# Patient Record
Sex: Female | Born: 1985 | Race: White | Hispanic: No | Marital: Single
Health system: Midwestern US, Academic
[De-identification: ages and names within clinical notes are randomized; demographics above are authoritative.]

## PROBLEM LIST (undated history)

## (undated) DIAGNOSIS — Z803 Family history of malignant neoplasm of breast: Secondary | ICD-10-CM

## (undated) DIAGNOSIS — E559 Vitamin D deficiency, unspecified: Secondary | ICD-10-CM

## (undated) DIAGNOSIS — B019 Varicella without complication: Secondary | ICD-10-CM

## (undated) DIAGNOSIS — G43909 Migraine, unspecified, not intractable, without status migrainosus: Secondary | ICD-10-CM

## (undated) DIAGNOSIS — K219 Gastro-esophageal reflux disease without esophagitis: Secondary | ICD-10-CM

## (undated) DIAGNOSIS — F329 Major depressive disorder, single episode, unspecified: Secondary | ICD-10-CM

## (undated) DIAGNOSIS — F419 Anxiety disorder, unspecified: Secondary | ICD-10-CM

## (undated) DIAGNOSIS — F32A Depression, unspecified: Secondary | ICD-10-CM

## (undated) DIAGNOSIS — Z800 Family history of malignant neoplasm of digestive organs: Secondary | ICD-10-CM

## (undated) DIAGNOSIS — M1990 Unspecified osteoarthritis, unspecified site: Secondary | ICD-10-CM

## (undated) HISTORY — DX: Varicella without complication: B01.9

## (undated) HISTORY — DX: Depression, unspecified: F32.A

## (undated) HISTORY — DX: Vitamin D deficiency, unspecified: E55.9

## (undated) HISTORY — DX: Anxiety disorder, unspecified: F41.9

## (undated) HISTORY — DX: Major depressive disorder, single episode, unspecified: F32.9

## (undated) HISTORY — DX: Family history of malignant neoplasm of digestive organs: Z80.0

## (undated) HISTORY — DX: Family history of malignant neoplasm of breast: Z80.3

## (undated) HISTORY — DX: Migraine, unspecified, not intractable, without status migrainosus: G43.909

## (undated) HISTORY — PX: WISDOM TOOTH EXTRACTION: SHX21

---

## 2009-04-29 ENCOUNTER — Observation Stay: Payer: Self-pay

## 2009-05-20 ENCOUNTER — Inpatient Hospital Stay (HOSPITAL_COMMUNITY): Admission: AD | Admit: 2009-05-20 | Discharge: 2009-05-21 | Payer: Self-pay | Admitting: Obstetrics and Gynecology

## 2009-06-01 ENCOUNTER — Inpatient Hospital Stay: Payer: Self-pay

## 2009-11-24 LAB — OFFICE VISIT LAB RESULTS

## 2009-11-24 NOTE — Unmapped (Signed)
Signed by Paulla Dolly RN on 11/24/2009 at 18:19:54    PHONE  NNOB        Allergies  Allergies have not been documented for this patient    Pain:     Have you had pain other than everyday aches and pains (e.g., mild headache, back ache, strains) in the past week?   No    Intake recorded by: Paulla Dolly RN on November 24, 2009 9:54 AM     Communication Sensory   Primary Language English     Functional Screen/Fall Risk Assessment   In the past two months, patient has experienced:  1.  A decreased ability to walk, turn in bed, get in/out of a chair? No  2.  Decreased ability to care for self, perform routine tasks? No  3.  Recent problem with coordination/movement or loss of balance? No  4.  Use of ambulation device such as walker/cane or crutches? No  5.  Weakness, dizziness, shortness of breath, fatigue with activity? No  6.  Recent frequent history of falling? No  7.  Do you exercise? Yes  How often? walking     Nutritional Screen   1.  Do you eat a special diet or meal plan? No2.  Have you had a recent weight gain or loss of 10 lbs (in the past 2 months?) No  3.  Do you have a difficulty eating, chewing, swallowing or speaking? No     Emotional Psychosocial Spiritual   1.  Do you have any spiritual, religious or cultural rituals that we need to be aware of? No  3.  Do you have current, recent thoughts that you would be better off dead or of hurting yourself? No  6.  Do you have adequate resources/medications? Yes  8.  Household: Other-FIANCEE AND 3 CHILDREN     Abuse/Neglect   Due to the increase in domestic violence, we ask all patients:  1.  Have you recently been threatened, frightened, mistreated, hurt or hit by anyone in your life? No  2.  Have you had money or other items taken from you without your permission? No  3.  If yes, to any of the above, do you want help? No      Educational Knowledge   Understanding of current health problems: Knowledgeable  Learning Preference: Listening, Reading, Seeing,  Doing    Referrals    Given social worker name and phone number  Questionnaire completed: 11/24/2009    Signed by:  Paulla Dolly RN on November 24, 2009 9:59 AM        Menses History:   [facility] Delivery[facility]  First day LMP: 09/22/2009  This date is: definite  Menses monthly: yes  Frequency:  32 days  Menarche: 10  LMP duration/flow: typical  Contraception used at conception: None  Planned pregnancy: no  Pregnancy confirmed by: home pregnancy test  G: 4  P 3:     Term: 3   Preterm: 0   Ab: 0   # Living Children: 3  SAB: 0  EAB: 0  ECT: 0  Past History  Surgical History:  2-Wisdom teeth removed 2010, Cesarean Section: x3    Family History: MGM - Heart Dz  passed HTN,aorta valve problem,Altermeizers  MGF - Aneurysim Brain,HTN passed away  Aunt - 1-pSYCH ISSUES  Uncle - 2- have had Open Heart Surgery 2- HTN  Social History: Preferred Language: English,   Marital Status: single,   Spouse: FOB Mark  Davis  Children: 3,   Employment Status: homemaker,   Patient Lives at: apartment,   Support System: excellent  Exercise: walking,   Caffeine per Day: <1  Seatbelt Use: 100 % of the time  Alcohol Use: none  Drug Use: none  Tobacco Usage:prior smoker  Sexually Active: Yes      Other Pertinent History:   Abuse: No  Pre-Pregnacy Tobacco Use per Day: 0  Pregnancy Tobacco Use per Day: 0  Pre-Pregnacy Alcohol Use per Day: 0  Pregnancy Alcohol Use per Day: 0  Pre-Pregnancy Illicit/Recreational Drug Use per Day: 0  Pregnancy Illicit/Recreational Drug Use per Day: 0  D(Rh) Sensitized: No  Latex Allergy: No  Betadine Allergy: No  Anesthetic Complications No  History of Abnormal Pap: No  Uterine Anomaly: No  History of Infertility: No  DES Exposure: No  ART Treatment: No      Infection History:   Exposed to TB: no   History of Genital Herpes: no   Sexual partner w/hx of oral or genital HSV: no  Rash or Viral Illness since LMP: no  Hepatitis B: no  Vaccinated for Hepatits B: no  Hepatitis C: no  History of Varicella Vaccine:  no  History of Varicella Infection: no  History of STD: no  History of PID: no  Do you care for any cats: no    Genetic Testing/Teratology Counseling:   Patient's Age>= 35 Years as of Estimated Date of Delivery: no  Thalassemia Risk: no  Neural Tube Defect: no  Congenital Heart Defect: no   Down Syndrome: no  Tay-Sachs: no  Canavan Disease: no  Familial Dysautonomia: no  Sickle Cell Disease/Trait: no  Hemophilia/Blood Disorders: no  Muscular Dystrophy: no  Cystic Fibrosis: no  Huntington's Chorea: no  Mental Retardation/Autism no  Tested for Fragile X: no  Other Genetic or Chromosomal: no  Maternal Metabolic Disorder: no  Other birth defects: no  Recurrent Pregnancy Loss/Stillbirth: no  Medications/Drugs/Alcohol since LMP: no  Previous Genetic testing for Cystic Fibrosis: no  Previous Genetic testing father of baby: no    Past Pregnancies:  G:  4 P:  3 A:  0  SAB: 0  EAB:  0  Term Deliveries:  3  Premature Deliveries:   Ectopic pregnancies:    H/O Multi-fetal Delivery:      Preg # Date Early Loss/ Termination GA Wks Lgth of Labor Brth Wt Sex Type of Del Anes Pl of Del PTL Fetal Comp Matrnl Comp Cmnts   1 03/04/02  FT 16HRS 7-4 F c-section epidural [employer] yes/brethine none No progress    2 12/15/03  39  8-6 M c-section spinal [employer] no none none    3 76/6/09  36  6-1 F c-section spinal [address] yes none none    4 current                     EDD:   LMP: 09/22/2009          Assessment   24 Years Old Female      G: 4  P 3:     Term: 3   Preterm: 0   Ab: 0   # Living Children: 3  SAB: 0  EAB: 0  ECT: 0  New Problems:   PREVIOUS C-SECT DELIVERY X3 (CBJ-628.31)  PREGNANCY, MULTIGRAVIDA (ICD-V22.1)        Patient Education   Education was provided to: patient  1st Trimester Education:  HIV/Prenatal tests, prenatal history risk factors, course of prenatal care, exercise/nutrition/wt gain, toxoplasmosis (cat/raw meat), sexual activity, exercise, smoking counseling, environment/work hazards, travel/seatbelts,  tobacco/alcohol/drugs, use of medications, indications for ultrasound, domestic violence, childbirth classes, hospital facilities        RN  Intake.  86yr G P Ab with LMP 09/22/09 approx 9wks today  EDD 06/29/10  Hx C/Sx3 will obtain Records when pt comes for Visit  and sign ROR.  RN Intake Forms Completed.  Danger Signs of Pregnancy reviewed  Food high in Calcium,  Heartburn,Nausea and Vomiting  Normal Growth of the Baby   Video taping,[facility] Visiting Policy and   Welcome to [facility]  given to pt.  All Prenatal Labs,Pap and Cultures at Provider  visit   scheduled 11/25/09 8.00am  Emergency phone #s and Location of L&D map given  Questions answered.   ..................................................................Marland KitchenPaulla Dolly RN  November 24, 2009 6:12 PM           Patient Education   Education was provided to: patient  Patient Response: Expressed understanding  1st Trimester Education:  HIV/Prenatal tests, prenatal history risk factors, course of prenatal care, exercise/nutrition/wt gain, toxoplasmosis (cat/raw meat), sexual activity, exercise, smoking counseling, environment/work hazards, travel/seatbelts, tobacco/alcohol/drugs, use of medications, indications for ultrasound, domestic violence, childbirth classes, hospital facilities

## 2009-11-24 NOTE — Unmapped (Signed)
Signed by Maris Berger CNM on 11/24/2009 at 00:00:00  To Receive Records FR: [facility] (REQUEST ONLY)      Imported By: Georgianne Fick 11/25/2009 18:16:03    _____________________________________________________________________    External Attachment:    Please see [facility] EMR for this document.

## 2009-11-24 NOTE — Unmapped (Signed)
Signed by Maris Berger CNM on 11/24/2009 at 00:00:00  [facility] AND VIDEOTAPING POLICY       Imported By: Georgianne Fick 11/30/2009 16:52:52    _____________________________________________________________________    External Attachment:    Please see [facility] EMR for this document.

## 2009-11-25 ENCOUNTER — Inpatient Hospital Stay

## 2009-11-25 LAB — HEMOGLOBINOPATHY EVALUATION
Hgb A2 Quant: 2.2 % (ref 0.7–3.1)
Hgb A: 97.8 % (ref 94.0–98.0)
Hgb C: 0 %
Hgb F Quant: 0 % (ref 0.0–2.0)
Hgb S: 0 %

## 2009-11-25 LAB — POC URINALYSIS
Bilirubin Urine: NEGATIVE
Glucose, UA: NEGATIVE g/dL
Ketones, UA: NEGATIVE
Leukocytes, UA: NEGATIVE
Nitrite, UA: POSITIVE
RBC, UA: NEGATIVE
Specific Gravity, UA: >=1.03 (ref 1.005–1.035)
Urobilinogen, UA: 0.2 E units/dL (ref 0.2–1.0)
pH, UA: 6 (ref 5.0–8.0)

## 2009-11-25 LAB — CBC
Hematocrit: 38.6 % (ref 35.0–45.0)
Hemoglobin: 13.2 g/dL (ref 11.7–15.5)
MCH: 28.2 pg (ref 27.0–33.0)
MCV: 82.2 fL (ref 80.0–100.0)
MPV: 8.8 fL (ref 7.5–11.5)
Platelets: 219 10*3/uL (ref 140–400)
RBC: 4.69 10*6/uL (ref 3.80–5.10)
RDW: 16 % (ref 11.0–15.0)
WBC: 7.5 10*3/uL (ref 3.8–10.8)

## 2009-11-25 LAB — ABO/RH: Rh Type: POSITIVE

## 2009-11-25 LAB — HIV 1+2 ANTIBODY/ANTIGEN WITH REFLEX: HIV-1/HIV-2 Ab: NEGATIVE

## 2009-11-25 LAB — ANTIBODY SCREEN: Antibody Screen: NEGATIVE

## 2009-11-25 LAB — RPR W TITER: RPR: NONREACTIVE

## 2009-11-25 LAB — RUBELLA IMMUNE STATUS: Rubella Antibodies, IgG: IMMUNE

## 2009-11-25 LAB — GTT60 MIN: Glucose, GTT 1 HR: 137 mg/dL

## 2009-11-25 LAB — HEPATITIS B SURFACE ANTIGEN: Hep B Surface Ag: NONREACTIVE

## 2009-11-25 NOTE — Unmapped (Signed)
Signed by Maris Berger CNM on 11/26/2009 at 08:09:10  Patient: Diane Stafford  Note: All result statuses are Final unless otherwise noted.    Tests: (1) HEPATITIS B SURFACE ANTIGEN (HBSAG)    Order Note: Performing Site: [facility] [address]   [place] [address].    HBsAg Confirm             NON-REACTIVE                NON-REACTIVE    Note: An exclamation mark (!) indicates a result that was not dispersed into   the flowsheet.  Document Creation Date: 11/25/2009 5:54 PM  _______________________________________________________________________    (1) Order result status: Final  Collection or observation date-time: 11/25/2009 10:51  Requested date-time: 11/25/2009 10:51  Receipt date-time: 11/25/2009 17:54  Reported date-time: 11/25/2009 17:54  Referring Physician: Nelida Gores REFERRAL PT  Ordering Physician: Cresenciano Lick [facility])  Specimen Source: S&SERUM     SST V&SST (4 ml S Refrig)  Source: [name] Order Number: [id number]  Lab site: [facility]      [address]      [place] [address]  [phone]

## 2009-11-25 NOTE — Unmapped (Signed)
Signed by Maris Berger CNM on 11/27/2009 at 18:08:53  Patient: Diane Stafford  Note: All result statuses are Final unless otherwise noted.    Tests: (1) HEMOGLOBINOPATHY (HGBE)    Order Note: [address] - [address]  [place], [place] [id number]   [id number] CLIA #:[id number]    Hgb A                     97.8 %                      94.0-98.0    Hgb S                     0.0 %                       0.0    Hgb C                     0.0 %                       0.0    Hgb A2                    2.2 %                       0.7-3.1    Hgb F                     0.0 %                       0.0-2.0    Interpretation            Comment      Normal adult hemoglobin present.    Note: An exclamation mark (!) indicates a result that was not dispersed into   the flowsheet.  Document Creation Date: 11/26/2009 3:15 PM  _______________________________________________________________________    (1) Order result status: Final  Collection or observation date-time: 11/25/2009 10:51  Requested date-time: 11/25/2009 10:51  Receipt date-time: 11/25/2009 18:42  Reported date-time: 11/26/2009 15:15  Referring Physician: Nelida Gores REFERRAL PT  Ordering Physician: Cresenciano Lick [facility])  Specimen Source: WB&WHOLE BLOOD     LVWB1&LAV (1mL WB Refrig)  Source: [name] Order Number: [id number]  Lab site: [facility]      [address]      [place] [address]  [phone]

## 2009-11-25 NOTE — Unmapped (Signed)
Signed by Maris Berger CNM on 11/26/2009 at 16:10:96  OB US      Imported By: Georgianne Fick 11/25/2009 13:21:21    _____________________________________________________________________    External Attachment:    Please see [facility] EMR for this document.

## 2009-11-25 NOTE — Unmapped (Signed)
Signed by Maris Berger CNM on 11/26/2009 at 08:09:10    PERINATAL VISIT      Reason for Visit   Chief Complaint: New OB Intake Visit  History from: patient    Allergies  Allergies have not been documented for this patient    Medications  PRENATAL VITAMINS  TABS (PRENATAL MV & MIN W/FE-FA TABS)         Vital Signs   Weight: 215.2 lbs.   Pulse rate: 84   Temperature: 98.4 degrees  F   Blood Pressure   BP #1: 123 / 83mm Hg  Cuff Size: Std   Intake recorded by: Dwana Melena MA on November 25, 2009 9:37 AM    Comments: BP taken on lower part Lt arm                   Menses History:   [facility] of Delivery: [facility]  First day LMP: 09/22/2009  This date is: approximate  Menses monthly: yes  Frequency:  32 days  Menarche: 10  Contraception used at conception: None  Planned pregnancy: no  Pregnancy confirmed by: home pregnancy test  G: 4  P 3:     Term: 3   Preterm: 0   Ab: 0   # Living Children: 3  SAB: 0  EAB: 0  ECT: 0    Other Pertinent History:   Pre-Pregnacy Tobacco Use per Day: 0  Pregnancy Tobacco Use per Day: 0  Pre-Pregnacy Alcohol Use per Day: 0  Pregnancy Alcohol Use per Day: 0  Pre-Pregnancy Illicit/Recreational Drug Use per Day: 0  Pregnancy Illicit/Recreational Drug Use per Day: 0  D(Rh) Sensitized: No  Latex Allergy: No  Betadine Allergy: No  Anesthetic Complications No  History of Abnormal Pap: No  Uterine Anomaly: No  History of Infertility: No  DES Exposure: No  ART Treatment: No      Infection History:   Exposed to TB: no   History of Genital Herpes: no   Sexual partner w/hx of oral or genital HSV: no  Rash or Viral Illness since LMP: no  Hepatitis B: no  Vaccinated for Hepatits B: no  Hepatitis C: no  History of Varicella Vaccine: no  History of Varicella Infection: no  History of STD: no  History of PID: no  Do you care for any cats: no    Genetic Testing/Teratology Counseling:   Patient's Age>= 35 Years as of Estimated Date of Delivery: no  Thalassemia Risk: no  Neural Tube Defect:  no  Congenital Heart Defect: no   Down Syndrome: no  Tay-Sachs: no  Canavan Disease: no  Familial Dysautonomia: no  Sickle Cell Disease/Trait: no  Hemophilia/Blood Disorders: no  Muscular Dystrophy: no  Cystic Fibrosis: no  Huntington's Chorea: no  Mental Retardation/Autism no  Tested for Fragile X: no  Other Genetic or Chromosomal: no  Maternal Metabolic Disorder: no  Other birth defects: no  Recurrent Pregnancy Loss/Stillbirth: no  Medications/Drugs/Alcohol since LMP: no  Previous Genetic testing for Cystic Fibrosis: no  Previous Genetic testing father of baby: no    Past Pregnancies:  G:  4 P:  3 A:  0  SAB: 0  EAB:  0  Term Deliveries:  3  Premature Deliveries:   Ectopic pregnancies:    H/O Multi-fetal Delivery:      Preg # Date Early Loss/ Termination GA Wks Lgth of Labor Brth Wt Sex Type of Del Anes Pl of Del PTL Fetal Comp Matrnl Comp  Cmnts   1 03/04/02  FT 16HRS 7-4 F c-section epidural [facility] yes/brethine none No progress    2 12/15/03  39  8-6 M c-section spinal [facility] no none none    3 76/6/09  36  6-1 F c-section spinal [address] yes none none    4 current                     EDD:   LMP: 09/22/2009    Today's EGA in weeks: 11    History of Present Illness:   Uplanned but accepted. FOB same as baby #1 and #3  Pt states lmp was shorter than usual, a little more than spotting,  swear I feel fetal movement  Desires BTL      Symptoms:   Denies: back pain, change in discharge, constipation, fatigue, headache, heartburn, nausea, vomiting, pelvic pressure, preterm labor symptoms, swelling, uterine activity, vaginal bleeding, loss of fluid  Comments: nausea resolving    Prenatal Exam:     Weight      Exam   Est. Gestational Age: 35  FHR: --  Cervical dilation: cl  Cervical effacement: thick    List of Diagnoses/Issues:   - h/o C/S X 3  - Desires BTL  - BMI 41  - UTI    Plan of Care:   - Obtain op report  - medical management  - early GCT  - Rx Macrobid    PHYSICAL EXAMINATION  Constitutional: alert,  no acute distress, well hydrated, well developed, well nourished;    Neck: no thyromegaly;    Chest: clear to auscultation;    Breast:  breast symmetrical, no tenderness, no dimpling, no masses, no erythema, no axillary adenopathy, normal aerola bilaterally, no nipple discharge, no inversion;    Cardiac:  regular rate and rhythm;    External Genitalia:  no lesions, normal estrogen effect, normal hair pattern, no clitorimegaly, no erythema, normal perineal body;    Urethra:  normal appearance, no erythema, no tenderness, normal urethral meatus;    Vagina:  no discharge, no lesions, no masses, no cystocele, no rectocele, normal support, normal rugations;    Cervix:  no discharge, no cervical motion tenderness;    Uterus:  difficult to assess due to maternal habitus.    Back: no cva tenderness;    Extremities: no edema, no lesions;    Neuro: normal mental status;    Psych: affect and mood appropriate;           Assessment   24 Years Old Female      G: 4  P 3:     Term: 3   Preterm: 0   Ab: 0   # Living Children: 3  SAB: 0  EAB: 0  ECT: 0  GA: 11 weeks  New Problems:   UTI (ICD-599.0)      Status of Existing Problems  Assessed PREGNANCY, MULTIGRAVIDA as comment only - Prenatal vitamins  Prenatal labs  GCT  PE  Pap, GC/CT  Sono for dating  Desires NT - Maddyx Vallie M Lorian Yaun CNM - Signed  Assessed UTI as comment only - Rx Macrobid 100 mg twice a day X 7 days - Maris Berger CNM - Signed  Assessed PREVIOUS C-SECT DELIVERY X3 as comment only - Medical management - Maris Berger CNM - Signed    Medications   New Prescriptions/Refills:  MACROBID 100 MG CAPS (NITROFURANTOIN MONOHYD MACRO) one by mouth twice a day  X 7days  #14 x 0, 11/25/2009, Maris Berger CNM      Today's Orders   ABO / RH  [facility]) [phone])  [*CPT-86900/86901]  RPR qualitative w reflex  [phone])  [CPT-80055]  HIV Antibody    (HIVR) [phone]) [BMW-41324]  Antibody Screen     (ABS) (795)  [*CPT-86850]  Hemoglobin Electrophoresis (HGBE) (40102) [CPT-83020]  Rubella  Screen   (RUB) (802)  [CPT-86762]  Urine Culture   (UC) (395) [CPT-87088]  Hepatitis B Surface Antigen   (HBSAG) (498)  [CPT-86287]  CBC w/o Differential (1759) [*CPT-85027]  Glucose, Postprand  1 hr  (GCH1HR) (8477) [CPT-82950]  Cytology, Pap / HPV Reflex / GC / Chlamydia (ThinPrep) (72536) [IMS-11111]  1st Trimester [place], Dating and/or Viability [CPT-76801]    Disposition:   Clinic: Resident COC  Return to clinic for Provider Visit in 4 Appointment Reason: ROB    *Patient Care Summary printed and given to patient.      Patient Education   1st Trimester Education:  HIV/Prenatal tests, prenatal history risk factors, course of prenatal care, exercise/nutrition/wt gain, toxoplasmosis (cat/raw meat), sexual activity, exercise, smoking counseling, environment/work hazards, travel/seatbelts, tobacco/alcohol/drugs, use of medications, indications for ultrasound, domestic violence, childbirth classes, hospital facilities        RN Exit  [redacted]wks Gestation today  RTC 4 wks Medical Management  U/S done today   All Labs Pap and Cultures done today  1-hr GCT done today  Signed up [facility] 11   01/06/10  Ist Trimester Screening scheduled 12/09/09 2.00pm  Fetal Movement   Danger/labor signs  Phone #s given  Location of L and D  Plan reviewed and questions answered.  ..................................................................Marland KitchenPaulla Dolly RN  November 25, 2009 3:23 PM

## 2009-11-25 NOTE — Unmapped (Signed)
Signed by Maris Berger CNM on 11/26/2009 at 08:09:10  Patient: Diane Stafford  Note: All result statuses are Final unless otherwise noted.    Tests: (1) RPR w FTA reflex (RPR)    Order Note: [address] - [address]  [place], [place] [id number]   [id number] CLIA #:[id number]    RPR w reflex              Non Reactive                Non Reactive    Note: An exclamation mark (!) indicates a result that was not dispersed into   the flowsheet.  Document Creation Date: 11/26/2009 4:12 AM  _______________________________________________________________________    (1) Order result status: Final  Collection or observation date-time: 11/25/2009 10:51  Requested date-time: 11/25/2009 10:51  Receipt date-time: 11/25/2009 18:42  Reported date-time: 11/26/2009 04:11  Referring Physician: Nelida Gores REFERRAL PT  Ordering Physician: Cresenciano Lick [facility])  Specimen Source: S&SERUM     SST P&SST (2 ml S Rm Tmp)  Source: [name] Order Number: [id number]  Lab site: [facility]      [address]      [place] [address]  [phone]

## 2009-11-25 NOTE — Unmapped (Signed)
Signed by Maris Berger CNM on 11/26/2009 at 08:10:10  Patient: Diane Stafford  Note: All result statuses are Final unless otherwise noted.    Tests: (1) CBC (CBC)    WBC                       7.5 10*3/uL                 3.8-10.8    RBC                       4.69 10*6/uL                3.80-5.10    Hgb                       13.2 g/dL                   95.6-21.3    HCT                       38.6 %                      35.0-45.0    MCV                       82.2 fL                     80.0-100.0    MCH                       28.2 pg                     27.0-33.0    MCHC                      34.3 g/dL                   08.6-57.8    RDW                  [H]  16.0 %                      11.0-15.0    Platelet count            219 10*3/uL                 140-400    MPV                       8.8 fL                      7.5-11.5    Note: An exclamation mark (!) indicates a result that was not dispersed into   the flowsheet.  Document Creation Date: 11/25/2009 12:28 PM  _______________________________________________________________________    (1) Order result status: Final  Collection or observation date-time: 11/25/2009 10:51  Requested date-time: 11/25/2009 10:51  Receipt date-time: 11/25/2009 12:20  Reported date-time: 11/25/2009 12:28  Referring Physician: SELECTED REFERRAL PT  Ordering Physician: Cresenciano Lick (BELCHED)  Specimen Source: WB&WHOLE BLOOD     LAV K&LAV (3 ml WB Rm Tmp)  Source: [name] Order Number: [id number]  Lab site: [facility]  [address]      [place] [address]  [phone]      -----------------    The following results were not dispersed to the flowsheet  because of errors during the import process:      MCHC, 34.3 g/dL, (F)

## 2009-11-25 NOTE — Unmapped (Signed)
Signed by   [employer] on 11/25/2009 at 09:42:46  Patient: Diane Stafford  Note: All result statuses are Final unless otherwise noted.    Tests: (1) POC HCG QUALITATIVE, URINE (POCPREG)  ! hCG Qualitative      [A]  Positive                    Negative    Note: An exclamation mark (!) indicates a result that was not dispersed into   the flowsheet.  Document Creation Date: 11/25/2009 9:42 AM  _______________________________________________________________________    (1) Order result status: Final  Collection or observation date-time: 11/25/2009 09:29  Requested date-time: 11/25/2009 09:29  Receipt date-time: 11/25/2009 09:00  Reported date-time: 11/25/2009 09:42  Referring Physician: Nelida Gores REFERRAL PT  Ordering Physician: Smitty Pluck PT [phone])  Specimen Source: U&URINE     UACUP&URINALYSIS CONTAINER  Source: Butler Denmark Order Number: [id number]  Lab site: [facility]      [address]      [place] [address]  [phone]

## 2009-11-25 NOTE — Unmapped (Signed)
Signed by Maris Berger CNM on 11/27/2009 at 18:14:23  Patient: Diane Stafford  Note: All result statuses are Final unless otherwise noted.    Tests: (1) URINE CULTURE (UC)  ! Clinical Report           FINAL Report      Specimen: MIDSTREAM URINE  Collected: 11/25/2009 10:30     Status: Final      Last Updated: 11/27/2009 14:03                Culture Result: (Final)      >100,000 cfu/mL      Escherichia coli      Note: Piperacillin-Tazobactam for this organism is not automatically      reported.  Please contact the [facility] Lab [phone]) within 5 days if      information on this drug-organism combination is needed.       Organism 2: (Final)      Mixed Skin/Urogenital Flora.  No Further Workup.                               Culture Result:                            Escherichia coli                            --------------------------------    MIC(mcg/ml)      Ampicillin            >=32       Resistant      Amp/Sulbactam         16         Intermediate      Cefazolin             <=4        Susceptible      Ceftriaxone           <=1        Susceptible      Cefepime              <=1        Susceptible      Imipenem              <=1        Susceptible      Ciprofloxacin         <=0.25     Susceptible      Gentamicin            <=1        Susceptible      Tobramycin            <=1        Susceptible      Nitrofurantoin        <=16       Susceptible      Sulfa/Trimeth         >=320      Resistant          Note: An exclamation mark (!) indicates a result that was not dispersed into   the flowsheet.  Document Creation Date: 11/27/2009 2:11 PM  _______________________________________________________________________    (1) Order result status: Final  Collection or observation date-time: 11/25/2009 10:30  Requested date-time: 11/25/2009 10:30  Receipt date-time: 11/25/2009 22:25  Reported date-time: 11/27/2009 14:03  Referring Physician: SELECTED REFERRAL PT  Ordering Physician: Cresenciano Lick [facility])  Specimen Source:  UM&MIDSTREAM URINE     UGRS&Boric Acid, Small Urine Tube  Source: [id number]  Filler Order Number: [id number]  Lab site: [facility]      [address]      [place] [address]  [phone]

## 2009-11-25 NOTE — Unmapped (Signed)
Signed by   LinkLogic on 11/25/2009 at 09:31:48  Patient: Diane Stafford  Note: All result statuses are Final unless otherwise noted.    Tests: (1) POC URINALYSIS (POCUA)    Glucose, Urine            Negative mg/dL              Negative    Bilirubin, Urine          Negative                    Negative    Ketone                    Negative mg/dL              Negative    Specific Gravity          >=1.030                     1.005-1.035    Blood                     Negative                    Negative    pH                        6.0                         5.0-8.0    Protein, urine       [A]  Trace mg/dL                 Negative    Urobilinogen              0.2 mg/dL                   3.6-6.4    Nitrite              [A]  Positive                    Negative    Leukocyte Esterase        Negative                    Negative    Note: An exclamation mark (!) indicates a result that was not dispersed into   the flowsheet.  Document Creation Date: 11/25/2009 9:31 AM  _______________________________________________________________________    (1) Order result status: Final  Collection or observation date-time: 11/25/2009 09:28  Requested date-time: 11/25/2009 09:28  Receipt date-time: 11/25/2009 08:48  Reported date-time: 11/25/2009 09:31  Referring Physician: Nelida Gores REFERRAL PT  Ordering Physician: Smitty Pluck PT [phone])  Specimen Source: U&URINE     UACUP&URINALYSIS CONTAINER  Source: Butler Denmark Order Number: [id number]  Lab site: [facility]      [address]      [place] [address]  [phone]      -----------------    The following lab values were dispersed to the flowsheet  with no units conversion:      Urobilinogen, 0.2 MG/DL, (F)  expected units: E units/dL    -----------------    The following non-numeric lab results were dispersed to  the flowsheet even though numeric results were expected:      Glucose, Urine, Negative  Specific Gravity, >=1.030

## 2009-11-25 NOTE — Unmapped (Signed)
Signed by Maris Berger CNM on 11/26/2009 at 08:10:40  Patient: Diane Stafford  Note: All result statuses are Final unless otherwise noted.    Tests: (1) GLUCOSE CHALLENGE, 1 HOUR (BMW4XL)    Order Note: Due at 10:51    Glucose, 1 Hr             137 mg/dL        1 HOUR GLUCOSE CHALLENGE INTERPRETATION:     Pregnant Women/ Non Pregnant Adult - 50 gram glucose load screening for   gestational diabetes      Expected normal response: A one hour glucose result of less than 140   mg/dL.    Note: An exclamation mark (!) indicates a result that was not dispersed into   the flowsheet.  Document Creation Date: 11/25/2009 12:50 PM  _______________________________________________________________________    (1) Order result status: Final  Collection or observation date-time: 11/25/2009 10:51  Requested date-time: 11/25/2009 10:51  Receipt date-time: 11/25/2009 12:20  Reported date-time: 11/25/2009 12:49  Referring Physician: Nelida Gores REFERRAL PT  Ordering Physician: Cresenciano Lick [facility])  Specimen Source: S&SERUM     SST  ROOM T&SST (serum Rm Tmp)  Source: [name] Order Number: [id number]  Lab site: [facility]      [address]      [place] [address]  [phone]

## 2009-11-25 NOTE — Unmapped (Signed)
Signed by Maris Berger CNM on 11/26/2009 at 08:09:10  Patient: Diane Stafford  Note: All result statuses are Final unless otherwise noted.    Tests: (1) ABO GROUP & RH TYPE ([facility])    ABO Group                 O    Rh Type                   Positive    Note: An exclamation mark (!) indicates a result that was not dispersed into   the flowsheet.  Document Creation Date: 11/25/2009 1:40 PM  _______________________________________________________________________    (1) Order result status: Final  Collection or observation date-time: 11/25/2009 12:38  Requested date-time: 11/25/2009 12:38  Receipt date-time: 11/25/2009 12:38  Reported date-time: 11/25/2009 13:40  Referring Physician: Nelida Gores REFERRAL PT  Ordering Physician: Cresenciano Lick [facility])  Specimen Source: B&BLOOD     PINK6&PINK (6ml Plastic EDTA 13x130mm)  Source: [name] Order Number: [id number]  Lab site: [facility]      [address]      [place] [address]  [phone]

## 2009-11-25 NOTE — Unmapped (Signed)
Signed by Maris Berger CNM on 11/26/2009 at 08:09:40  Patient: Diane Stafford  Note: All result statuses are Final unless otherwise noted.    Tests: (1) HIVR (HIV1AND2) (HIVR)  ! HIV 1/O/2 Abs-Index       <1.00 INDEX                 <1.00    HIV 1/O/2 Abs, Qual       Negative                    Negative    Note: An exclamation mark (!) indicates a result that was not dispersed into   the flowsheet.  Document Creation Date: 11/25/2009 1:31 PM  _______________________________________________________________________    (1) Order result status: Final  Collection or observation date-time: 11/25/2009 10:51  Requested date-time: 11/25/2009 10:51  Receipt date-time: 11/25/2009 12:20  Reported date-time: 11/25/2009 13:31  Referring Physician: Nelida Gores REFERRAL PT  Ordering Physician: Cresenciano Lick [facility])  Specimen Source: S&SERUM     SST UNOPEN&SST unopened (4 ml S Refrig)  Source: [name] Order Number: [id number]  Lab site: [facility]      [address]      [place] [address]  [phone]

## 2009-11-25 NOTE — Unmapped (Signed)
Signed by Maris Berger CNM on 11/26/2009 at 08:09:10  Patient: Diane Stafford  Note: All result statuses are Final unless otherwise noted.    Tests: (1) RUBELLA IMMUNE STATUS (RUB)    Order Note: Performing Site: [facility] [address]   [place] [address].    Rubella IGG AB            IMMUNE IU/mL                IMMUNE      These results were obtained with the [employer] Rubella IgG assay.      Results from assays of other manufacturers cannot be used      interchangeably.    Note: An exclamation mark (!) indicates a result that was not dispersed into   the flowsheet.  Document Creation Date: 11/25/2009 5:54 PM  _______________________________________________________________________    (1) Order result status: Final  Collection or observation date-time: 11/25/2009 10:51  Requested date-time: 11/25/2009 10:51  Receipt date-time: 11/25/2009 17:54  Reported date-time: 11/25/2009 17:54  Referring Physician: Nelida Gores REFERRAL PT  Ordering Physician: Cresenciano Lick [facility])  Specimen Source: S&SERUM     SST G&SST (1 ml Serum/test)  Source: [name] Order Number: [id number]  Lab site: [facility]      [address]      [place] [address]  [phone]

## 2009-11-25 NOTE — Unmapped (Signed)
Signed by Maris Berger CNM on 11/26/2009 at 08:09:10  Patient: Diane Stafford  Note: All result statuses are Final unless otherwise noted.    Tests: (1) ANTIBODY SCREEN (ABS)    Antibody Screen           Negative    Note: An exclamation mark (!) indicates a result that was not dispersed into   the flowsheet.  Document Creation Date: 11/25/2009 1:42 PM  _______________________________________________________________________    (1) Order result status: Final  Collection or observation date-time: 11/25/2009 12:38  Requested date-time: 11/25/2009 12:38  Receipt date-time: 11/25/2009 12:38  Reported date-time: 11/25/2009 13:42  Referring Physician: Nelida Gores REFERRAL PT  Ordering Physician: Cresenciano Lick [facility])  Specimen Source: B&BLOOD     PINK6&PINK (6ml Plastic EDTA 13x126mm)  Source: [name] Order Number: [id number]  Lab site: [facility]      [address]      [place] [address]  [phone]

## 2009-11-26 NOTE — Unmapped (Signed)
Signed by Maris Berger CNM on 11/26/2009 at 16:10:96        EDD:   Pregnancy Dating Information:    LMP: 09/22/2009    EDD by LMP: 06/29/2010  1st [place] date: 11/25/2009 = 9  weeks, 1  days  EDD: 06/29/2010 by LMP    Dating Comments:  EDD 06/29/10 per 9 wk sono = lmp  ..................................................................Marland KitchenMaris Berger CNM  November 26, 2009 8:25 AM      Prenatal Exam:     Weight      List of Diagnoses/Issues:   - h/o C/S X 3  - Desires BTL  - BMI 41  - UTI    Plan of Care:   - Obtain op report  - medical management  - early GCT  - Rx Macrobid

## 2009-12-10 NOTE — Unmapped (Signed)
Signed by Kirstie Mirza RN on 12/10/2009 at 13:49:15    PHONE NOTE  Caller's Cell Phone #: [phone]  Caller: patient    Reason for Call: acute illness. Patient c/o dizziness which was intermittent but has increased to every times she ambulate.  Patient denies skipping meals and no h/o hypoglycemia.  Patient had one hour glucose witch was 137 on 11/25/09.  Patient has vertigo when ambulating from a sitting position with feet on the floor or lying position.  No urgent appts. available in [facility] on 12/10/09 or 12/11/09.   Patient to go to ER for evaluation.      Initial call taken by: M. Lavell Anchors RN,  Dec 10, 2009 1:48 PM

## 2009-12-16 NOTE — Unmapped (Signed)
Signed by   LinkLogic on 12/17/2009 at 08:17:58  Patient: Diane Stafford  Note: All result statuses are Final unless otherwise noted.    Tests: (1)  (MR)    Order Note:                                         [facility]                          MATERNAL FETAL MEDICINE CLINIC           PATIENT NAME:   AVRIL, BUSSER                  MR #:  [id number]  DATE OF BIRTH:  11/23/1985                        ACCOUNT #:  [id number]  PRIMARY:        [name]. [name], M.D.              ROOM #:  REFERRING:      Selected Referral Pt              NURSING UNIT:  DICTATED BY:    Tacey Ruiz, N.P.            FC:  D  VISIT DATE:     12/16/2009                        ADMIT DATE:  11/25/2009                                                    DISCHARGE DATE:                                       CLINIC NOTE     *-*-*  **** INCOMPLETE DICTATION - 12/16/2009 - TMR ****     The patient was seen in the [facility] for a nuchal translucency  screening for Down syndrome.  Unfortunately, 80% of babies born with  undiagnosed chromosomal abnormalities are born to women under the age of 12,  though historically women are lead to believe that the risks are not  significant until she reaches this age.  ACOG now recommends access to  diagnostic testing at all ages, though not all women are willing to risk the  pregnancy loss.  This is where the nuchal translucency allows the benefit of  screening without the complications and is 95% accurate in detection.  The  patient is a G4, P3.     *-*-*                                              _______________________________________  EG/tmr                                 _____  D:  12/16/2009 15:21                   Tacey Ruiz, N.P.  T:  12/16/2009 17:01  Job #:  [id number]                                             CLINIC NOTE                                                               PAGE    1 of   1    Note: An exclamation mark (!) indicates a result that was not dispersed  into   the flowsheet.  Document Creation Date: 12/17/2009 8:17 AM  _______________________________________________________________________    (1) Order result status: Final  Collection or observation date-time: 12/16/2009 00:00  Requested date-time:   Receipt date-time:   Reported date-time:   Referring Physician: Selected Pt  Ordering Physician:  Reviewed In [facility])  Specimen Source:   Source: DBS  Filler Order Number: [id number]  Lab site:

## 2009-12-16 NOTE — Unmapped (Signed)
Signed by Ranae Palms NP on 12/16/2009 at 14:22:42    NT completed. Analytes received/sent and ultrasound completed. Referring provider is Cresenciano Lick CNM and best number to call results to is: [phone]. See dication.  ..................................................................Marland KitchenRanae Palms NP  Dec 16, 2009 2:22 PM     Assessment   24 Years Old Female      G: 4  P 3:     Term: 3   Preterm: 0   Ab: 0   # Living Children: 3  SAB: 0  EAB: 0  ECT: 0       EDD: 06/29/2010    Disposition:   Clinic: Resident [facility]      Patient Education   1st Trimester Education:  HIV/Prenatal tests, prenatal history risk factors, course of prenatal care, exercise/nutrition/wt gain, toxoplasmosis (cat/raw meat), sexual activity, exercise, smoking counseling, environment/work hazards, travel/seatbelts, tobacco/alcohol/drugs, use of medications, indications for ultrasound, domestic violence, childbirth classes, hospital facilities

## 2009-12-16 NOTE — Unmapped (Signed)
[facility]                           MATERNAL FETAL MEDICINE CLINIC         PATIENT NAME:   Diane Stafford, Diane Stafford                  MR #:  [id number]   DATE OF BIRTH:  1985/11/07                        ACCOUNT #:  [id number]   PRIMARY:        [name]. [name], M.D.              ROOM #:   REFERRING:      Selected Referral Pt              NURSING UNIT:   DICTATED BY:    Tacey Ruiz, N.P.            FC:  D   VISIT DATE:     12/16/2009                        ADMIT DATE:  11/25/2009                                                     DISCHARGE DATE:                                       CLINIC NOTE     *-*-*   The patient was seen in the [facility] for a nuchal translucency   screening for Down syndrome.  Unfortunately, 80% of babies born with   undiagnosed chromosomal abnormalities are born to women under the age of 96,   though historically women are lead to believe that the risks are not   significant until she reaches this age.  ACOG now recommends access to   diagnostic testing at all ages, though not all women are willing to risk the   pregnancy loss.  This is where the nuchal translucency allows the benefit of   screening without the complications and is 95% accurate in detection.     The patient is a G4, P3, seen at 12 weeks and one day with an LMP of   09/22/2009 and an EDD of 06/29/2010.  She is 24 years old with a date of birth   of February 03, 1986.  Her age-related risk for Down syndrome is 1:1081.  Her   age-related risk for trisomy 71 and 33 is 1:4212.  I reviewed her past   medical history and her pedigree tree risk.  The patient stated that she did   have some seizures when she was a child but they stopped when she was 87 or 23   years old and that she has not been on medicine since then.     I informed her that the nuchal translucency was a screening tool and not   diagnostic and the only way to know 100% if her baby has any chromosomal   abnormalities is with an  amniocentesis.  The nuchal translucency does not   equate  abnormalities, but only prompts [place] to complete further testing for   additional benefits or for closer surveillance of any complications that may   occur later in pregnancy such as preterm labor, IUGR, IUFD or placental   insufficiency.  In our efforts to minimize amniocentesis, we utilize   contingent screening and depending on the results of the testing, we will   adjust the plan of care for individualized care of your patient.     All of this screening was explained to the patient.  All questions were   answered.  The plan of care will be discussed after the results are received   in about a week.  Analytes were obtained and the ultrasound was completed at   this visit.  All follow-up care and further orders need to be completed by   the primary OB provider.     More than 50% of this 15 minute consult time was spent face-to-face.     Thank you for allowing [place] to care for your patient.     Sincerely,         Ranae Palms, certified nurse practitioner.   *-*-*                                             ______________________________________   EG/tmr                                 ______   D:  12/16/2009 15:38                   Tacey Ruiz, N.P.   T:  12/16/2009 16:27   Job #:  [id number]                                           CLINIC NOTE                                                                PAGE    1 of   1                                                                PAGE    1 of   1

## 2009-12-16 NOTE — Unmapped (Signed)
Signed by   LinkLogic on 12/16/2009 at 16:32:12  Patient: Diane Stafford  Note: All result statuses are Final unless otherwise noted.    Tests: (1)  (MR)    Order Note:                                         [facility]                          MATERNAL FETAL MEDICINE CLINIC           PATIENT NAME:   Diane, Stafford                  MR #:  [id number]  DATE OF BIRTH:  08-Jul-1986                        ACCOUNT #:  [id number]  PRIMARY:        [name]. [name], M.D.              ROOM #:  REFERRING:      Selected Referral Pt              NURSING UNIT:  DICTATED BY:    Tacey Ruiz, N.P.            FC:  D  VISIT DATE:     12/16/2009                        ADMIT DATE:  11/25/2009                                                    DISCHARGE DATE:                                       CLINIC NOTE     *-*-*  The patient was seen in the [facility] for a nuchal translucency  screening for Down syndrome.  Unfortunately, 80% of babies born with  undiagnosed chromosomal abnormalities are born to women under the age of 57,  though historically women are lead to believe that the risks are not  significant until she reaches this age.  ACOG now recommends access to  diagnostic testing at all ages, though not all women are willing to risk the  pregnancy loss.  This is where the nuchal translucency allows the benefit of  screening without the complications and is 95% accurate in detection.     The patient is a G4, P3, seen at 12 weeks and one day with an LMP of  09/22/2009 and an EDD of 06/29/2010.  She is 24 years old with a date of birth  of [birth year].  Her age-related risk for Down syndrome is 1:1081.  Her  age-related risk for trisomy 4 and 106 is 1:4212.  I reviewed her past  medical history and her pedigree tree risk.  The patient stated that she did  have some seizures when she was a child but they stopped when she was 38 or 24  years old and that she has  not been on medicine since then.     I informed her that  the nuchal translucency was a screening tool and not  diagnostic and the only way to know 100% if her baby has any chromosomal  abnormalities is with an amniocentesis.  The nuchal translucency does not  equate abnormalities, but only prompts Diane Stafford to complete further testing for  additional benefits or for closer surveillance of any complications that may  occur later in pregnancy such as preterm labor, IUGR, IUFD or placental  insufficiency.  In our efforts to minimize amniocentesis, we utilize  contingent screening and depending on the results of the testing, we will  adjust the plan of care for individualized care of your patient.     All of this screening was explained to the patient.  All questions were  answered.  The plan of care will be discussed after the results are received  in about a week.  Analytes were obtained and the ultrasound was completed at  this visit.  All follow-up care and further orders need to be completed by  the primary OB provider.     More than 50% of this 15 minute consult time was spent face-to-face.     Thank you for allowing Diane Stafford to care for your patient.     Sincerely,           Ranae Palms, certified nurse practitioner.  *-*-*                                              ______________________________________  EG/tmr                                 ______  D:  12/16/2009 15:38                   Tacey Ruiz, N.P.  T:  12/16/2009 16:27  Job #:  [id number]                                             CLINIC NOTE                                                               PAGE    1 of   1    Note: An exclamation mark (!) indicates a result that was not dispersed into   the flowsheet.  Document Creation Date: 12/16/2009 4:32 PM  _______________________________________________________________________    (1) Order result status: Final  Collection or observation date-time: 12/16/2009 00:00  Requested date-time:   Receipt date-time:   Reported date-time:   Referring Physician:  Selected Pt  Ordering Physician:  [facility])  Specimen Source:   Source: DBS  Filler Order Number: [id number]  Lab site:

## 2009-12-16 NOTE — Unmapped (Signed)
Signed by Ranae Palms NP on 12/17/2009 at 07:34:45  OB US      Imported By: Georgianne Fick 12/16/2009 14:23:28    _____________________________________________________________________    External Attachment:    Please see [employer] EMR for this document.

## 2009-12-17 NOTE — Unmapped (Signed)
[facility]                           MATERNAL FETAL MEDICINE CLINIC         PATIENT NAME:   Diane Stafford, Diane Stafford                  MR #:  [id number]   DATE OF BIRTH:  [birth year]                        ACCOUNT #:  [id number]   PRIMARY:        [name]. [name], M.D.              ROOM #:   REFERRING:      Selected Referral Pt              NURSING UNIT:   DICTATED BY:    Tacey Ruiz, N.P.            FC:  D   VISIT DATE:     12/16/2009                        ADMIT DATE:  11/25/2009                                                     DISCHARGE DATE:                                       CLINIC NOTE     *-*-*   **** INCOMPLETE DICTATION - 12/16/2009 - TMR ****     The patient was seen in the [facility] for a nuchal translucency   screening for Down syndrome.  Unfortunately, 80% of babies born with   undiagnosed chromosomal abnormalities are born to women under the age of 63,   though historically women are lead to believe that the risks are not   significant until she reaches this age.  ACOG now recommends access to   diagnostic testing at all ages, though not all women are willing to risk the   pregnancy loss.  This is where the nuchal translucency allows the benefit of   screening without the complications and is 95% accurate in detection.  The   patient is a G4, P3.     *-*-*                                             _______________________________________   EG/tmr                                 _____   D:  12/16/2009 15:21                   Tacey Ruiz, N.P.   T:  12/16/2009 17:01   Job #:  [id number]  CLINIC NOTE                                                                PAGE    1 of   1   T:  12/16/2009 17:01   Job #:  [id number]                                           CLINIC NOTE                                                                PAGE    1 of   1

## 2009-12-17 NOTE — Unmapped (Signed)
Signed by Ranae Palms NP on 12/17/2009 at 07:34:45      Initial Labs:   Hemoglobin F:  0.0 (11/26/2009 3:15:00 PM)  Hemoglobin A2:  2.2 (11/26/2009 3:15:00 PM)  Hemoglobin C:  0.0 (11/26/2009 3:15:00 PM)  Hemoglobin S:  0.0 (11/26/2009 3:15:00 PM)      [place] REVIEW  US# Date GA EstFetal Wt % Placenta Fluid Anatomy Recommendations Comments   1 11/25/2009 9 1/7       EDD    2 12/16/2009 12 1/7      NT results pending; comprehensive fetal survey 18/20 weeks FHR 150; IUP 12 1/7 weeks with EDD 06/29/10 with NT measurement 1.3   3            4            5            6                       EDD:   LMP: 09/22/2009    EDD by LMP: 06/29/2010  1st [place] date: 11/25/2009 = 9  weeks, 1  days  EDD: 06/29/2010 by LMP    Dating Comments:  EDD 06/29/10 per 9 wk sono = lmp  ..................................................................Marland KitchenMaris Berger CNM  November 26, 2009 8:25 AM      Prenatal Exam:     Weight      List of Diagnoses/Issues:   - h/o C/S X 3  - Desires BTL  - BMI 41  - UTI    Plan of Care:   - Obtain op report  - medical management  - early GCT  - Rx Macrobid

## 2009-12-21 ENCOUNTER — Inpatient Hospital Stay

## 2009-12-21 LAB — OFFICE VISIT LAB RESULTS
Antibody Screen: NEGATIVE
Chlamydia Trachomatis DNA, SDA, Pap Vial: NEGATIVE
Hematocrit: 38.6
Hemoglobin: 13.2
Hep B Surface Ag: NONREACTIVE
MCV: 82.2
Rubella Antibodies: IMMUNE
Urine Culture, Comprehensive: POSITIVE

## 2009-12-21 LAB — POC URINALYSIS
Bilirubin Urine: NEGATIVE
Glucose, UA: NEGATIVE g/dL
Ketones, UA: NEGATIVE
Leukocytes, UA: NEGATIVE
Nitrite, UA: NEGATIVE
Protein, UA: NEGATIVE
RBC, UA: NEGATIVE
Specific Gravity, UA: 1.02 (ref 1.005–1.035)
Urobilinogen, UA: 0.2 E units/dL (ref 0.2–1.0)
pH, UA: 7 (ref 5.0–8.0)

## 2009-12-21 NOTE — Unmapped (Signed)
Signed by Roxanne Mins MD on 12/21/2009 at 17:43:10    PRENATAL VISIT      Reason for Visit   Chief Complaint: Return OB Visit, feeling dizzy and light-headed  History from: patient    Allergies  No Known Allergies    Medications  PRENATAL VITAMINS  TABS (PRENATAL MV & MIN W/FE-FA TABS)         Vital Signs   Height: 61 in.   Weight: 215.0 lbs.   Pre-Pregnancy Weight: 220 lbs.    BMI (in-lb): 40.77   Pulse rate: 87   Temperature: 97.2 degrees  F   Blood Pressure   BP #1: 106 / 79mm Hg  Cuff Size: Std     Pain:     Have you had pain other than everyday aches and pains (e.g., mild headache, back ache, strains) in the past week?   No    Intake recorded by: Hilton Cork MA on Dec 21, 2009 12:53 PM                   Menses History:   Hospital of Delivery: [facility]  First day LMP: 09/22/2009  This date is: approximate  Menses monthly: yes  Frequency:  32 days  Menarche: 10  Contraception used at conception: None  Planned pregnancy: no  Pregnancy confirmed by: home pregnancy test  G: 4  P 3:     Term: 3   Preterm: 0   Ab: 0   # Living Children: 3  SAB: 0  EAB: 0  ECT: 0  Past History  Surgical History (reviewed - no changes required):  2-Wisdom teeth removed 2010, Cesarean Section: x3    Family History (reviewed - no changes required): MGM - Heart Dz  passed HTN,aorta valve problem,Altermeizers  MGF - Aneurysim Brain,HTN passed away  Aunt - 1-pSYCH ISSUES  Uncle - 2- have had Open Heart Surgery 2- HTN  Social History (reviewed - no changes required): Preferred Language: English,   Marital Status: single,   Spouse: FOB Haematologist  Children: 3,   Employment Status: homemaker,   Patient Lives at: apartment,   Support System: excellent  Exercise: walking,   Caffeine per Day: <1  Seatbelt Use: 100 % of the time  Alcohol Use: none  Drug Use: none  Tobacco Usage:prior smoker  Sexually Active: Yes      Other Pertinent History:   Pre-Pregnacy Tobacco Use per Day: 0  Pregnancy Tobacco Use per Day: 0  Pre-Pregnacy  Alcohol Use per Day: 0  Pregnancy Alcohol Use per Day: 0  Pre-Pregnancy Illicit/Recreational Drug Use per Day: 0  Pregnancy Illicit/Recreational Drug Use per Day: 0  D(Rh) Sensitized: No  Latex Allergy: No  Betadine Allergy: No  Anesthetic Complications No  History of Abnormal Pap: No  Uterine Anomaly: No  History of Infertility: No  DES Exposure: No  ART Treatment: No      Infection History:   Exposed to TB: no   History of Genital Herpes: no   Sexual partner w/hx of oral or genital HSV: no  Rash or Viral Illness since LMP: no  Hepatitis B: no  Vaccinated for Hepatits B: no  Hepatitis C: no  History of Varicella Vaccine: no  History of Varicella Infection: no  History of STD: no  History of PID: no  Do you care for any cats: no    Genetic Testing/Teratology Counseling:   Patient's Age>= 35 Years as of Estimated Date of Delivery: no  Thalassemia Risk: no  Neural Tube Defect: no  Congenital Heart Defect: no   Down Syndrome: no  Tay-Sachs: no  Canavan Disease: no  Familial Dysautonomia: no  Sickle Cell Disease/Trait: no  Hemophilia/Blood Disorders: no  Muscular Dystrophy: no  Cystic Fibrosis: no  Huntington's Chorea: no  Mental Retardation/Autism no  Tested for Fragile X: no  Other Genetic or Chromosomal: no  Maternal Metabolic Disorder: no  Other birth defects: no  Recurrent Pregnancy Loss/Stillbirth: no  Medications/Drugs/Alcohol since LMP: no  Previous Genetic testing for Cystic Fibrosis: no  Previous Genetic testing father of baby: no    Past Pregnancies:  G:  4 P:  3 A:  0  SAB: 0  EAB:  0  Term Deliveries:  3  Premature Deliveries:   Ectopic pregnancies:    H/O Multi-fetal Delivery:      Preg # Date Early Loss/ Termination GA Wks Lgth of Labor Brth Wt Sex Type of Del Anes Pl of Del PTL Fetal Comp Matrnl Comp Cmnts   1 03/04/02  FT 16HRS 7-4 F c-section epidural [employer] yes/brethine none No progress    2 12/15/03  40  8-6 M c-section spinal [employer] no none none    3 76/6/09  37  6-1 F c-section spinal [place] yes none none    4 current                     EDD:   LMP: 09/22/2009    EDD by LMP: 06/29/2010  1st [place] date: 11/25/2009 = 9  weeks, 1  days  EDD: 06/29/2010 by LMP    Today's EGA in weeks: 12 6/7    Dating Comments:  EDD 06/29/10 per 9 wk sono = lmp  ..................................................................Marland KitchenMaris Berger CNM  November 26, 2009 8:25 AM      History of Present Illness:   feeling flutters. only took 2-3 days of macrobid.     Symptoms:   Complains of: constipation  Denies: back pain, change in discharge, fatigue, headache, heartburn, nausea, vomiting, pelvic pressure, preterm labor symptoms, swelling, uterine activity, vaginal bleeding, loss of fluid, concerns for personal safety  Fetal movement: active baby    Prenatal Exam:     Weight    Pre-Pregnancy Weight: 220 lbs.  Total weight change: -5 lbs.  Weight change since last visit: -0.20 lbs.    Exam   Est. Gestational Age: 27 6/7  Fundal height (cm): below umbilicus  FHR: 150's  Edema: none    List of Diagnoses/Issues:   - h/o C/S X 3  - Desires BTL  - BMI 41  - UTI  - undertreated  - early GCT 137    Plan of Care:   - repeat GCT at 18wga  - repeat Ucx today  - sign tubal consents at later gestation          LABS REVIEWED    Initial Labs:   Blood Type: O (11/25/2009 1:40:00 PM)  D (Rh) Type: Positive (11/25/2009 1:40:00 PM)  Antibody Screen: Negative (11/25/2009 1:42:00 PM)  Hemoglobin: 13.2 (11/25/2009 12:28:00 PM)  Hematocrit: 38.6 (11/25/2009 12:28:00 PM)  MCV: 82.2 (11/25/2009 12:28:00 PM)  Pap Smear: NIL  Rubella: IMMUNE (11/25/2009 5:54:00 PM)  RPR: Non Reactive (11/26/2009 4:11:00 AM)  Urine Culture: Positive (E.Coli)  HBsAg: NON-REACTIVE (11/25/2009 5:54:00 PM)  HIV: Negative (11/25/2009 1:31:00 PM)  Hgb Electrophoresis Phenotype: Normal  Hemoglobin F:  0.0 (11/26/2009 3:15:00 PM)  Hemoglobin A2:  2.2 (11/26/2009 3:15:00 PM)  Hemoglobin C:  0.0 (11/26/2009 3:15:00 PM)  Hemoglobin S:  0.0 (11/26/2009 3:15:00 PM)  Chlamydia:  negative  Gonorrhea: negative    [place] REVIEW  US# Date GA EstFetal Wt % Placenta Fluid Anatomy Recommendations Comments   1 11/25/2009 9 1/7       EDD    2 12/16/2009 12 1/7      NT results pending; comprehensive fetal survey 18/20 weeks FHR 150; IUP 12 1/7 weeks with EDD 06/29/10 with NT measurement 1.3   3            4            5            6                     Assessment   24 Years Old Female      G: 4  P 3:     Term: 3   Preterm: 0   Ab: 0   # Living Children: 3  SAB: 0  EAB: 0  ECT: 0  GA: 12 6/7 weeks       EDD: 06/29/2010    Status of Existing Problems  Assessed UTI as comment only - not adequately treated; will obtain repeat Ucx - Harun Brumley Donnetta Hutching MD - Signed  Assessed PREVIOUS C-SECT DELIVERY X3 as comment only - discussed recommendation for repeat c/s  discussed SSI study - [place] Sherr D Miyoshi MD - Signed  Assessed PREGNANCY, MULTIGRAVIDA as comment only - encouraged increase by mouth fluid hydration  encouraged increase fiber intake and stool softeners as needed  Repeat 1h GCT at 18wga  RTC 4 weeks - Ledford Goodson Donnetta Hutching MD - Signed    Today's Orders   Urine Culture   (UC) (395) [[id number]]    Disposition:   Clinic: Resident COC  Return to clinic for Provider Visit in 4 week(s)   Appointment Reason: ROB    *Patient Care Summary printed and given to patient.      Nature conservation officer   I discussed with resident and agree with resident's findings and plan as documented in resident's note.    HPI: 23yo [id number] at 12 wks  No complaints   h/o C/S X 3  - Desires BTL  - BMI 41  - UTI  - undertreated  - early GCT 137  Physical Exam: Per Dr. Lorna Few exam    A&P Repeat CS and BTL at 39 wks  Obesity- early GCT BL, will recheck, ANFS at 32 wks  1st TM aneuploidy screen    Comments: Return to clinc 4 wks    Preceptor: [name] MD on Dec 21, 2009 1:39 PM      Patient Education   1st Trimester Education:  HIV/Prenatal tests, prenatal history risk factors, course of prenatal care, exercise/nutrition/wt gain, toxoplasmosis  (cat/raw meat), sexual activity, exercise, smoking counseling, environment/work hazards, travel/seatbelts, tobacco/alcohol/drugs, use of medications, indications for ultrasound, domestic violence, childbirth classes, hospital facilities                ]

## 2009-12-21 NOTE — Unmapped (Signed)
Signed by Roxanne Mins MD on 12/21/2009 at 13:11:23  Patient: Diane Stafford  Note: All result statuses are Final unless otherwise noted.    Tests: (1) POC URINALYSIS (POCUA)    Glucose, Urine            Negative mg/dL              Negative    Bilirubin, Urine          Negative                    Negative    Ketone                    Negative mg/dL              Negative    Specific Gravity          1.020                       1.005-1.035    Blood                     Negative                    Negative    pH                        7.0                         5.0-8.0    Protein, urine            Negative mg/dL              Negative    Urobilinogen              0.2 mg/dL                   1.6-1.0    Nitrite                   Negative                    Negative    Leukocyte Esterase        Negative                    Negative    Note: An exclamation mark (!) indicates a result that was not dispersed into   the flowsheet.  Document Creation Date: 12/21/2009 1:06 PM  _______________________________________________________________________    (1) Order result status: Final  Collection or observation date-time: 12/21/2009 13:02  Requested date-time: 12/21/2009 13:02  Receipt date-time: 12/21/2009 12:23  Reported date-time: 12/21/2009 13:06  Referring Physician: Keyton Bhat MIYOSHI  Ordering Physician: Ravynn Hogate MIYOSHI [facility])  Specimen Source: U&URINE     UACUP&URINALYSIS CONTAINER  Source: [name] Order Number: [id number]  Lab site: [facility]      [address]      [place] [address]  [phone]      -----------------    The following lab values were dispersed to the flowsheet  with no units conversion:      Urobilinogen, 0.2 MG/DL, (F)  expected units: E units/dL    -----------------    The following non-numeric lab results were dispersed to  the flowsheet even though numeric results were expected:      Glucose,  Urine, Negative

## 2009-12-23 NOTE — Unmapped (Signed)
Signed by Maris Berger CNM on 12/25/2009 at 11:42:36  FIRST TRIMESTER SCREEN RPT FR: NTDLABS (DATED 12/16/09)      Imported By: Georgianne Fick 12/23/2009 15:27:01    _____________________________________________________________________    External Attachment:    Please see [facility] EMR for this document.

## 2009-12-23 NOTE — Unmapped (Signed)
Signed by Ranae Palms NP on 12/23/2009 at 10:34:57    PHONE NOTE    PHONE NOTE  Call placed to: Patient  Reason for Call: discuss lab or test results  Summary of call: Attempted to call NT results to pt.Phone disconnected.  Will scan into EMR for primary provider to discuss results at next appointment.    Call placed by: Ranae Palms NP,  Dec 23, 2009 10:34 AM

## 2010-01-06 ENCOUNTER — Inpatient Hospital Stay

## 2010-01-06 NOTE — Unmapped (Addendum)
Signed by Daneen Schick CNM on 01/06/2010 at 12:32:51    PRENATAL VISIT        Allergies  No Known Allergies    Vital Signs   Height: 61 in.   Pre-Pregnancy Weight: 220 lbs.                   Menses History:   [facility]ospital [facility] Delivery: [facility]  First day LMP: 09/22/2009  This date is: approximate  Menses monthly: yes  Frequency:  32 days  Menarche: 10  Contraception used at conception: None  Planned pregnancy: no  Pregnancy confirmed by: home pregnancy test  G: 4  P 3:     Term: 3   Preterm: 0   Ab: 0   # Living Children: 3  SAB: 0  EAB: 0  ECT: 0    Other Pertinent History:   Pre-Pregnacy Tobacco Use per Day: 0  Pregnancy Tobacco Use per Day: 0  Pre-Pregnacy Alcohol Use per Day: 0  Pregnancy Alcohol Use per Day: 0  Pre-Pregnancy Illicit/Recreational Drug Use per Day: 0  Pregnancy Illicit/Recreational Drug Use per Day: 0  D(Rh) Sensitized: No  Latex Allergy: No  Betadine Allergy: No  Anesthetic Complications No  History of Abnormal Pap: No  Uterine Anomaly: No  History of Infertility: No  DES Exposure: No  ART Treatment: No      Infection History:   Exposed to TB: no   History of Genital Herpes: no   Sexual partner w/hx of oral or genital HSV: no  Rash or Viral Illness since LMP: no  Hepatitis B: no  Vaccinated for Hepatits B: no  Hepatitis C: no  History of Varicella Vaccine: no  History of Varicella Infection: no  History of STD: no  History of PID: no  Do you care for any cats: no    Genetic Testing/Teratology Counseling:   Patient's Age>= 35 Years as of Estimated Date of Delivery: no  Thalassemia Risk: no  Neural Tube Defect: no  Congenital Heart Defect: no   Down Syndrome: no  Tay-Sachs: no  Canavan Disease: no  Familial Dysautonomia: no  Sickle Cell Disease/Trait: no  Hemophilia/Blood Disorders: no  Muscular Dystrophy: no  Cystic Fibrosis: no  Huntington's Chorea: no  Mental Retardation/Autism no  Tested for Fragile X: no  Other Genetic or Chromosomal: no  Maternal Metabolic Disorder:  no  Other birth defects: no  Recurrent Pregnancy Loss/Stillbirth: no  Medications/Drugs/Alcohol since LMP: no  Previous Genetic testing for Cystic Fibrosis: no  Previous Genetic testing father of baby: no    EDD:   LMP: 09/22/2009    EDD by LMP: 06/29/2010  1st [place] date: 11/25/2009 = 9  weeks, 1  days  EDD: 06/29/2010 by LMP    Dating Comments:  EDD 06/29/10 per 9 wk sono = lmp  ..................................................................Marland KitchenMaris Berger CNM  November 26, 2009 8:25 AM      Prenatal Exam:     Weight    Pre-Pregnancy Weight: 220 lbs.    List of Diagnoses/Issues:   - h/o C/S X 3  - Desires BTL  - BMI 41  - UTI  - undertreated  - early GCT 137    Plan of Care:   - repeat GCT at 18wga  - repeat Ucx today  - sign tubal consents at later gestation        Initial Labs:   Blood Type: O (11/25/2009 1:40:00 PM)  D (Rh) Type: Positive (11/25/2009 1:40:00 PM)  Antibody Screen: Negative (11/25/2009 1:42:00 PM)  Hemoglobin: 13.2 (11/25/2009 12:28:00 PM)  Hematocrit: 38.6 (11/25/2009 12:28:00 PM)  MCV: 82.2 (11/25/2009 12:28:00 PM)  Pap Smear: NIL  Rubella: IMMUNE (11/25/2009 5:54:00 PM)  RPR: Non Reactive (11/26/2009 4:11:00 AM)  Urine Culture: Positive (E.Coli)  HBsAg: NON-REACTIVE (11/25/2009 5:54:00 PM)  HIV: Negative (11/25/2009 1:31:00 PM)  Hgb Electrophoresis Phenotype: Normal  Hemoglobin F:  0.0 (11/26/2009 3:15:00 PM)  Hemoglobin A2:  2.2 (11/26/2009 3:15:00 PM)  Hemoglobin C:  0.0 (11/26/2009 3:15:00 PM)  Hemoglobin S:  0.0 (11/26/2009 3:15:00 PM)  Chlamydia: negative  Gonorrhea: negative        Assessment   24 Years Old Female      G: 4  P 3:     Term: 3   Preterm: 0   Ab: 0   # Living Children: 3  SAB: 0  EAB: 0  ECT: 0       EDD: 06/29/2010    Disposition:   Clinic: Resident COC      Patient Education   1st Trimester Education:  HIV/Prenatal tests, prenatal history risk factors, course of prenatal care, exercise/nutrition/wt gain, toxoplasmosis (cat/raw meat), sexual activity, exercise, smoking  counseling, environment/work hazards, travel/seatbelts, tobacco/alcohol/drugs, use of medications, indications for ultrasound, domestic violence, childbirth classes, hospital facilities                ]  Signed by Janetta Hora RN on 01/06/2010 at 13:54:40    No show for [facility], Session 1.  Unable to reach pt.  Will schedule for ROB and alert to future centering sessions if pt calls [facility].  ..................................................................Marland KitchenJanetta Hora, RN  01/06/10 at 1354.

## 2010-01-18 LAB — OFFICE VISIT LAB RESULTS
Antibody Screen: NEGATIVE
Hematocrit: 38.6
Hemoglobin: 13.2
Hep B Surface Ag: NONREACTIVE
MCV: 82.2
Rubella Antibodies: IMMUNE
hCG Quant: 0.64 {M.o.M}

## 2010-01-18 LAB — POC URINALYSIS
Bilirubin Urine: NEGATIVE
Glucose, UA: NEGATIVE g/dL
Ketones, UA: NEGATIVE
Leukocytes, UA: NEGATIVE
Nitrite, UA: NEGATIVE
RBC, UA: NEGATIVE
Specific Gravity, UA: 1.025 (ref 1.005–1.035)
Urobilinogen, UA: 0.2 E units/dL (ref 0.2–1.0)
pH, UA: 6 (ref 5.0–8.0)

## 2010-01-18 NOTE — Unmapped (Signed)
Signed by Roxanne Mins MD on 01/18/2010 at 17:13:29    PRENATAL VISIT      Reason for Visit   Chief Complaint: Return OB Visit  History from: patient    Allergies  No Known Allergies    Medications  PRENATAL VITAMINS  TABS (PRENATAL MV & MIN W/FE-FA TABS)         Vital Signs   Height: 61 in.   Weight: 212.7 lbs.   Pre-Pregnancy Weight: 220 lbs.  Respirations: 18   Pulse rate: 84     Blood Pressure   BP #1: 112 / 64mm Hg  Cuff Size: Std   Intake recorded by: Sharin Grave on January 18, 2010 4:05 PM                   Menses History:   Hospital of Delivery: [facility]  First day LMP: 09/22/2009  This date is: approximate  Menses monthly: yes  Frequency:  32 days  Menarche: 10  Contraception used at conception: None  Planned pregnancy: no  Pregnancy confirmed by: home pregnancy test  G: 4  P 3:     Term: 3   Preterm: 0   Ab: 0   # Living Children: 3  SAB: 0  EAB: 0  ECT: 0  Past History  Surgical History (reviewed - no changes required):  2-Wisdom teeth removed 2010, Cesarean Section: x3    Family History (reviewed - no changes required): MGM - Heart Dz  passed HTN,aorta valve problem,Altermeizers  MGF - Aneurysim Brain,HTN passed away  Aunt - 1-pSYCH ISSUES  Uncle - 2- have had Open Heart Surgery 2- HTN  Social History (reviewed - no changes required): Preferred Language: English,   Marital Status: single,   Spouse: FOB Haematologist  Children: 3,   Employment Status: homemaker,   Patient Lives at: apartment,   Support System: excellent  Exercise: walking,   Caffeine per Day: <1  Seatbelt Use: 100 % of the time  Alcohol Use: none  Drug Use: none  Tobacco Usage:prior smoker  Sexually Active: Yes      Other Pertinent History:   Pre-Pregnacy Tobacco Use per Day: 0  Pregnancy Tobacco Use per Day: 0  Pre-Pregnacy Alcohol Use per Day: 0  Pregnancy Alcohol Use per Day: 0  Pre-Pregnancy Illicit/Recreational Drug Use per Day: 0  Pregnancy Illicit/Recreational Drug Use per Day: 0  D(Rh) Sensitized: No  Latex Allergy:  No  Betadine Allergy: No  Anesthetic Complications No  History of Abnormal Pap: No  Uterine Anomaly: No  History of Infertility: No  DES Exposure: No  ART Treatment: No      Infection History:   Exposed to TB: no   History of Genital Herpes: no   Sexual partner w/hx of oral or genital HSV: no  Rash or Viral Illness since LMP: no  Hepatitis B: no  Vaccinated for Hepatits B: no  Hepatitis C: no  History of Varicella Vaccine: no  History of Varicella Infection: no  History of STD: no  History of PID: no  Do you care for any cats: no    Genetic Testing/Teratology Counseling:   Patient's Age>= 35 Years as of Estimated Date of Delivery: no  Thalassemia Risk: no  Neural Tube Defect: no  Congenital Heart Defect: no   Down Syndrome: no  Tay-Sachs: no  Canavan Disease: no  Familial Dysautonomia: no  Sickle Cell Disease/Trait: no  Hemophilia/Blood Disorders: no  Muscular Dystrophy: no  Cystic Fibrosis:  no  Huntington's Chorea: no  Mental Retardation/Autism no  Tested for Fragile X: no  Other Genetic or Chromosomal: no  Maternal Metabolic Disorder: no  Other birth defects: no  Recurrent Pregnancy Loss/Stillbirth: no  Medications/Drugs/Alcohol since LMP: no  Previous Genetic testing for Cystic Fibrosis: no  Previous Genetic testing father of baby: no    Past Pregnancies:  G:  4 P:  3 A:  0  SAB: 0  EAB:  0  Term Deliveries:  3  Premature Deliveries:   Ectopic pregnancies:    H/O Multi-fetal Delivery:      Preg # Date Early Loss/ Termination GA Wks Lgth of Labor Brth Wt Sex Type of Del Anes Pl of Del PTL Fetal Comp Matrnl Comp Cmnts   1 03/04/02  FT 16HRS 7-4 F c-section epidural [facility] yes/brethine none No progress    2 12/15/03  40  8-6 M c-section spinal [employer] no none none    3 76/6/09  37  6-1 F c-section spinal [address] yes none none    4 current                     EDD:   LMP: 09/22/2009    EDD by LMP: 06/29/2010  1st [place] date: 11/25/2009 = 9  weeks, 1  days  EDD: 06/29/2010 by LMP    Today's EGA in weeks: 16  6/7    Dating Comments:  EDD 06/29/10 per 9 wk sono = lmp  ..................................................................Marland KitchenMaris Berger CNM  November 26, 2009 8:25 AM      History of Present Illness:   pt doing great and adding swimming to her exercise regimen    Symptoms:   Denies: back pain, change in discharge, constipation, fatigue, headache, heartburn, nausea, vomiting, pelvic pressure, preterm labor symptoms, swelling, uterine activity, vaginal bleeding, loss of fluid, concerns for personal safety  Fetal movement: active baby    Prenatal Exam:     Weight    Pre-Pregnancy Weight: 220 lbs.  Total weight change: -7.30 lbs.  Weight change since last visit: -2.30 lbs.    Exam   Est. Gestational Age: 72 6/7  Fundal height (cm): below umbilicus  FHR: 150's  Edema: none    List of Diagnoses/Issues:   - h/o C/S X 3  - Desires BTL  - BMI 41  - UTI  - undertreated  - early GCT 137    Plan of Care:   - repeat GCT at 18wga (script given to be done in [place] to be faxed)  - sign tubal consents at later gestation  - continue exercise in pregnancy          LABS REVIEWED    Initial Labs:   Blood Type: O (11/25/2009 1:40:00 PM)  D (Rh) Type: Positive (11/25/2009 1:40:00 PM)  Antibody Screen: Negative (11/25/2009 1:42:00 PM)  Hemoglobin: 13.2 (11/25/2009 12:28:00 PM)  Hematocrit: 38.6 (11/25/2009 12:28:00 PM)  MCV: 82.2 (11/25/2009 12:28:00 PM)  Pap Smear: NIL  Rubella: IMMUNE (11/25/2009 5:54:00 PM)  RPR: Non Reactive (11/26/2009 4:11:00 AM)  Urine Culture: Positive (E.Coli)  HBsAg: NON-REACTIVE (11/25/2009 5:54:00 PM)  HIV: Negative (11/25/2009 1:31:00 PM)  Hgb Electrophoresis Phenotype: Normal  Hemoglobin F:  0.0 (11/26/2009 3:15:00 PM)  Hemoglobin A2:  2.2 (11/26/2009 3:15:00 PM)  Hemoglobin C:  0.0 (11/26/2009 3:15:00 PM)  Hemoglobin S:  0.0 (11/26/2009 3:15:00 PM)  Chlamydia: negative  Gonorrhea: negative  Initial Lab Comments: 11/25/09: 1h GCT 137  8-20 Week Labs:   1st Trimester Aneuploidy Risk Assessment  Down's Risk 1:   >10000  Trisomy 13/18 1: >10000  PAPP-A MoM:  .51  Free beta hCG MoM:  .64    [place] REVIEW  US# Date GA EstFetal Wt % Placenta Fluid Anatomy Recommendations Comments   1 11/25/2009 9 1/7       EDD    2 12/16/2009 12 1/7      NT results pending; comprehensive fetal survey 18/20 weeks FHR 150; IUP 12 1/7 weeks with EDD 06/29/10 with NT measurement 1.3   3            4            5            6                     Assessment   24 Years Old Female      G: 4  P 3:     Term: 3   Preterm: 0   Ab: 0   # Living Children: 3  SAB: 0  EAB: 0  ECT: 0  GA: 16 6/7 weeks       EDD: 06/29/2010    Status of Existing Problems  Assessed PREVIOUS C-SECT DELIVERY X3 as comment only - plan for repeat - Corena Tilson Donnetta Hutching MD - Signed  Assessed UTI as comment only - repeat Urine culture next visit - Shanti Eichel Donnetta Hutching MD - Signed  Assessed PREGNANCY, MULTIGRAVIDA as comment only - doing well  precautions at this gestational age  repeat 1h GCT next week; discussed plan for 3hGTT if abnormal  anatomy scan ordered  RTC 4-5 weeks - Jaida Basurto Donnetta Hutching MD - Signed    Today's Orders   OB US, Comprehensive Anatomy (First US done at greater than 18 weeks) [[id number]]    Disposition:   Clinic: Resident COC  Return to clinic for Provider Visit in 4-5 week(s)   Appointment Reason: ROB    *Patient Care Summary printed and given to patient.      Nature conservation officer   I discussed with resident and agree with resident's findings and plan as documented in resident's note.    HPI: 23yo [id number] at 16 wks   No complaints  Physical Exam: Per Dr. Jeanne Ivan exam  A&P C/S x 3-repeat at 39 wks and BTL  Obesity-early GCT borderline, now will repeat  antatomy scan next appt  Comments: Return to clinc 4 wks    Preceptor: [name] MD on January 18, 2010 4:33 PM      Patient Education   1st Trimester Education:  HIV/Prenatal tests, prenatal history risk factors, course of prenatal care, exercise/nutrition/wt gain, toxoplasmosis (cat/raw meat), sexual activity, exercise, smoking  counseling, environment/work hazards, travel/seatbelts, tobacco/alcohol/drugs, use of medications, indications for ultrasound, domestic violence, childbirth classes, hospital facilities  =      RN exit-  ROB / See note from Dr. Amado Coe  Unable to schedule ultrasound d/t lateness of day  Order faxed to [phone] -  Pt. will call tomorrow for appt.   RTC 4-5 wks....................................................................Willeen Niece RN  January 18, 2010 4:35 PM                ]

## 2010-01-18 NOTE — Unmapped (Signed)
Signed by Roxanne Mins MD on 01/18/2010 at 16:04:47  Patient: Diane Stafford  Note: All result statuses are Final unless otherwise noted.    Tests: (1) POC URINALYSIS (POCUA)    Glucose, Urine            Negative mg/dL              Negative    Bilirubin, Urine          Negative                    Negative    Ketone                    Negative mg/dL              Negative    Specific Gravity          1.025                       1.005-1.035    Blood                     Negative                    Negative    pH                        6.0                         5.0-8.0    Protein, urine       [A]  Trace mg/dL                 Negative    Urobilinogen              0.2 mg/dL                   5.4-0.9    Nitrite                   Negative                    Negative    Leukocyte Esterase        Negative                    Negative    Note: An exclamation mark (!) indicates a result that was not dispersed into   the flowsheet.  Document Creation Date: 01/18/2010 4:03 PM  _______________________________________________________________________    (1) Order result status: Final  Collection or observation date-time: 01/18/2010 16:00  Requested date-time: 01/18/2010 16:00  Receipt date-time: 01/18/2010 15:21  Reported date-time: 01/18/2010 16:03  Referring Physician: Iokepa Geffre MIYOSHI  Ordering Physician: Tierre Gerard MIYOSHI [facility])  Specimen Source: U&URINE     UACUP&URINALYSIS CONTAINER  Source: [name] Order Number: [id number]  Lab site: [facility]      [address]      [place] [address]  [phone]      -----------------    The following lab values were dispersed to the flowsheet  with no units conversion:      Urobilinogen, 0.2 MG/DL, (F)  expected units: E units/dL    -----------------    The following non-numeric lab results were dispersed to  the flowsheet even though numeric results were expected:      Glucose,  Urine, Negative

## 2010-01-21 NOTE — Unmapped (Signed)
Signed by   [employer]Log[employer] on 01/22/2010 at 10:28:07  Patient: Diane Stafford  Note: All result statuses are Final unless otherwise noted.    Tests: (1)  (MR)    Order Note:                                         [facility]     PATIENT NAME:   Diane Stafford, Diane Stafford                  MR #:  [id number]  DATE OF BIRTH:  1985-08-29                        ACCOUNT #:  [id number]  ED PHYSICIAN:   Arnoldo Morale, M.D.               ROOM #:  PRIMARY:        [name]. [name], M.D.              NURSING UNIT:  ED  REFERRING:      Selected Referral Pt              FC:  D  DICTATED BY:    Victorino Dike L. Elson Areas, M.D.         ADMIT DATE:  01/21/2010  VISIT DATE:     01/21/2010                        DISCHARGE DATE:                               EMERGENCY DEPARTMENT NOTE     *-*-*     CHIEF COMPLAINT:  Abdominal pain.     HISTORY OF PRESENT ILLNESS:  The patient is a 24 year old white female, who  is [redacted] weeks pregnant presenting to the emergency room with abdominal pain.  She says this pain started last night around 9:30 to 10:00 p.m.  Describes it  as epigastric, radiating around the rib cage circular on bilateral sides,  describes it as an intense pressure.  It is constant and it has no relieving  qualities except for that if she changes position, she gets minimal relief  for about 30 seconds and then the pain returns.  The patient also has nausea  and vomiting x1 associated with the pain.  She denies diarrhea or  constipation.  The patient originally thought it was acid reflux and took  some Tums and tried to get some rest.  However, she was unable to sleep  because of the pain.  The patient is currently [redacted] weeks pregnant.  She has  had three prior C-sections with no complications.  She denies any vaginal  bleeding.  The pain does not feel like contractions and denies any dysuria,  hematuria.  Off note, the patient also attended a cookout last night where  she ate a couple hamburgers and some [place] fries.     PAST MEDICAL HISTORY:     1. Three prior pregnancies where she had C-sections.     MEDICATIONS:     1. Prenatal vitamins.     FAMILY HISTORY:  Noncontributory.     SOCIAL HISTORY:  Noncontributory.     PHYSICAL EXAMINATION:     VITAL SIGNS:  Blood pressures 112/74,  pulse 67, respirations 18, temperature  is 97 and sat is 100% on room air.  GENERAL:  The patient appears in no acute distress.  HEENT:  Pupils are equal, round, reactive to light and accommodation.  NECK:  Supple.  LUNGS:  Clear to auscultation.  CARDIOVASCULAR:  Shows regular rate and rhythm, no murmurs.  GASTROINTESTINAL:  The patient has a gravid uterus, as stated 17 weeks  pregnancy.  No tenderness to palpation in the epigastric region, right upper  quadrant or left upper quadrant.  Positive bowel sounds.  EXTREMITIES:  Warm, well-perfused with +2 pulses.     LABORATORY DATA:  The patient had a normal renal panel, normal LFTs except  for a protein of 6.1 and albumin of 3.1.  UA was negative for urinary tract  infection.     IMAGING:  Abdominal ultrasound showed gallstones, but no gallbladder wall  thickening, no common bile duct dilation, no signs of infections or  inflammation.     EMERGENCY DEPARTMENT COURSE:  The patient was brought back to room B23 and  examined by myself, the attending physician, who is in agreement with this  plan.  The patient was initially given a GI cocktail, which did somewhat  relieve her symptoms.  She was able to get some rest; however, the pain  returned.  She was then given two tabs of Vicodin and morphine, which  relieved her pain.  The patient was feeling a little nauseous as well and was  given one dose of intravenous Zofran 4 mg, which improved her symptoms.  After, obtaining the abdominal ultrasound showing positive for gallstones,  but no active signs of infection, we will be discharging the patient home.  She was advised on eating a low fat diet and to follow up with her OB/GYN in  the next seven to ten days.  She was also advised to  return if she has any  fevers, chills or is feeling increased pain.     ASSESSMENT:     1. Gallstones.     PLAN:     1. The patient did have episode of gallstones, however, no active signs of    cholecystitis.  2. She is okay to be discharged home with instructions to eat a low fat diet.  3. She can use Vicodin as needed for pain.  4. She is to follow up with her OB/GYN.  5. The patient advised to return to the emergency room immediately if she    develops fevers, chills, increasing abdominal pain or feeling ill.        *-*-*                                              _______________________________________  JLN/jm                                 _____  D:  01/21/2010 12:33                  Jennifer L. Elson Areas, M.D.  T:  01/22/2010 04:32  Job #:  [id number]  _______________________________________                                         _____                                        Arnoldo Morale, M.D.                                   EMERGENCY DEPARTMENT NOTE                                                               PAGE    1 of   1    Note: An exclamation mark (!) indicates a result that was not dispersed into   the flowsheet.  Document Creation Date: 01/22/2010 10:28 AM  _______________________________________________________________________    (1) Order result status: Final  Collection or observation date-time: 01/21/2010 00:00  Requested date-time:   Receipt date-time:   Reported date-time:   Referring Physician: Selected Pt  Ordering Physician:  Reviewed In Hospital [facility])  Specimen Source:   Source: DBS  Filler Order Number: [id number]  Lab site:

## 2010-01-22 NOTE — Unmapped (Signed)
[facility]     PATIENT NAME:   Diane Stafford, Diane Stafford                  MR #:  [id number]   DATE OF BIRTH:  1985/09/13                        ACCOUNT #:  [id number]   ED PHYSICIAN:   Arnoldo Morale, M.D.               ROOM #:   PRIMARY:        [name]. [name], M.D.              NURSING UNIT:  ED   REFERRING:      Selected Referral Pt              FC:  D   DICTATED BY:    Victorino Dike L. Elson Areas, M.D.         ADMIT DATE:  01/21/2010   VISIT DATE:     01/21/2010                        DISCHARGE DATE:                               EMERGENCY DEPARTMENT NOTE     *-*-*     CHIEF COMPLAINT:  Abdominal pain.     HISTORY OF PRESENT ILLNESS:  The patient is a 24 year old white female, who   is [redacted] weeks pregnant presenting to the emergency room with abdominal pain.   She says this pain started last night around 9:30 to 10:00 p.m.  Describes it   as epigastric, radiating around the rib cage circular on bilateral sides,   describes it as an intense pressure.  It is constant and it has no relieving   qualities except for that if she changes position, she gets minimal relief   for about 30 seconds and then the pain returns.  The patient also has nausea   and vomiting x1 associated with the pain.  She denies diarrhea or   constipation.  The patient originally thought it was acid reflux and took   some Tums and tried to get some rest.  However, she was unable to sleep   because of the pain.  The patient is currently [redacted] weeks pregnant.  She has   had three prior C-sections with no complications.  She denies any vaginal   bleeding.  The pain does not feel like contractions and denies any dysuria,   hematuria.  Off note, the patient also attended a cookout last night where   she ate a couple hamburgers and some [place] fries.     PAST MEDICAL HISTORY:     1. Three prior pregnancies where she had C-sections.     MEDICATIONS:     1. Prenatal vitamins.     FAMILY HISTORY:  Noncontributory.     SOCIAL HISTORY:   Noncontributory.     PHYSICAL EXAMINATION:     VITAL SIGNS:  Blood pressures 112/74, pulse 67, respirations 18, temperature   is 97 and sat is 100% on room air.   GENERAL:  The patient appears in no acute distress.   HEENT:  Pupils are equal, round, reactive to light and accommodation.   NECK:  Supple.   LUNGS:  Clear to auscultation.  CARDIOVASCULAR:  Shows regular rate and rhythm, no murmurs.   GASTROINTESTINAL:  The patient has a gravid uterus, as stated 17 weeks   pregnancy.  No tenderness to palpation in the epigastric region, right upper   quadrant or left upper quadrant.  Positive bowel sounds.   EXTREMITIES:  Warm, well-perfused with +2 pulses.     LABORATORY DATA:  The patient had a normal renal panel, normal LFTs except   for a protein of 6.1 and albumin of 3.1.  UA was negative for urinary tract   infection.     IMAGING:  Abdominal ultrasound showed gallstones, but no gallbladder wall   thickening, no common bile duct dilation, no signs of infections or   inflammation.     EMERGENCY DEPARTMENT COURSE:  The patient was brought back to room B23 and   examined by myself, the attending physician, who is in agreement with this   plan.  The patient was initially given a GI cocktail, which did somewhat   relieve her symptoms.  She was able to get some rest; however, the pain   returned.  She was then given two tabs of Vicodin and morphine, which   relieved her pain.  The patient was feeling a little nauseous as well and was   given one dose of intravenous Zofran 4 mg, which improved her symptoms.   After, obtaining the abdominal ultrasound showing positive for gallstones,   but no active signs of infection, we will be discharging the patient home.   She was advised on eating a low fat diet and to follow up with her OB/GYN in   the next seven to ten days.  She was also advised to return if she has any   fevers, chills or is feeling increased pain.     ASSESSMENT:     1. Gallstones.     PLAN:     1. The patient  did have episode of gallstones, however, no active signs of     cholecystitis.   2. She is okay to be discharged home with instructions to eat a low fat diet.   3. She can use Vicodin as needed for pain.   4. She is to follow up with her OB/GYN.   5. The patient advised to return to the emergency room immediately if she     develops fevers, chills, increasing abdominal pain or feeling ill.       *-*-*                                             _______________________________________   JLN/jm                                 _____   D:  01/21/2010 12:33                  Laurabelle Gorczyca L. Elson Areas, M.D.   T:  01/22/2010 04:32   Job #:  [id number]                                         _______________________________________  _____                                         Arnoldo Morale, M.D.                                  EMERGENCY DEPARTMENT NOTE                                                                PAGE    1 of   1                                  EMERGENCY DEPARTMENT NOTE                                                                PAGE    1 of   1

## 2010-02-12 ENCOUNTER — Inpatient Hospital Stay

## 2010-02-12 LAB — GTT60 MIN: Glucose, GTT 1 HR: 128 mg/dL

## 2010-02-12 NOTE — Unmapped (Signed)
Signed by Lauralyn Primes RN on 02/12/2010 at 10:12:07    Patient here for anatomy scan and repeat G.C.T. ..................................................................Marland KitchenLauralyn Primes RN  February 12, 2010 10:11 AM

## 2010-02-12 NOTE — Unmapped (Signed)
Signed by Roxanne Mins MD on 02/15/2010 at 14:58:51  OB US      Imported By: Georgianne Fick 02/15/2010 10:29:03    _____________________________________________________________________    External Attachment:    Please see [employer] EMR for this document.

## 2010-02-12 NOTE — Unmapped (Signed)
Signed by Roxanne Mins MD on 02/13/2010 at 23:27:46  Patient: Diane Stafford  Note: All result statuses are Final unless otherwise noted.    Tests: (1) GLUCOSE CHALLENGE, 1 HOUR (GCH1HR)    Glucose, 1 Hr             128 mg/dL    Note: An exclamation mark (!) indicates a result that was not dispersed into   the flowsheet.  Document Creation Date: 02/12/2010 12:15 PM  _______________________________________________________________________    (1) Order result status: Final  Collection or observation date-time: 02/12/2010 11:15  Requested date-time: 02/12/2010 11:15  Receipt date-time: 02/12/2010 11:46  Reported date-time: 02/12/2010 12:13  Referring Physician: Cindi Carbon NONSTAFF  Ordering Physician: Aadhya Bustamante MIYOSHI (MIYOSHAD)  Specimen Source: P&PLASMA     GRN45&LT GRN PST LITH HEP (4.5 mL)  Source: [name] Order Number: [id number]  Lab site: [facility]      [address]      [place] [address]  [phone]

## 2010-02-15 NOTE — Unmapped (Signed)
Signed by Roxanne Mins MD on 02/15/2010 at 15:00:15      24-28 Week Labs   Date:   02/12/2010  1Hr Glucose Challenge Test: 128    Recorded by: Roxanne Mins MD on February 15, 2010 3:00 PM          Clinical Issues:   Complete Problem List:   UTI (ICD-599.0)  PREVIOUS C-SECT DELIVERY X3 (XBM-841.32)  PREGNANCY, MULTIGRAVIDA (ICD-V22.1)      Current Medications:   PRENATAL VITAMINS  TABS (PRENATAL MV & MIN W/FE-FA TABS)       List of Diagnoses/Issues:   - h/o C/S X 3  - Desires BTL  - BMI 41  - UTI  - undertreated  - early GCT 137    Plan of Care:   - repeat GCT at 18wga (script given to be done in [place] to be faxed)  - sign tubal consents at later gestation  - continue exercise in pregnancy        OB Ultrasound   1st Ultrasound Date:  11/25/2009  GA:  9 1/7  [place] Comments:  EDD     2nd Ultrasound Date:  12/16/2009  GA:  12 1/7  Recommendations:  NT results pending; comprehensive fetal survey 18/20 weeks  [place] Comments:  FHR 150; IUP 12 1/7 weeks with EDD 06/29/10 with NT measurement 1.3    3rd Ultrasound Date:  02/12/2010  GA:  20 3/7  Est. Fetal Wt:  406  Placenta:  anterior/left  Fluid:  normal  Anatomy:  breech  Recommendations:  as indicated      EDD:   LMP: 09/22/2009    EDD by LMP: 06/29/2010  1st [place] date: 11/25/2009 = 9  weeks, 1  days  EDD: 06/29/2010 by LMP    Dating Comments:  EDD 06/29/10 per 9 wk sono = lmp  ..................................................................Marland KitchenMaris Berger CNM  November 26, 2009 8:25 AM

## 2010-02-16 LAB — POC URINALYSIS
Bilirubin Urine: NEGATIVE
Glucose, UA: NEGATIVE g/dL
Leukocyte Esterase, UA: NEGATIVE
Nitrite, UA: POSITIVE — AB
RBC, UA: NEGATIVE
Specific Gravity, UA: >=1.03 (ref 1.005–1.035)
Urobilinogen, UA: 0.2 U/dL (ref 0.2–1.0)
pH, UA: 6.5 (ref 5.0–8.0)

## 2010-02-16 NOTE — Unmapped (Signed)
Signed by Sheralyn Boatman MD on 02/19/2010 at 16:38:30    PRENATAL VISIT      Reason for Visit   Chief Complaint: Return OB Visit  History from: patient    Allergies  No Known Allergies    Medications  PRENATAL VITAMINS  TABS (PRENATAL MV & MIN W/FE-FA TABS)         Vital Signs   Height: 61 in.   Weight: 219.1 lbs.   Pre-Pregnancy Weight: 220 lbs.  Pulse rate: 22   Temperature: 96.8 degrees  F   Blood Pressure   BP #1: 102 / 70mm Hg  Cuff Size: Lg     Pain:     Have you had pain other than everyday aches and pains (e.g., mild headache, back ache, strains) in the past week?   No    Intake recorded by: Hilton Cork MA on February 16, 2010 4:32 PM                   Menses History:   [facility]  First day LMP: 09/22/2009  This date is: approximate  Menses monthly: yes  Frequency:  32 days  Menarche: 10  Contraception used at conception: None  Planned pregnancy: no  Pregnancy confirmed by: home pregnancy test  G: 4  P 3:     Term: 3   Preterm: 0   Ab: 0   # Living Children: 3  SAB: 0  EAB: 0  ECT: 0    Other Pertinent History:   Pre-Pregnacy Tobacco Use per Day: 0  Pregnancy Tobacco Use per Day: 0  Pre-Pregnacy Alcohol Use per Day: 0  Pregnancy Alcohol Use per Day: 0  Pre-Pregnancy Illicit/Recreational Drug Use per Day: 0  Pregnancy Illicit/Recreational Drug Use per Day: 0  D(Rh) Sensitized: No  Latex Allergy: No  Betadine Allergy: No  Anesthetic Complications No  History of Abnormal Pap: No  Uterine Anomaly: No  History of Infertility: No  DES Exposure: No  ART Treatment: No      Infection History:   Exposed to TB: no   History of Genital Herpes: no   Sexual partner w/hx of oral or genital HSV: no  Rash or Viral Illness since LMP: no  Hepatitis B: no  Vaccinated for Hepatits B: no  Hepatitis C: no  History of Varicella Vaccine: no  History of Varicella Infection: no  History of STD: no  History of PID: no  Do you care for any cats: no    Genetic Testing/Teratology Counseling:    Patient's Age>= 35 Years as of Estimated Date of Delivery: no  Thalassemia Risk: no  Neural Tube Defect: no  Congenital Heart Defect: no   Down Syndrome: no  Tay-Sachs: no  Canavan Disease: no  Familial Dysautonomia: no  Sickle Cell Disease/Trait: no  Hemophilia/Blood Disorders: no  Muscular Dystrophy: no  Cystic Fibrosis: no  Huntington's Chorea: no  Mental Retardation/Autism no  Tested for Fragile X: no  Other Genetic or Chromosomal: no  Maternal Metabolic Disorder: no  Other birth defects: no  Recurrent Pregnancy Loss/Stillbirth: no  Medications/Drugs/Alcohol since LMP: no  Previous Genetic testing for Cystic Fibrosis: no  Previous Genetic testing father of baby: no    Past Pregnancies:  G:  4 P:  3 A:  0  SAB: 0  EAB:  0  Term Deliveries:  3  Premature Deliveries:   Ectopic pregnancies:    H/O Multi-fetal Delivery:      Preg #  Date Early Loss/ Termination GA Wks Lgth of Labor Brth Wt Sex Type of Del Anes Pl of Del PTL Fetal Comp Matrnl Comp Cmnts   1 03/04/02  FT 16HRS 7-4 F c-section epidural [facility] yes/brethine none No progress    2 12/15/03  39  8-6 M c-section spinal [facility] no none none    3 76/6/09  36  6-1 F c-section spinal [address] yes none none    4 current                     EDD:   LMP: 09/22/2009    EDD by LMP: 06/29/2010  1st [place] date: 11/25/2009 = 9  weeks, 1  days  EDD: 06/29/2010 by LMP    Today's EGA in weeks: 21    Dating Comments:  EDD 06/29/10 per 9 wk sono = lmp  ..................................................................Marland KitchenMaris Berger CNM  November 26, 2009 8:25 AM      History of Present Illness:   25 [id number] at 21 wks for ROB.  No issues.    Symptoms:   Denies: back pain, change in discharge, constipation, fatigue, headache, heartburn, nausea, vomiting, pelvic pressure, preterm labor symptoms, swelling, uterine activity, vaginal bleeding, loss of fluid, concerns for personal safety  Fetal movement: active baby    Prenatal Exam:     Weight    Pre-Pregnancy Weight: 220  lbs.    Exam   Est. Gestational Age: 46  Fundal height (cm): 22  FHR: 140's    List of Diagnoses/Issues:   - h/o C/S X 3  - Desires BTL  - BMI 41  - early GCT 137    Plan of Care:   - sign tubal consents at later gestation  - continue exercise in pregnancy        Initial Labs:   Blood Type: O (11/25/2009 1:40:00 PM)  D (Rh) Type: Positive (11/25/2009 1:40:00 PM)  Antibody Screen: Negative (11/25/2009 1:42:00 PM)  Hemoglobin: 13.2 (11/25/2009 12:28:00 PM)  Hematocrit: 38.6 (11/25/2009 12:28:00 PM)  MCV: 82.2 (11/25/2009 12:28:00 PM)  Pap Smear: NIL  Rubella: IMMUNE (11/25/2009 5:54:00 PM)  RPR: Non Reactive (11/26/2009 4:11:00 AM)  Urine Culture: Positive (E.Coli)  HBsAg: NON-REACTIVE (11/25/2009 5:54:00 PM)  HIV: Negative (11/25/2009 1:31:00 PM)  Hgb Electrophoresis Phenotype: Normal  Hemoglobin F:  0.0 (11/26/2009 3:15:00 PM)  Hemoglobin A2:  2.2 (11/26/2009 3:15:00 PM)  Hemoglobin C:  0.0 (11/26/2009 3:15:00 PM)  Hemoglobin S:  0.0 (11/26/2009 3:15:00 PM)  Chlamydia: negative  Gonorrhea: negative  Initial Lab Comments: 11/25/09: 1h GCT 137    8-20 Week Labs:   1st Trimester Aneuploidy Risk Assessment  Down's Risk 1:  >10000  Trisomy 13/18 1: >10000  PAPP-A MoM:  .51  Free beta hCG MoM:  .64    [place] REVIEW  US# Date GA EstFetal Wt % Placenta Fluid Anatomy Recommendations Comments   1 11/25/2009 9 1/7       EDD    2 12/16/2009 12 1/7      NT results pending; comprehensive fetal survey 18/20 weeks FHR 150; IUP 12 1/7 weeks with EDD 06/29/10 with NT measurement 1.3   3 02/12/2010 20 3/7 406  anterior/left normal breech as indicated    4            5            6  Assessment   24 Years Old Female      G: 4  P 3:     Term: 3   Preterm: 0   Ab: 0   # Living Children: 3  SAB: 0  EAB: 0  ECT: 0  GA: 21 weeks       EDD: 06/29/2010    Status of Existing Problems  Assessed PREGNANCY, MULTIGRAVIDA as comment only - 25 G4P3003 at [redacted] wks GA.  Doing well.  Has had 2 early GCTs, 137 and 126.  Will repeat at next  visit.  RTC in 4 wks. Sheralyn Boatman MD - Signed    Today's Orders   Urine Culture   (UC) [phone]) [[id number]]    Disposition:   Clinic: Resident COC  in 4 week(s)   Appointment Reason: ROB    *Patient Care Summary printed and given to patient.      Nature conservation officer   I discussed with resident and agree with resident's findings and plan as documented in resident's note.    HPI: +fm, no lof no vag bleeding no problems  A&P IUP at 21 weeks, prior c/s x 3, needs third trimester gct at 28 weeks. return to cinic in 4 weeks. Repeat c/s at 39 weeks.    Preceptor: Laqueta Linden MD on February 19, 2010 10:18 AM      Patient Education   1st Trimester Education:  HIV/Prenatal tests, prenatal history risk factors, course of prenatal care, exercise/nutrition/wt gain, toxoplasmosis (cat/raw meat), sexual activity, exercise, smoking counseling, environment/work hazards, travel/seatbelts, tobacco/alcohol/drugs, use of medications, indications for ultrasound, domestic violence, childbirth classes, hospital facilities        RN exit  all questions asked and answered  pt to RTC in 4 weeks for ROB visit...................................................................Marland KitchenMarcelle Smiling Mixon RN  February 16, 2010 5:29 PM                ]

## 2010-02-16 NOTE — Unmapped (Signed)
Signed by Erskine Speed MD on 02/19/2010 at 14:05:52  Patient: Diane Stafford  Note: All result statuses are Final unless otherwise noted.    Tests: (1) URINE CULTURE (UC)  ! Clinical Report           FINAL Report      Specimen: MIDSTREAM URINE  Collected: 02/16/2010 17:30     Status: Final      Last Updated: 02/18/2010 23:16                Culture Result: (Final)      >100,000 cfu/mL      Klebsiella pneumoniae       Organism 2: (Final)      Performing Site: [facility] [address] [place] [address].      [facility] [phone], CLIA #:[id number].                               Culture Result:                            Klebsiella pneumoniae                            --------------------------------    MIC(mcg/ml)      Ampicillin            16         Resistant      Cefazolin             <=4        Susceptible      Ceftriaxone           <=1        Susceptible      Cefepime              <=1        Susceptible      Imipenem              <=1        Susceptible      Ciprofloxacin         <=0.25     Susceptible      Gentamicin            <=1        Susceptible      Tobramycin            <=1        Susceptible      Nitrofurantoin        <=16       Susceptible      Sulfa/Trimeth         <=20       Susceptible          Note: An exclamation mark (!) indicates a result that was not dispersed into   the flowsheet.  Document Creation Date: 02/18/2010 11:17 PM  _______________________________________________________________________    (1) Order result status: Final  Collection or observation date-time: 02/16/2010 17:30  Requested date-time: 02/16/2010 17:30  Receipt date-time: 02/16/2010 22:23  Reported date-time: 02/18/2010 23:16  Referring Physician: AMY MIYOSHI  Ordering Physician: Orland Penman [facility])  Specimen Source: UM&MIDSTREAM URINE     UGRS&Boric Acid, Small Urine Tube  Source: [name] Order Number: [id number]  Lab site: [facility]      [address]  [place] [address]   [phone]

## 2010-02-16 NOTE — Unmapped (Signed)
Signed by Roxanne Mins MD on 02/18/2010 at 17:41:12  Patient: Diane Stafford  Note: All result statuses are Final unless otherwise noted.    Tests: (1) POC URINALYSIS (POCUA)    Glucose, Urine            Negative mg/dL              Negative    Bilirubin, Urine          Negative                    Negative    Ketone               [A]  Trace mg/dL                 Negative    Specific Gravity          >=1.030                     1.005-1.035    Blood                     Negative                    Negative    pH                        6.5                         5.0-8.0    Protein, urine       [A]  Trace mg/dL                 Negative    Urobilinogen              0.2 mg/dL                   0.9-8.1    Nitrite              [A]  Positive                    Negative    Leukocyte Esterase        Negative                    Negative    Note: An exclamation mark (!) indicates a result that was not dispersed into   the flowsheet.  Document Creation Date: 02/16/2010 3:58 PM  _______________________________________________________________________    (1) Order result status: Final  Collection or observation date-time: 02/16/2010 15:55  Requested date-time: 02/16/2010 15:55  Receipt date-time: 02/16/2010 15:17  Reported date-time: 02/16/2010 15:57  Referring Physician: Hattie Pine MIYOSHI  Ordering Physician: Gorden Stthomas MIYOSHI [facility])  Specimen Source: U&URINE     UACUP&URINALYSIS CONTAINER  Source: [name] Order Number: [id number]  Lab site: [facility]      [address]      [place] [address]  [phone]      -----------------    The following lab values were dispersed to the flowsheet  with no units conversion:      Urobilinogen, 0.2 MG/DL, (F)  expected units: E units/dL    -----------------    The following non-numeric lab results were dispersed to  the flowsheet even though numeric results were expected:      Glucose, Urine, Negative  Specific Gravity, >=1.030

## 2010-02-18 NOTE — Unmapped (Signed)
Signed by Roxanne Mins MD on 02/18/2010 at 17:41:12    PHONE NOTE    PHONE NOTE  Call placed to: Patient  Reason for Call: discuss lab or test results  Details for Reason: [phone] pharmacy number  called in prescription for Keflex for 7 days. pt unable to tolerate macrobid. discussed with pt importance of compliance. Explained that antibiotic may need to be changed if the sensitivies come back differently.  Call placed by: Roxanne Mins MD,  February 18, 2010 5:41 PM

## 2010-03-16 LAB — POC URINALYSIS
Bilirubin Urine: NEGATIVE
Glucose, UA: NEGATIVE g/dL
Ketones, UA: NEGATIVE
Leukocyte Esterase, UA: NEGATIVE
Nitrite, UA: NEGATIVE
Protein, UA: NEGATIVE
RBC, UA: NEGATIVE
Specific Gravity, UA: 1.015 (ref 1.005–1.035)
Urobilinogen, UA: 0.2 U/dL (ref 0.2–1.0)
pH, UA: 6 (ref 5.0–8.0)

## 2010-03-16 LAB — OFFICE VISIT LAB RESULTS
Hematocrit: 38.6
Hemoglobin: 13.2
MCV: 82.2

## 2010-03-16 NOTE — Unmapped (Signed)
Signed by Roxanne Mins MD on 03/16/2010 at 09:15:34  Patient: Diane Stafford  Note: All result statuses are Final unless otherwise noted.    Tests: (1) POC URINALYSIS (POCUA)    Glucose, Urine            Negative mg/dL              Negative    Bilirubin, Urine          Negative                    Negative    Ketone                    Negative mg/dL              Negative    Specific Gravity          1.015                       1.005-1.035    Blood                     Negative                    Negative    pH                        6.0                         5.0-8.0    Protein, urine            Negative mg/dL              Negative    Urobilinogen              0.2 mg/dL                   0.3-5.0    Nitrite                   Negative                    Negative    Leukocyte Esterase        Negative                    Negative    Note: An exclamation mark (!) indicates a result that was not dispersed into   the flowsheet.  Document Creation Date: 03/16/2010 8:32 AM  _______________________________________________________________________    (1) Order result status: Final  Collection or observation date-time: 03/16/2010 08:30  Requested date-time: 03/16/2010 08:30  Receipt date-time: 03/16/2010 07:52  Reported date-time: 03/16/2010 08:31  Referring Physician: Deltha Bernales MIYOSHI  Ordering Physician: Orland Penman [facility])  Specimen Source: U&URINE     UACUP&URINALYSIS CONTAINER  Source: [name] Order Number: [id number]  Lab site: [facility]      [address]      [place] [address]  [phone]      -----------------    The following lab values were dispersed to the flowsheet  with no units conversion:      Urobilinogen, 0.2 MG/DL, (F)  expected units: E units/dL    -----------------    The following non-numeric lab results were dispersed to  the flowsheet even though numeric results were expected:      Glucose,  Urine, Negative

## 2010-03-16 NOTE — Unmapped (Signed)
Signed by Lowella Petties MD on 03/17/2010 at 14:58:45    PRENATAL VISIT      Reason for Visit   Chief Complaint: Return OB Visit  History from: patient    Allergies  No Known Allergies    Medications  PRENATAL VITAMINS  TABS (PRENATAL MV & MIN W/FE-FA TABS)         Vital Signs   Height: 61 in.   Weight: 222.5 lbs.   Pre-Pregnancy Weight: 220 lbs.  Pulse rate: 71   Temperature: 97.8 degrees  F   Blood Pressure   BP #1: 103 / 74mm Hg  Cuff Size: Std     Pain:     Have you had pain other than everyday aches and pains (e.g., mild headache, back ache, strains) in the past week?   No    Intake recorded by: Hilton Cork MA on March 16, 2010 8:32 AM                   Menses History:   [facility] Delivery: [facility]  First day LMP: 09/22/2009  This date is: approximate  Menses monthly: yes  Frequency:  32 days  Menarche: 10  Contraception used at conception: None  Planned pregnancy: no  Pregnancy confirmed by: home pregnancy test  G: 4  P 3:     Term: 3   Preterm: 0   Ab: 0   # Living Children: 3  SAB: 0  EAB: 0  ECT: 0  Past History  Surgical History (reviewed - no changes required):  2-Wisdom teeth removed 2010, Cesarean Section: x3    Family History (reviewed - no changes required): MGM - Heart Dz  passed HTN,aorta valve problem,Altermeizers  MGF - Aneurysim Brain,HTN passed away  Aunt - 1-pSYCH ISSUES  Uncle - 2- have had Open Heart Surgery 2- HTN  Social History (reviewed - no changes required): Preferred Language: English,   Marital Status: single,   Spouse: FOB Haematologist  Children: 3,   Employment Status: homemaker,   Patient Lives at: apartment,   Support System: excellent  Exercise: walking,   Caffeine per Day: <1  Seatbelt Use: 100 % of the time  Alcohol Use: none  Drug Use: none  Tobacco Usage:prior smoker  Sexually Active: Yes      Other Pertinent History:   Pre-Pregnacy Tobacco Use per Day: 0  Pregnancy Tobacco Use per Day: 0  Pre-Pregnacy Alcohol Use per Day: 0  Pregnancy Alcohol Use per Day:  0  Pre-Pregnancy Illicit/Recreational Drug Use per Day: 0  Pregnancy Illicit/Recreational Drug Use per Day: 0  D(Rh) Sensitized: No  Latex Allergy: No  Betadine Allergy: No  Anesthetic Complications No  History of Abnormal Pap: No  Uterine Anomaly: No  History of Infertility: No  DES Exposure: No  ART Treatment: No      Infection History:   Exposed to TB: no   History of Genital Herpes: no   Sexual partner w/hx of oral or genital HSV: no  Rash or Viral Illness since LMP: no  Hepatitis B: no  Vaccinated for Hepatits B: no  Hepatitis C: no  History of Varicella Vaccine: no  History of Varicella Infection: no  History of STD: no  History of PID: no  Do you care for any cats: no    Genetic Testing/Teratology Counseling:   Patient's Age>= 35 Years as of Estimated Date of Delivery: no  Thalassemia Risk: no  Neural Tube Defect: no  Congenital  Heart Defect: no   Down Syndrome: no  Tay-Sachs: no  Canavan Disease: no  Familial Dysautonomia: no  Sickle Cell Disease/Trait: no  Hemophilia/Blood Disorders: no  Muscular Dystrophy: no  Cystic Fibrosis: no  Huntington's Chorea: no  Mental Retardation/Autism no  Tested for Fragile X: no  Other Genetic or Chromosomal: no  Maternal Metabolic Disorder: no  Other birth defects: no  Recurrent Pregnancy Loss/Stillbirth: no  Medications/Drugs/Alcohol since LMP: no  Previous Genetic testing for Cystic Fibrosis: no  Previous Genetic testing father of baby: no    EDD:   LMP: 09/22/2009    EDD by LMP: 06/29/2010  1st [place] date: 11/25/2009 = 9  weeks, 1  days  EDD: 06/29/2010 by LMP    Today's EGA in weeks: 25    Dating Comments:  EDD 06/29/10 per 9 wk sono = lmp  ..................................................................Marland KitchenMaris Berger CNM  November 26, 2009 8:25 AM      History of Present Illness:   no complaints    Symptoms:   Denies: back pain, change in discharge, constipation, fatigue, headache, heartburn, nausea, vomiting, pelvic pressure, preterm labor symptoms, swelling, uterine  activity, vaginal bleeding, loss of fluid, concerns for personal safety  Fetal movement: active baby    Prenatal Exam:     Weight    Pre-Pregnancy Weight: 220 lbs.  Total weight change: 2.50 lbs.  Weight change since last visit: 3.40 lbs.    Exam   Est. Gestational Age: 30  Fundal height (cm): 27  FHR: 140's  Edema: none    List of Diagnoses/Issues:   - h/o C/S X 3  - BMI 41 (early and scheduled GCT normal)          Declines SSI study   - desires permanent sterlization; sign tubal papers 03/16/10  - Klebsiella UTI (finished Rx 7/22)    Fetal Well  Being:   Start antenatal fetal surveillance at 32 weeks.   Serial ultrasounds for fetal growth  for BMI >40    Plan of Care:   - continue exercise in pregnancy  - Ucx today for TOC        LABS REVIEWED    Initial Labs:   Blood Type: O (11/25/2009 1:40:00 PM)  D (Rh) Type: Positive (11/25/2009 1:40:00 PM)  Antibody Screen: Negative (11/25/2009 1:42:00 PM)  Hemoglobin: 13.2 (11/25/2009 12:28:00 PM)  Hematocrit: 38.6 (11/25/2009 12:28:00 PM)  MCV: 82.2 (11/25/2009 12:28:00 PM)  Pap Smear: NIL  Rubella: IMMUNE (11/25/2009 5:54:00 PM)  RPR: Non Reactive (11/26/2009 4:11:00 AM)  Urine Culture: Positive (E.Coli)  HBsAg: NON-REACTIVE (11/25/2009 5:54:00 PM)  HIV: Negative (11/25/2009 1:31:00 PM)  Hgb Electrophoresis Phenotype: Normal  Hemoglobin F:  0.0 (11/26/2009 3:15:00 PM)  Hemoglobin A2:  2.2 (11/26/2009 3:15:00 PM)  Hemoglobin C:  0.0 (11/26/2009 3:15:00 PM)  Hemoglobin S:  0.0 (11/26/2009 3:15:00 PM)  Chlamydia: negative  Gonorrhea: negative  Initial Lab Comments: 11/25/09: 1h GCT 137    8-20 Week Labs:   1st Trimester Aneuploidy Risk Assessment  Down's Risk 1:  >10000  Trisomy 13/18 1: >10000  PAPP-A MoM:  .51  Free beta hCG MoM:  .64    24-28 Week Labs:   Hemoglobin: 13.2 (11/25/2009 12:28:00 PM)  Hematocrit: 38.6 (11/25/2009 12:28:00 PM)  MCV: 82.2 (11/25/2009 12:28:00 PM)  1 Hr Glucose Challenge Test: 128 (02/12/2010 12:13:00 PM)        Assessment   24 Years Old Female       G: 4  P 3:  Term: 3   Preterm: 0   Ab: 0   # Living Children: 3  SAB: 0  EAB: 0  ECT: 0  GA: 25 weeks       EDD: 06/29/2010    New Problems:   OBESITY NOS (ICD-278.00)      Status of Existing Problems  Assessed OBESITY NOS as comment only -   ANFS starting at 32wga  every 4 week growths  RTC 3-4wks - Amy D Miyoshi MD - Signed  Assessed UTI as comment only -   Ucx today - Amy D Miyoshi MD - Signed  Assessed PREVIOUS C-SECT DELIVERY X3 as comment only -   plan for repeat at 39wga  desires tubal sterlization - Amy D Miyoshi MD - Signed  Assessed PREGNANCY, MULTIGRAVIDA as comment only -   preterm labor and fetal kick count precautions  repeat 1h GCT next visit  encouraged continued exercise; doing great with minimal weight gain this pregnancy  sign tubal papers today  RTC 3-4 weeks  - Amy D Miyoshi MD - Signed    Today's Orders   OB US for Fetal Growth Assessment or Follow up of Anatomy [[id number]]  BPP with NST [[id number]]  Urine Culture   (UC) (395) [[id number]]    Disposition:   Clinic: Resident COC  Return to clinic for Provider Visit in 3-4 week(s)   Appointment Reason: ROB    *Patient Care Summary printed and given to patient.      Nature conservation officer   I discussed with resident and agree with resident's findings and plan as documented in resident's note.    A&P Approximately 15 minutes was spent with this patient, over 10 minutes in face-to-face consultation.       Patient Education   1st Trimester Education:  HIV/Prenatal tests, prenatal history risk factors, course of prenatal care, exercise/nutrition/wt gain, toxoplasmosis (cat/raw meat), sexual activity, exercise, smoking counseling, environment/work hazards, travel/seatbelts, tobacco/alcohol/drugs, use of medications, indications for ultrasound, domestic violence, childbirth classes, hospital facilities        RN Exit: Fetal Growth Scans every 4 wks begin Weds 03/17/10 at 10am.               BPP's weekly begin x 7wks Weds 05/05/10 at 8am.  8/10-patient to get copy of appt's.               ROB Mon 04/05/10 at 10:15am Dr. Sloan Leiter.               BTL Protocol completed with copy to patient and copy to be scanned into the emr.               Urine Culture sent. ..................................................................Marland KitchenLauralyn Primes RN  March 16, 2010 9:37 AM                ]

## 2010-03-16 NOTE — Unmapped (Signed)
Signed by Roxanne Mins MD on 03/16/2010 at 00:00:00  BILATERAL TUBAL LIGATION       Imported By: Georgianne Fick 03/16/2010 10:45:51    _____________________________________________________________________    External Attachment:    Please see [employer] EMR for this document.

## 2010-03-16 NOTE — Unmapped (Signed)
Signed by Roxanne Mins MD on 03/18/2010 at 12:39:32  Patient: Diane Stafford  Note: All result statuses are Final unless otherwise noted.    Tests: (1) URINE CULTURE (UC)  ! Clinical Report           FINAL Report      Specimen: MIDSTREAM URINE  Collected: 03/16/2010 10:00     Status: Final      Last Updated: 03/18/2010 12:18                Culture Result: (Final)      Mixed Skin/Urogenital Flora.  No Further Workup.          Note: An exclamation mark (!) indicates a result that was not dispersed into   the flowsheet.  Document Creation Date: 03/18/2010 12:18 PM  _______________________________________________________________________    (1) Order result status: Final  Collection or observation date-time: 03/16/2010 10:00  Requested date-time: 03/16/2010 10:00  Receipt date-time: 03/17/2010 00:56  Reported date-time: 03/18/2010 12:18  Referring Physician: Electra Paladino MIYOSHI  Ordering Physician: Denajah Farias MIYOSHI (MIYOSHAD)  Specimen Source: UM&MIDSTREAM URINE     UGRS&Boric Acid, Small Urine Tube  Source: [name] Order Number: [id number]  Lab site: [facility]      [address]      [place] [address]  [phone]

## 2010-03-17 NOTE — Unmapped (Signed)
Signed by Wilhelmenia Blase MD on 04/05/2010 at 11:06:19  OB US      Imported By: Georgianne Fick 03/17/2010 17:41:55    _____________________________________________________________________    External Attachment:    Please see [employer] EMR for this document.

## 2010-04-05 LAB — GTT60 MIN: Glucose, GTT 1 HR: 85 mg/dL

## 2010-04-05 LAB — POC URINALYSIS
Bilirubin Urine: NEGATIVE
Glucose, UA: NEGATIVE g/dL
Ketones, UA: NEGATIVE
Leukocyte Esterase, UA: NEGATIVE
Nitrite, UA: NEGATIVE
Protein, UA: NEGATIVE
RBC, UA: NEGATIVE
Specific Gravity, UA: 1.02 (ref 1.005–1.035)
Urobilinogen, UA: 0.2 U/dL (ref 0.2–1.0)
pH, UA: 6.5 (ref 5.0–8.0)

## 2010-04-05 NOTE — Unmapped (Signed)
Signed by Lorin Glass DO on 04/05/2010 at 14:30:07    PRENATAL VISIT      Reason for Visit   Chief Complaint: Return OB Visit  History from: patient    Allergies  No Known Allergies    Medications  PRENATAL VITAMINS  TABS (PRENATAL MV & MIN W/FE-FA TABS)         Vital Signs   Height: 61 in.   Weight: 225.8 lbs.   Pre-Pregnancy Weight: 220 lbs.    Wt change since last visit: 3.30 lbs      Total Preg Wt Change: 5.80 lbs   BMI (in-lb): 42.82   BSA (m2): 1.99  Pulse rate: 85   Temperature: 97./8 degrees  F   Blood Pressure   BP #1: 120 / 80mm Hg  Cuff Size: Std     Pain:     Have you had pain other than everyday aches and pains (e.g., mild headache, back ache, strains) in the past week?   No    Intake recorded by: Collier Salina MA on April 05, 2010 10:39 AM                   Menses History:   Hospital [facility] Delivery: [facility]  First day LMP: 09/22/2009  This date is: approximate  Menses monthly: yes  Frequency:  32 days  Menarche: 10  Contraception used at conception: None  Planned pregnancy: no  Pregnancy confirmed by: home pregnancy test  G: 4  P 3:     Term: 3   Preterm: 0   Ab: 0   # Living Children: 3  SAB: 0  EAB: 0  ECT: 0    Other Pertinent History:   Pre-Pregnacy Tobacco Use per Day: 0  Pregnancy Tobacco Use per Day: 0  Pre-Pregnacy Alcohol Use per Day: 0  Pregnancy Alcohol Use per Day: 0  Pre-Pregnancy Illicit/Recreational Drug Use per Day: 0  Pregnancy Illicit/Recreational Drug Use per Day: 0  D(Rh) Sensitized: No  Latex Allergy: No  Betadine Allergy: No  Anesthetic Complications No  History of Abnormal Pap: No  Uterine Anomaly: No  History of Infertility: No  DES Exposure: No  ART Treatment: No      Infection History:   Exposed to TB: no   History of Genital Herpes: no   Sexual partner w/hx of oral or genital HSV: no  Rash or Viral Illness since LMP: no  Hepatitis B: no  Vaccinated for Hepatits B: no  Hepatitis C: no  History of Varicella Vaccine: no  History of Varicella Infection:  no  History of STD: no  History of PID: no  Do you care for any cats: no    Genetic Testing/Teratology Counseling:   Patient's Age>= 35 Years as of Estimated Date of Delivery: no  Thalassemia Risk: no  Neural Tube Defect: no  Congenital Heart Defect: no   Down Syndrome: no  Tay-Sachs: no  Canavan Disease: no  Familial Dysautonomia: no  Sickle Cell Disease/Trait: no  Hemophilia/Blood Disorders: no  Muscular Dystrophy: no  Cystic Fibrosis: no  Huntington's Chorea: no  Mental Retardation/Autism no  Tested for Fragile X: no  Other Genetic or Chromosomal: no  Maternal Metabolic Disorder: no  Other birth defects: no  Recurrent Pregnancy Loss/Stillbirth: no  Medications/Drugs/Alcohol since LMP: no  Previous Genetic testing for Cystic Fibrosis: no  Previous Genetic testing father of baby: no    EDD:   Pregnancy Dating Information:    LMP: 09/22/2009  EDD by LMP: 06/29/2010  1st Korea date: 11/25/2009 = 9  weeks, 1  days  EDD by 1st Korea: 06/29/2010  EDD: 06/29/2010 by LMP    Today's EGA in weeks: 27 6/7    Dating Comments:  EDD 06/29/10 per 9 wk sono = lmp  ..................................................................Marland KitchenMaris Berger CNM  November 26, 2009 8:25 AM      History of Present Illness:   Pt has no complaints. Occasional BH contractions.    Symptoms:   Denies: pelvic pressure, preterm labor symptoms, swelling, uterine activity, vaginal bleeding, loss of fluid  Fetal movement: active baby    Prenatal Exam:     Weight    Pre-Pregnancy Weight: 220 lbs.    Exam   Est. Gestational Age: 80 6/7  Fundal height (cm): 31  FHR: 140's  Edema: --    List of Diagnoses/Issues:   - h/o C/S X 3  - BMI 41 (early and scheduled GCT normal)          Declines SSI study   - desires permanent sterlization; sign tubal papers 03/16/10  - Klebsiella UTI (finished Rx 7/22)    Fetal Well  Being:   Start antenatal fetal surveillance at 32 weeks.   Serial ultrasounds for fetal growth    Plan of Care:   - continue exercise in pregnancy  - Ucx today  for TOC      Initial Labs:   Blood Type: O (11/25/2009 1:40:00 PM)  D (Rh) Type: Positive (11/25/2009 1:40:00 PM)  Antibody Screen: Negative (11/25/2009 1:42:00 PM)  Hemoglobin: 13.2 (11/25/2009 12:28:00 PM)  Hematocrit: 38.6 (11/25/2009 12:28:00 PM)  MCV: 82.2 (11/25/2009 12:28:00 PM)  Pap Smear: NIL  Rubella: IMMUNE (11/25/2009 5:54:00 PM)  RPR: Non Reactive (11/26/2009 4:11:00 AM)  Urine Culture: Positive (E.Coli)  HBsAg: NON-REACTIVE (11/25/2009 5:54:00 PM)  HIV: Negative (11/25/2009 1:31:00 PM)  Hgb Electrophoresis Phenotype: Normal  Hemoglobin F:  0.0 (11/26/2009 3:15:00 PM)  Hemoglobin A2:  2.2 (11/26/2009 3:15:00 PM)  Hemoglobin C:  0.0 (11/26/2009 3:15:00 PM)  Hemoglobin S:  0.0 (11/26/2009 3:15:00 PM)  Chlamydia: negative  Gonorrhea: negative  Initial Lab Comments: 11/25/09: 1h GCT 137    8-20 Week Labs:   1st Trimester Aneuploidy Risk Assessment  Down's Risk 1:  >10000  Trisomy 13/18 1: >10000  PAPP-A MoM:  .51  Free beta hCG MoM:  .64    24-28 Week Labs:   Hemoglobin: 13.2 (11/25/2009 12:28:00 PM)  Hematocrit: 38.6 (11/25/2009 12:28:00 PM)  MCV: 82.2 (11/25/2009 12:28:00 PM)  1 Hr Glucose Challenge Test: 128 (02/12/2010 12:13:00 PM)        Assessment   24 Years Old Female      G: 4  P 3:     Term: 3   Preterm: 0   Ab: 0   # Living Children: 3  SAB: 0  EAB: 0  ECT: 0  GA: 27 6/7 weeks       EDD: 06/29/2010    New Problems:   OBES COMP PG BIRTH/THE PP ANTEPARTUM COND/COMP (NWG-956.21)      Status of Existing Problems  Assessed UTI as improved - Lorin Glass DO  Assessed PREVIOUS C-SECT DELIVERY X3 as unchanged - Lorin Glass DO  Assessed PREGNANCY, MULTIGRAVIDA as comment only - 1 hr GCT today  ROB in 2 weeks  Serial Korea for obesity - Lorin Glass DO    Today's Orders   Glucose, Postprand  1 hr  (GCH1HR) (8477) [CPT-82950]  Disposition:   Clinic: Resident COC  in 2 week(s)   Appointment Reason: rob    *Patient Care Summary printed and given to patient.      Preceptor  Acknowledgements    I saw and examined the patient.  I discussed with the resident or fellow and agree with resident's/fellow's findings and plan as documented in the note.  HPI: Return Obstetrical visit  27+ week IUP  Prior cesarean X 3  desires permanent sterilization, papers signed  BMI > 40  Physical Exam: FH 31 cm  fhts 140s  A&P serial growth scans  antenatal fetal surveillance starting at 32 weeks  return to clinic 2 weeks  Preterm labor warnings/fetal movement guidelines discussed.     Preceptor: Wilhelmenia Blase MD on April 05, 2010 11:21 AM    Delivery Plan:     Delivery:   Permanent Sterilization Consent Signed on: 03/16/2010  Sterilization Consent Valid on: 04/16/2010    Patient Education   1st Trimester Education:  HIV/Prenatal tests, prenatal history risk factors, course of prenatal care, exercise/nutrition/wt gain, toxoplasmosis (cat/raw meat), sexual activity, exercise, smoking counseling, environment/work hazards, travel/seatbelts, tobacco/alcohol/drugs, use of medications, indications for ultrasound, domestic violence, childbirth classes, hospital facilities                ]

## 2010-04-05 NOTE — Unmapped (Signed)
Signed by Lorin Glass DO on 04/06/2010 at 13:55:56  Patient: Jaymes Graff  Note: All result statuses are Final unless otherwise noted.    Tests: (1) GLUCOSE CHALLENGE, 1 HOUR (GCH1HR)    Glucose, 1 Hr             85 mg/dL        1 HOUR GLUCOSE CHALLENGE INTERPRETATION:     Pregnant Women/ Non Pregnant Adult - 50 gram glucose load screening for   gestational diabetes      Expected normal response: A one hour glucose result of less than 140   mg/dL.    Note: An exclamation mark (!) indicates a result that was not dispersed into   the flowsheet.  Document Creation Date: 04/05/2010 3:10 PM  _______________________________________________________________________    (1) Order result status: Final  Collection or observation date-time: 04/05/2010 11:42  Requested date-time: 04/05/2010 11:42  Receipt date-time: 04/05/2010 14:05  Reported date-time: 04/05/2010 15:09  Referring Physician: AMY MIYOSHI  Ordering Physician: Shauna Hugh HOOK (VANHOOCL)  Specimen Source: P&PLASMA     GRN45&LT GRN PST LITH HEP (4.5 mL)  Source: [name] Order Number: [id number]  Lab site: [facility]      [address]      [place] [address]  [phone]

## 2010-04-05 NOTE — Unmapped (Signed)
Signed by Lorin Glass DO on 04/05/2010 at 16:10:56  Patient: Diane Stafford  Note: All result statuses are Final unless otherwise noted.    Tests: (1) POC URINALYSIS (POCUA)    Glucose, Urine            Negative mg/dL              Negative    Bilirubin, Urine          Negative                    Negative    Ketone                    Negative mg/dL              Negative    Specific Gravity          1.020                       1.005-1.035    Blood                     Negative                    Negative    pH                        6.5                         5.0-8.0    Protein, urine            Negative mg/dL              Negative    Urobilinogen              0.2 mg/dL                   1.8-2.9    Nitrite                   Negative                    Negative    Leukocyte Esterase        Negative                    Negative    Note: An exclamation mark (!) indicates a result that was not dispersed into   the flowsheet.  Document Creation Date: 04/05/2010 11:45 AM  _______________________________________________________________________    (1) Order result status: Final  Collection or observation date-time: 04/05/2010 11:43  Requested date-time: 04/05/2010 11:43  Receipt date-time: 04/05/2010 11:06  Reported date-time: 04/05/2010 11:45  Referring Physician: AMY MIYOSHI  Ordering Physician: Alva Garnet [facility])  Specimen Source: U&URINE     UACUP&URINALYSIS CONTAINER  Source: [name] Order Number: [id number]  Lab site: [facility]      [address]      [place] [address]  [phone]      -----------------    The following lab values were dispersed to the flowsheet  with no units conversion:      Urobilinogen, 0.2 MG/DL, (F)  expected units: E units/dL    -----------------    The following non-numeric lab results were dispersed to  the flowsheet even though numeric results were expected:  Glucose, Urine, Negative

## 2010-04-14 NOTE — Unmapped (Signed)
Signed by Roxanne Mins MD on 04/15/2010 at 09:59:57  OB US      Imported By: Georgianne Fick 04/14/2010 17:53:34    _____________________________________________________________________    External Attachment:    Please see [facility] EMR for this document.

## 2010-04-15 NOTE — Unmapped (Signed)
Signed by Roxanne Mins MD on 04/15/2010 at 10:01:27      Initial Labs:   Blood Type: O (11/25/2009 1:40:00 PM)  D (Rh) Type: Positive (11/25/2009 1:40:00 PM)  Antibody Screen: Negative (11/25/2009 1:42:00 PM)  Hemoglobin: 13.2 (11/25/2009 12:28:00 PM)  Hematocrit: 38.6 (11/25/2009 12:28:00 PM)  MCV: 82.2 (11/25/2009 12:28:00 PM)  Pap Smear: NIL  Rubella: IMMUNE (11/25/2009 5:54:00 PM)  RPR: Non Reactive (11/26/2009 4:11:00 AM)  Urine Culture: Positive (E.Coli)  HBsAg: NON-REACTIVE (11/25/2009 5:54:00 PM)  HIV: Negative (11/25/2009 1:31:00 PM)  Hgb Electrophoresis Phenotype: Normal  Hemoglobin F:  0.0 (11/26/2009 3:15:00 PM)  Hemoglobin A2:  2.2 (11/26/2009 3:15:00 PM)  Hemoglobin C:  0.0 (11/26/2009 3:15:00 PM)  Hemoglobin S:  0.0 (11/26/2009 3:15:00 PM)  Chlamydia: negative  Gonorrhea: negative  Initial Lab Comments: 11/25/09: 1h GCT 137    8-20 Week Labs:   1st Trimester Aneuploidy Risk Assessment  Down's Risk 1:  >10000  Trisomy 13/18 1: >10000  PAPP-A MoM:  .51  Free beta hCG MoM:  .64    24-28 Week Labs:   Hemoglobin: 13.2 (11/25/2009 12:28:00 PM)  Hematocrit: 38.6 (11/25/2009 12:28:00 PM)  MCV: 82.2 (11/25/2009 12:28:00 PM)  1 Hr Glucose Challenge Test: 128 (02/12/2010 12:13:00 PM)

## 2010-04-19 LAB — POC URINALYSIS
Bilirubin Urine: NEGATIVE
Glucose, UA: NEGATIVE g/dL
Ketones, UA: NEGATIVE
Leukocytes, UA: NEGATIVE
Nitrite, UA: NEGATIVE
Protein, UA: NEGATIVE
RBC, UA: NEGATIVE
Specific Gravity, UA: 1.025 (ref 1.005–1.035)
Urobilinogen, UA: 0.2 E units/dL (ref 0.2–1.0)
pH, UA: 6.5 (ref 5.0–8.0)

## 2010-04-19 LAB — CHLAMYDIA/N.GONORRHOEAE DNA, NAAT: Chlamydia Trachomatis DNA, SDA, Pap Vial: NEGATIVE

## 2010-04-19 NOTE — Unmapped (Signed)
Signed by Altamese Cabal Armon Orvis MD on 04/19/2010 at 14:06:45  Patient: Diane Stafford  Note: All result statuses are Final unless otherwise noted.    Tests: (1) POC URINALYSIS (POCUA)    Glucose, Urine            Negative mg/dL              Negative    Bilirubin, Urine          Negative                    Negative    Ketone                    Negative mg/dL              Negative    Specific Gravity          1.025                       1.005-1.035    Blood                     Negative                    Negative    pH                        6.5                         5.0-8.0    Protein, urine            Negative mg/dL              Negative    Urobilinogen              0.2 mg/dL                   1.3-0.8    Nitrite                   Negative                    Negative    Leukocyte Esterase        Negative                    Negative    Note: An exclamation mark (!) indicates a result that was not dispersed into   the flowsheet.  Document Creation Date: 04/19/2010 9:46 AM  _______________________________________________________________________    (1) Order result status: Final  Collection or observation date-time: 04/19/2010 09:24  Requested date-time: 04/19/2010 09:24  Receipt date-time: 04/19/2010 08:52  Reported date-time: 04/19/2010 09:45  Referring Physician: AMY MIYOSHI  Ordering Physician: Alva Garnet [facility])  Specimen Source: U&URINE     UACUP&URINALYSIS CONTAINER  Source: [name] Order Number: [id number]  Lab site: [facility]      [address]      [place] [address]  [phone]      -----------------    The following lab values were dispersed to the flowsheet  with no units conversion:      Urobilinogen, 0.2 MG/DL, (F)  expected units: E units/dL    -----------------    The following non-numeric lab results were dispersed to  the flowsheet even though numeric results were expected:  Glucose, Urine, Negative

## 2010-04-19 NOTE — Unmapped (Signed)
Signed by Altamese Cabal Tarron Krolak MD on 04/19/2010 at 11:46:28    PRENATAL VISIT      Reason for Visit   Chief Complaint: Return OB Visit  History from: patient    Allergies  No Known Allergies    Medications  PRENATAL VITAMINS  TABS (PRENATAL MV & MIN W/FE-FA TABS)         Vital Signs   Height: 61 in.   Weight: 231.3 lbs.   Pre-Pregnancy Weight: 220 lbs.    Wt change since last visit: 5.50 lbs   BMI (in-lb): 43.86   Pulse rate: 80   Temperature: 97.8 degrees  F   Blood Pressure   BP #1: 102 / 68mm Hg     Pain:     Have you had pain other than everyday aches and pains (e.g., mild headache, back ache, strains) in the past week?   No  Pain Scale: 6 out of 10    Intake recorded by: Emmaline Life MA on April 19, 2010 9:30 AM                   Menses History:   [facility] Delivery[facility]  First day LMP: 09/22/2009  This date is: approximate  Menses monthly: yes  Frequency:  32 days  Menarche: 10  Contraception used at conception: None  Planned pregnancy: no  Pregnancy confirmed by: home pregnancy test  G: 4  P 3:     Term: 3   Preterm: 0   Ab: 0   # Living Children: 3  SAB: 0  EAB: 0  ECT: 0    Other Pertinent History:   Pre-Pregnacy Tobacco Use per Day: 0  Pregnancy Tobacco Use per Day: 0  Pre-Pregnacy Alcohol Use per Day: 0  Pregnancy Alcohol Use per Day: 0  Pre-Pregnancy Illicit/Recreational Drug Use per Day: 0  Pregnancy Illicit/Recreational Drug Use per Day: 0  D(Rh) Sensitized: No  Latex Allergy: No  Betadine Allergy: No  Anesthetic Complications No  History of Abnormal Pap: No  Uterine Anomaly: No  History of Infertility: No  DES Exposure: No  ART Treatment: No      Infection History:   Exposed to TB: no   History of Genital Herpes: no   Sexual partner w/hx of oral or genital HSV: no  Rash or Viral Illness since LMP: no  Hepatitis B: no  Vaccinated for Hepatits B: no  Hepatitis C: no  History of Varicella Vaccine: no  History of Varicella Infection: no  History of STD: no  History of PID: no  Do  you care for any cats: no    Genetic Testing/Teratology Counseling:   Patient's Age>= 35 Years as of Estimated Date of Delivery: no  Thalassemia Risk: no  Neural Tube Defect: no  Congenital Heart Defect: no   Down Syndrome: no  Tay-Sachs: no  Canavan Disease: no  Familial Dysautonomia: no  Sickle Cell Disease/Trait: no  Hemophilia/Blood Disorders: no  Muscular Dystrophy: no  Cystic Fibrosis: no  Huntington's Chorea: no  Mental Retardation/Autism no  Tested for Fragile X: no  Other Genetic or Chromosomal: no  Maternal Metabolic Disorder: no  Other birth defects: no  Recurrent Pregnancy Loss/Stillbirth: no  Medications/Drugs/Alcohol since LMP: no  Previous Genetic testing for Cystic Fibrosis: no  Previous Genetic testing father of baby: no    Past Pregnancies:  G:  4 P:  3 A:  0  SAB: 0  EAB:  0  Term Deliveries:  3  Premature Deliveries:   Ectopic pregnancies:    H/O Multi-fetal Delivery:      Preg # Date Early Loss/ Termination GA Wks Lgth of Labor Brth Wt Sex Type of Del Anes Pl of Del PTL Fetal Comp Matrnl Comp Cmnts   1 03/04/02  FT 16HRS 7-4 F c-section epidural [facility] yes/brethine none No progress    2 12/15/03  39  8-6 M c-section spinal [facility] no none none    3 76/6/09  36  6-1 F c-section spinal [address] yes none none    4 current                     EDD:   LMP: 09/22/2009    EDD by LMP: 06/29/2010  1st [place] date: 11/25/2009 = 9  weeks, 1  days  EDD by 1st [place]: 06/29/2010  EDD: 06/29/2010 by LMP    Today's EGA in weeks: 29 6/7    Dating Comments:  EDD 06/29/10 per 9 wk sono = lmp  ..................................................................Marland KitchenMaris Berger CNM  November 26, 2009 8:25 AM      History of Present Illness:   24 yo G4P3003 at 67 and 6 by 46 w sono presents for ROB.  Pt is having irregular contractions daily.  The contractions do not occur at any regular time of the day.  Pt has felt fetal movement, denies bleeding/discharge, or leakage. Patient denies current pain.    Symptoms:    Complains of: back pain, fatigue, heartburn, pelvic pressure, swelling, uterine activity  Denies: change in discharge, constipation, headache, nausea, vomiting, vaginal bleeding, loss of fluid, concerns for personal safety  Comments: Back pain - resolves spontaneously  Heartburn -  Contractions - irregular, none currently  Fetal movement: active baby  Pain Scale: 6 out of 10    Prenatal Exam:     Weight    Pre-Pregnancy Weight: 220 lbs.    Exam   Est. Gestational Age: 58 6/7  Fundal height (cm): 31  FHR: 140's    List of Diagnoses/Issues:   - h/o C/S X 3  - BMI 41 (early and scheduled GCT normal), declines SSI study   - desires permanent sterlization; signed tubal papers 03/16/10  - Needs anesthesia consult      Fetal Well  Being:   Start antenatal fetal surveillance at 32 weeks.   Serial ultrasounds for fetal growth    Plan of Care:   BTL  Repeat C/S at 39 weeks        Initial Labs:   Blood Type: O (11/25/2009 1:40:00 PM)  D (Rh) Type: Positive (11/25/2009 1:40:00 PM)  Antibody Screen: Negative (11/25/2009 1:42:00 PM)  Hemoglobin: 13.2 (11/25/2009 12:28:00 PM)  Hematocrit: 38.6 (11/25/2009 12:28:00 PM)  MCV: 82.2 (11/25/2009 12:28:00 PM)  Pap Smear: NIL 11/25/09  Rubella: IMMUNE (11/25/2009 5:54:00 PM)  RPR: Non Reactive (11/26/2009 4:11:00 AM)  Urine Culture: Positive (E.Coli)  HBsAg: NON-REACTIVE (11/25/2009 5:54:00 PM)  HIV: Negative (11/25/2009 1:31:00 PM)  Hgb Electrophoresis Phenotype: Normal  Hemoglobin F:  0.0 (11/26/2009 3:15:00 PM)  Hemoglobin A2:  2.2 (11/26/2009 3:15:00 PM)  Hemoglobin C:  0.0 (11/26/2009 3:15:00 PM)  Hemoglobin S:  0.0 (11/26/2009 3:15:00 PM)  Chlamydia: negative 11/25/09  Gonorrhea: negative 11/25/09  Initial Lab Comments: 11/25/09: 1h GCT 137    8-20 Week Labs:   1st Trimester Aneuploidy Risk Assessment  Down's Risk 1:  >10000  Trisomy 13/18 1: >10000  PAPP-A MoM:  .51  Free  beta hCG MoM:  .64    24-28 Week Labs:   Hemoglobin: 13.2 (11/25/2009 12:28:00 PM)  Hematocrit: 38.6 (11/25/2009  12:28:00 PM)  MCV: 82.2 (11/25/2009 12:28:00 PM)  1 Hr Glucose Challenge Test: 128 (02/12/2010 12:13:00 PM)        Assessment   24 Years Old Female      G: 4  P 3:     Term: 3   Preterm: 0   Ab: 0   # Living Children: 3  SAB: 0  EAB: 0  ECT: 0  GA: 29 6/7 weeks       EDD: 06/29/2010    Status of Existing Problems  Assessed PREGNANCY, MULTIGRAVIDA as unchanged - 24 yo G4P3003 at 86 and 6 by 9 w sono  1. Pregnancy - PTL, lof, vb, fm precautions given  2. Obesity - normal early and timed 1 hour GCT, starting bpp at 32 w (scheduled)  3. BCM - pp BTL  4. 3 prior LTCS - repeat at 39 w  5. RTC in 4 weeks - Altamese Cabal Aidan Caloca MD - Signed    Disposition:   Clinic: Resident COC  in 4 week(s)     *Patient Care Summary printed and given to patient.      Nature conservation officer   I discussed with resident and agree with resident's findings and plan as documented in resident's note.    HPI: Return Obstetrical visit  29+ week IUP  prior cesarean X 3  BMI > 41  desires BTL  Physical Exam: see Dr. Sloan Leiter' exam  A&P already has antenatal testing scheduled and serial growth  return to clinic 4 weeks    Preceptor: Wilhelmenia Blase MD on April 19, 2010 10:04 AM    Delivery Plan:     Delivery:   Permanent Sterilization Consent Signed on: 03/16/2010  Sterilization Consent Valid on: 04/16/2010    Patient Education   1st Trimester Education:  HIV/Prenatal tests, prenatal history risk factors, course of prenatal care, exercise/nutrition/wt gain, toxoplasmosis (cat/raw meat), sexual activity, exercise, smoking counseling, environment/work hazards, travel/seatbelts, tobacco/alcohol/drugs, use of medications, indications for ultrasound, domestic violence, childbirth classes, hospital facilities

## 2010-05-05 NOTE — Unmapped (Signed)
Signed by Roxanne Mins MD on 05/06/2010 at 07:14:53  ANTENATAL TESTING RPT      Imported By: Georgianne Fick 05/05/2010 15:54:44    _____________________________________________________________________    External Attachment:    Please see [employer] EMR for this document.

## 2010-05-12 NOTE — Unmapped (Signed)
Signed by Roxanne Mins MD on 05/13/2010 at 08:19:34  OB US      Imported By: Georgianne Fick 05/12/2010 10:40:49    _____________________________________________________________________    External Attachment:    Please see [facility] EMR for this document.

## 2010-05-19 LAB — POC URINALYSIS
Bilirubin Urine: NEGATIVE
Glucose, UA: NEGATIVE g/dL
Ketones, UA: NEGATIVE
Nitrite, UA: NEGATIVE
Specific Gravity, UA: 1.01 (ref 1.005–1.035)
Urobilinogen, UA: 0.2 E units/dL (ref 0.2–1.0)
pH, UA: 7 (ref 5.0–8.0)

## 2010-05-19 NOTE — Unmapped (Signed)
Signed by Nicholes Mango MD on 05/24/2010 at 08:07:42  Patient: Diane Stafford  Note: All result statuses are Final unless otherwise noted.    Tests: (1) POC URINALYSIS (POCUA)    Glucose, Urine            Negative mg/dL              Negative    Bilirubin, Urine          Negative                    Negative    Ketone                    Negative mg/dL              Negative    Specific Gravity          1.010                       1.005-1.035    Blood                [A]  Trace-intact                Negative    pH                        7.0                         5.0-8.0    Protein, urine       [A]  Trace mg/dL                 Negative    Urobilinogen              0.2 mg/dL                   1.6-6.0    Nitrite                   Negative                    Negative    Leukocyte Esterase   [A]  Trace                       Negative    Note: An exclamation mark (!) indicates a result that was not dispersed into   the flowsheet.  Document Creation Date: 05/20/2010 7:13 AM  _______________________________________________________________________    (1) Order result status: Final  Collection or observation date-time: 05/19/2010 13:42  Requested date-time: 05/19/2010 13:42  Receipt date-time: 05/20/2010 06:34  Reported date-time: 05/20/2010 07:12  Referring Physician: AMY MIYOSHI  Ordering Physician: Alva Garnet [facility])  Specimen Source: U&URINE     UACUP&URINALYSIS CONTAINER  Source: [name] Order Number: [id number]  Lab site: [facility]      [address]      [place] [address]  [phone]      -----------------    The following lab values were dispersed to the flowsheet  with no units conversion:      Urobilinogen, 0.2 MG/DL, (F)  expected units: E units/dL    -----------------    The following non-numeric lab results were dispersed to  the flowsheet even though numeric results were expected:      Glucose, Urine, Negative

## 2010-05-19 NOTE — Unmapped (Signed)
Signed by Nicholes Mango MD on 06/27/2010 at 20:22:10    PRENATAL VISIT      Reason for Visit   Chief Complaint: Return OB Visit  History from: patient    Allergies  No Known Allergies    Medications  PRENATAL VITAMINS  TABS (PRENATAL MV & MIN W/FE-FA TABS)   AMOXICILLIN-POT CLAVULANATE 875-125 MG TABS (AMOXICILLIN-POT CLAVULANATE) Take 1 tab every 12 hours til all medicine is taken antibiotic for infection  PROVENTIL HFA  AERS (ALBUTEROL SULFATE AERS) prn        Vital Signs   Height: 61 in.   Weight: 234.1 lbs.   Pre-Pregnancy Weight: 220 lbs.  Pulse rate: 85   Temperature: 97.8 degrees  F   Blood Pressure   BP #1: 122 / 65mm Hg  Cuff Size: Lg     Pain:     Have you had pain other than everyday aches and pains (e.g., mild headache, back ache, strains) in the past week?   No    Intake recorded by: Hilton Cork MA on May 19, 2010 1:54 PM                   Past Pregnancies:  G:  4 P:  3 A:  0  SAB: 0  EAB:  0  Term Deliveries:  3  Premature Deliveries:   Ectopic pregnancies:    H/O Multi-fetal Delivery:      Preg # Date Early Loss/ Termination GA Wks Lgth of Labor Brth Wt Sex Type of Del Anes Pl of Del PTL Fetal Comp Matrnl Comp Cmnts   1 03/04/02  FT 16HRS 7-4 F c-section epidural [facility] yes/brethine none No progress    2 12/15/03  40  8-6 M c-section spinal [employer] no none none    3 76/6/09  37  6-1 F c-section spinal [address] yes none none    4 current                     Menses History:   [facility] Delivery: [facility]  First day LMP: 09/22/2009  This date is: approximate  Menses monthly: yes  Frequency:  32 days  Menarche: 10  Contraception used at conception: None  Planned pregnancy: no  Pregnancy confirmed by: home pregnancy test  G: 4  P 3:     Term: 3   Preterm: 0   Ab: 0   # Living Children: 3  SAB: 0  EAB: 0  ECT: 0  Past History  Past Medical History (reviewed - no changes required):  Onset of First Menses at Age: 24, Gravida: 4, Para: 3, Miscarriage(s): 0, Abortion(s):  0  Surgical History (reviewed - no changes required):  2-Wisdom teeth removed 2010, Cesarean Section: x3    Family History (reviewed - no changes required): MGM - Heart Dz  passed HTN,aorta valve problem,Altermeizers  MGF - Aneurysim Brain,HTN passed away  Aunt - 1-pSYCH ISSUES  Uncle - 2- have had Open Heart Surgery 2- HTN  Social History (reviewed - no changes required): Preferred Language: English,   Marital Status: single,   Spouse: FOB Everlene Farrier  Children: 3,   Employment Status: homemaker,   Patient Lives at: apartment,   Support System: excellent  Exercise: walking,   Caffeine per Day: <1  Seatbelt Use: 100 % of the time  Alcohol Use: none  Drug Use: none  Tobacco Usage:prior smoker  Sexually Active: Yes  Other Pertinent History:   Pre-Pregnacy Tobacco Use per Day: 0  Pregnancy Tobacco Use per Day: 0  Pre-Pregnacy Alcohol Use per Day: 0  Pregnancy Alcohol Use per Day: 0  Pre-Pregnancy Illicit/Recreational Drug Use per Day: 0  Pregnancy Illicit/Recreational Drug Use per Day: 0  D(Rh) Sensitized: No  Latex Allergy: No  Betadine Allergy: No  Anesthetic Complications No  History of Abnormal Pap: No  Uterine Anomaly: No  History of Infertility: No  DES Exposure: No  ART Treatment: No      Infection History:   Exposed to TB: no   History of Genital Herpes: no   Sexual partner w/hx of oral or genital HSV: no  Rash or Viral Illness since LMP: no  Hepatitis B: no  Vaccinated for Hepatits B: no  Hepatitis C: no  History of Varicella Vaccine: no  History of Varicella Infection: no  History of STD: no  History of PID: no  Do you care for any cats: no    Genetic Testing/Teratology Counseling:   Patient's Age>= 35 Years as of Estimated Date of Delivery: no  Thalassemia Risk: no  Neural Tube Defect: no  Congenital Heart Defect: no   Down Syndrome: no  Tay-Sachs: no  Canavan Disease: no  Familial Dysautonomia: no  Sickle Cell Disease/Trait: no  Hemophilia/Blood Disorders: no  Muscular Dystrophy: no  Cystic Fibrosis:  no  Huntington's Chorea: no  Mental Retardation/Autism no  Tested for Fragile X: no  Other Genetic or Chromosomal: no  Maternal Metabolic Disorder: no  Other birth defects: no  Recurrent Pregnancy Loss/Stillbirth: no  Medications/Drugs/Alcohol since LMP: no  Previous Genetic testing for Cystic Fibrosis: no  Previous Genetic testing father of baby: no    Past Pregnancies:  G:  4 P:  3 A:  0  SAB: 0  EAB:  0  Term Deliveries:  3  Premature Deliveries:   Ectopic pregnancies:    H/O Multi-fetal Delivery:      Preg # Date Early Loss/ Termination GA Wks Lgth of Labor Brth Wt Sex Type of Del Anes Pl of Del PTL Fetal Comp Matrnl Comp Cmnts   1 03/04/02  FT 16HRS 7-4 F c-section epidural [employer] yes/brethine none No progress    2 12/15/03  39  8-6 M c-section spinal [employer] no none none    3 76/6/09  36  6-1 F c-section spinal [address] yes none none    4 current               5                6                7                8                       EDD:   LMP: 09/22/2009    EDD by LMP: 06/29/2010  1st [place] date: 11/25/2009 = 9  weeks, 1  days  EDD by 1st [place]: 06/29/2010  EDD: 06/29/2010 by LMP    Today's EGA in weeks: 34 1/7    Dating Comments:  EDD 06/29/10 per 9 wk sono = lmp  ..................................................................Marland KitchenMaris Berger CNM  November 26, 2009 8:25 AM      History of Present Illness:   Pt is a 24 y/o G4P3003 at 34+1 by 9 week sono  here for ROB.     Symptoms:   Complains of: back pain, heartburn  Denies: change in discharge, fatigue, headache, nausea, vomiting, pelvic pressure, preterm labor symptoms, vaginal bleeding, loss of fluid  Comments: Heartburn well controlled with TUMs  Fetal movement: active baby    Prenatal Exam:     Weight    Pre-Pregnancy Weight: 220 lbs.    Exam   Est. Gestational Age: 30 1/7  Fundal height (cm): 34cm  FHR: 120's    List of Diagnoses/Issues:   - h/o C/S X 3  - BMI 41 (early and scheduled GCT normal), declines SSI study   - desires permanent  sterlization; signed tubal papers 03/16/10  - Needs anesthesia consult      Fetal Well  Being:   Start antenatal fetal surveillance at 32 weeks.   Serial ultrasounds for fetal growth    Plan of Care:   BTL  Repeat C/S at 39 weeks        Initial Labs:   Blood Type: O (11/25/2009 1:40:00 PM)  D (Rh) Type: Positive (11/25/2009 1:40:00 PM)  Antibody Screen: Negative (11/25/2009 1:42:00 PM)  Hemoglobin: 13.2 (11/25/2009 12:28:00 PM)  Hematocrit: 38.6 (11/25/2009 12:28:00 PM)  MCV: 82.2 (11/25/2009 12:28:00 PM)  Pap Smear: NIL 11/25/09  Rubella: IMMUNE (11/25/2009 5:54:00 PM)  RPR: Non Reactive (11/26/2009 4:11:00 AM)  Urine Culture: Positive (E.Coli)  HBsAg: NON-REACTIVE (11/25/2009 5:54:00 PM)  HIV: Negative (11/25/2009 1:31:00 PM)  Hgb Electrophoresis Phenotype: Normal  Hemoglobin F:  0.0 (11/26/2009 3:15:00 PM)  Hemoglobin A2:  2.2 (11/26/2009 3:15:00 PM)  Hemoglobin C:  0.0 (11/26/2009 3:15:00 PM)  Hemoglobin S:  0.0 (11/26/2009 3:15:00 PM)  Chlamydia: negative 11/25/09  Gonorrhea: negative 11/25/09  Initial Lab Comments: 11/25/09: 1h GCT 137    8-20 Week Labs:   1st Trimester Aneuploidy Risk Assessment  Down's Risk 1:  >10000  Trisomy 13/18 1: >10000  PAPP-A MoM:  .51  Free beta hCG MoM:  .64    24-28 Week Labs:   Hemoglobin: 13.2 (11/25/2009 12:28:00 PM)  Hematocrit: 38.6 (11/25/2009 12:28:00 PM)  MCV: 82.2 (11/25/2009 12:28:00 PM)  1 Hr Glucose Challenge Test: 128 (02/12/2010 12:13:00 PM)        Assessment   24 Years Old Female      G: 4  P 3:     Term: 3   Preterm: 0   Ab: 0   # Living Children: 3  SAB: 0  EAB: 0  ECT: 0  GA: 34 1/7 weeks       EDD: 06/29/2010    Status of Existing Problems  Assessed OBESITY NOS as comment only - ANFS- BPP weekly.  - Mylan Schwarz Dorita Sciara MD - Signed  Assessed PREVIOUS C-SECT DELIVERY X3 as comment only - RLTC and BTL at 39 weeks. Anesthesia consult ordered today.  - Kolbee Stallman Dorita Sciara MD - Signed  Assessed PREGNANCY, MULTIGRAVIDA as comment only - Pt is a 24 y/o G4P3003 at 34+1 by 9 week  sono here for ROB. S=D, +FHT. No labs today. RTC in 2 weeks. 3rd Trim labs at next visit. PTL precautions discussed.  - Nnamdi Dacus Dorita Sciara MD - Signed    TODAY'S REFERRALS   Anesthesia Consult [[id number]]    Patient Instructions   Please come to L&D triage if contractions every 5 minutes, gush of fluid, decreased fetal movement, or vaginal bleeding.   Kick Counts as discussed.     Disposition:   Clinic: Resident COC  Return to clinic for Provider  Visit in 2 Appointment Reason: ROB    *Patient Care Summary printed and given to patient.      Nature conservation officer   I discussed with resident and agree with resident's findings and plan as documented in resident's note.    HPI: iup at 34 weeks - no complaints today   A&P prev CDx 3- desires repeat with BTL - preg alos compicated by morbid obesity - in ANFS - anesthesia consult.      Preceptor: Donne Hazel MD on June 26, 2010 2:05 PM    Delivery Plan:     Delivery:   Permanent Sterilization Consent Signed on: 03/16/2010  Sterilization Consent Valid on: 04/16/2010    Patient Education   1st Trimester Education:  HIV/Prenatal tests, prenatal history risk factors, course of prenatal care, exercise/nutrition/wt gain, toxoplasmosis (cat/raw meat), sexual activity, exercise, smoking counseling, environment/work hazards, travel/seatbelts, tobacco/alcohol/drugs, use of medications, indications for ultrasound, domestic violence, childbirth classes, hospital facilities

## 2010-05-21 NOTE — Unmapped (Signed)
Signed by Janetta Hora RN on 05/21/2010 at 14:07:13    PHONE NOTE        New Medications:  PEPCID 20 MG  TABS (FAMOTIDINE) Take 1 twice a day      OBGYN - Telephone Triage:   Patient is: Pregnant      Other:   Pt. called to report did not get Rx for Pepcid as discussed at 05/19/10 visit.  Pt. would like Rx called to [facility] at [phone] if MD prescribes.  If questions, pt. can be reached at [phone].  Please advise...................................................................Marland KitchenCorinne Ports RN  May 21, 2010 11:29 AM        New Medications:  PEPCID 20 MG  TABS (FAMOTIDINE) Take 1 twice a day    Follow-up for Phone Call   Please call in Rx to pharmacy of pts desire.     Additional Follow-up for Phone Call   Script called in to above referenced pharmacy.  Pt notified re: same.  Additional Follow-up by: Janetta Hora RN,  May 21, 2010 2:07 PM

## 2010-05-26 ENCOUNTER — Inpatient Hospital Stay: Attending: Maternal & Fetal Medicine

## 2010-05-26 NOTE — Unmapped (Signed)
Signed by Roxanne Mins MD on 06/01/2010 at 17:10:22  ANTENATAL TESTING RPT       Imported By: Georgianne Fick 05/26/2010 12:47:32    _____________________________________________________________________    External Attachment:    Please see [employer] EMR for this document.

## 2010-05-26 NOTE — Unmapped (Signed)
Signed by Timmie Foerster MD on 05/26/2010 at 11:31:06      Fetal Non-Stress Test   Testing Date: May 26, 2010  OB Indications:   DECREASED FETAL MOVEMENTS UNSPEC AS EPISODE CARE (ICD-655.70)  Today's BP: 113/66  GA: 35 1/7  Comments: edc 06/29/10    Baby 1:   FHR Baseline: 145  Variability: minimal (<= 5 beats per min)  Decelerations episodic: noUterine contractions: no   External fetal monitoring done and physician notified for interpretation.    NST recorded by: Milas Gain RN on May 26, 2010 10:28 AM  Impression:   NST interpretation is: nonreactive    Plan:    To Labor and Delivery for further evaluation of fetal status  Comments: escorted to labor and delivery- 4/10 BPP  Interpreted by:   Timmie Foerster MD on May 26, 2010 11:29 AM

## 2010-06-02 LAB — CBC
Hematocrit: 33.9 % (ref 35.0–45.0)
Hemoglobin: 11.1 g/dL (ref 11.7–15.5)
MCH: 27.3 pg (ref 27.0–33.0)
MCV: 83 fL (ref 80.0–100.0)
MPV: 9.4 fL (ref 7.5–11.5)
Platelets: 180 10*3/uL (ref 140–400)
RBC: 4.08 10*6/uL (ref 3.80–5.10)
RDW: 14.4 % (ref 11.0–15.0)
WBC: 9.6 10*3/uL (ref 3.8–10.8)

## 2010-06-02 LAB — POC URINALYSIS
Bilirubin Urine: NEGATIVE
Glucose, UA: NEGATIVE g/dL
Ketones, UA: NEGATIVE
Leukocytes, UA: NEGATIVE
Nitrite, UA: NEGATIVE
Protein, UA: NEGATIVE
RBC, UA: NEGATIVE
Specific Gravity, UA: 1.015 (ref 1.005–1.035)
Urobilinogen, UA: 0.2 E units/dL (ref 0.2–1.0)
pH, UA: 7 (ref 5.0–8.0)

## 2010-06-02 LAB — RPR W TITER: RPR: NONREACTIVE

## 2010-06-02 LAB — HIV 1+2 ANTIBODY/ANTIGEN WITH REFLEX: HIV-1/HIV-2 Ab: NEGATIVE

## 2010-06-02 NOTE — Unmapped (Signed)
Signed by Roxanne Mins MD on 06/04/2010 at 09:51:15  ANTENATAL TESTING RPT       Imported By: Georgianne Fick 06/03/2010 12:42:30    _____________________________________________________________________    External Attachment:    Please see [employer] EMR for this document.

## 2010-06-02 NOTE — Unmapped (Signed)
Signed by Erskine Speed MD on 06/02/2010 at 17:25:34  Patient: Diane Stafford  Note: All result statuses are Final unless otherwise noted.    Tests: (1) HEPATITIS B SURFACE ANTIGEN (HBSAG)  ! HBsAg                     Negative                    Negative      Effective 04/19/2010, Hepatitis B Surface Antigen screen and confirmation   will be performed in house on [employer] platform using Immunometric   methodology.    Note: An exclamation mark (!) indicates a result that was not dispersed into   the flowsheet.  Document Creation Date: 06/02/2010 5:03 PM  _______________________________________________________________________    (1) Order result status: Final  Collection or observation date-time: 06/02/2010 14:20  Requested date-time: 06/02/2010 14:20  Receipt date-time: 06/02/2010 15:21  Reported date-time: 06/02/2010 17:03  Referring Physician: AMY MIYOSHI  Ordering Physician: Orland Penman [facility])  Specimen Source: S&SERUM     SST UNOPENED&SST Send entire unopened tube  Source: [name] Order Number: [id number]  Lab site: [facility]      [address]      [place] [address]  [phone]

## 2010-06-02 NOTE — Unmapped (Signed)
Signed by Erskine Speed MD on 06/08/2010 at 15:24:12  Patient: Diane Stafford  Note: All result statuses are Final unless otherwise noted.    Tests: (1) RPR w FTA reflex (RPR)    Order Note: Performed at:  [facility]    Order Note: [address], [place], [place]  [id number]    Order Note: Lab Director: Jacki Cones MD, Phone:  [phone]    RPR w reflex              Non Reactive                NonReactive    Note: An exclamation mark (!) indicates a result that was not dispersed into   the flowsheet.  Document Creation Date: 06/03/2010 5:10 AM  _______________________________________________________________________    (1) Order result status: Final  Collection or observation date-time: 06/02/2010 14:20  Requested date-time: 06/02/2010 14:20  Receipt date-time: 06/02/2010 21:44  Reported date-time: 06/03/2010 05:09  Referring Physician: AMY MIYOSHI  Ordering Physician: Orland Penman [facility])  Specimen Source: S&SERUM     SST P&SST (2 ml S Rm Tmp)  Source: [name] Order Number: [id number]  Lab site: [facility]      [address]      [place] [address]  [phone]

## 2010-06-02 NOTE — Unmapped (Signed)
Signed by Erskine Speed MD on 06/08/2010 at 15:24:12  Patient: Diane Stafford  Note: All result statuses are Final unless otherwise noted.    Tests: (1) CHLAMYDIA / GONORRHOEAE DNA SWAB (CTNGSW)  ! Chlamydia Trachomatis DNA                              Negative                    Negative      Chlamydia trachomatis Roche PCR sensitivity is 93.0%.  Specificity is   96.5%.  The test methology for CT/NG is a FDA approved DNA amplification   Assay.  ! Neisseria Gonorrhoeae DNA                              Negative                    Negative      Neisseria gonorrhoeae Roche PCR sensitivity is 97.1%.  Specificity is   98.1%.    Note: An exclamation mark (!) indicates a result that was not dispersed into   the flowsheet.  Document Creation Date: 06/03/2010 3:34 PM  _______________________________________________________________________    (1) Order result status: Final  Collection or observation date-time: 06/02/2010 13:41  Requested date-time: 06/02/2010 13:41  Receipt date-time: 06/02/2010 18:48  Reported date-time: 06/03/2010 15:33  Referring Physician: AMY MIYOSHI  Ordering Physician: Orland Penman [facility])  Specimen Source: SB&SWAB     SWABM4T&SWAB-M4RT [employer]  Source: [name] Order Number: [id number]  Lab site: [facility]      [address]      [place] [address]  [phone]

## 2010-06-02 NOTE — Unmapped (Signed)
Signed by Erskine Speed MD on 06/02/2010 at 17:25:34  Patient: Diane Stafford  Note: All result statuses are Final unless otherwise noted.    Tests: (1) HIVR (HIV1AND2) (HIVR)  ! HIV 1/O/2 Abs-Index       <1.00 INDEX                 <1.00    HIV 1/O/2 Abs, Qual       Negative                    Negative    Note: An exclamation mark (!) indicates a result that was not dispersed into   the flowsheet.  Document Creation Date: 06/02/2010 5:03 PM  _______________________________________________________________________    (1) Order result status: Final  Collection or observation date-time: 06/02/2010 14:20  Requested date-time: 06/02/2010 14:20  Receipt date-time: 06/02/2010 15:21  Reported date-time: 06/02/2010 17:03  Referring Physician: AMY MIYOSHI  Ordering Physician: Orland Penman [facility])  Specimen Source: S&SERUM     SST UNOPEN&SST unopened (4 ml S Refrig)  Source: [name] Order Number: [id number]  Lab site: [facility]      [address]      [place] [address]  [phone]

## 2010-06-02 NOTE — Unmapped (Signed)
Signed by Kirstie Mirza RN on 06/02/2010 at 14:01:30    Printed Handout:  - PRE-PROCEDURE INSTRUCTIONS

## 2010-06-02 NOTE — Unmapped (Signed)
Signed by M Faye  McEwen RN on 06/02/2010 at 14:01:30    Printed Handout:  - PRE-PROCEDURE INSTRUCTIONS

## 2010-06-02 NOTE — Unmapped (Signed)
Signed by Erskine Speed MD on 06/02/2010 at 13:57:02  Patient: Diane Stafford  Note: All result statuses are Final unless otherwise noted.    Tests: (1) POC URINALYSIS (POCUA)    Glucose, Urine            Negative mg/dL              Negative    Bilirubin, Urine          Negative                    Negative    Ketone                    Negative mg/dL              Negative    Specific Gravity          1.015                       1.005-1.035    Blood                     Negative                    Negative    pH                        7.0                         5.0-8.0    Protein, urine            Negative mg/dL              Negative    Urobilinogen              0.2 mg/dL                   9.6-2.9    Nitrite                   Negative                    Negative    Leukocyte Esterase        Negative                    Negative    Note: An exclamation mark (!) indicates a result that was not dispersed into   the flowsheet.  Document Creation Date: 06/02/2010 1:24 PM  _______________________________________________________________________    (1) Order result status: Final  Collection or observation date-time: 06/02/2010 13:20  Requested date-time: 06/02/2010 13:20  Receipt date-time: 06/02/2010 12:47  Reported date-time: 06/02/2010 13:24  Referring Physician: AMY MIYOSHI  Ordering Physician: Alva Garnet [facility])  Specimen Source: U&URINE     UACUP&URINALYSIS CONTAINER  Source: [name] Order Number: [id number]  Lab site: [facility]      [address]      [place] [address]  [phone]      -----------------    The following lab values were dispersed to the flowsheet  with no units conversion:      Urobilinogen, 0.2 MG/DL, (F)  expected units: E units/dL    -----------------    The following non-numeric lab results were dispersed to  the flowsheet even though numeric results were expected:  Glucose, Urine, Negative

## 2010-06-02 NOTE — Unmapped (Signed)
Signed by Erskine Speed MD on 06/02/2010 at 16:17:46  Patient: Diane Stafford  Note: All result statuses are Final unless otherwise noted.    Tests: (1) CBC (CBC)    WBC                       9.6 10E3/uL                 3.8-10.8    RBC                       4.08 10E6/uL                3.80-5.10    Hgb                  [L]  11.1 g/dL                   38.7-56.4    HCT                  [L]  33.9 %                      35.0-45.0    MCV                       83.0 fL                     80.0-100.0    MCH                       27.3 pg                     27.0-33.0    MCHC                      32.9 g/dL                   33.2-95.1    RDW                       14.4 %                      11.0-15.0    Platelet count            180 10E3/uL                 140-400    MPV                       9.4 fL                      7.5-11.5    Note: An exclamation mark (!) indicates a result that was not dispersed into   the flowsheet.  Document Creation Date: 06/02/2010 3:36 PM  _______________________________________________________________________    (1) Order result status: Final  Collection or observation date-time: 06/02/2010 14:20  Requested date-time: 06/02/2010 14:20  Receipt date-time: 06/02/2010 15:21  Reported date-time: 06/02/2010 15:36  Referring Physician: AMY MIYOSHI  Ordering Physician: Orland Penman [facility])  Specimen Source: WB&WHOLE BLOOD     LAV K&LAV (3 ml WB Rm Tmp)  Source: [name] Order Number: [id number]  Lab site: [facility]      [address]  [place] [address]  [phone]      -----------------    The following lab values were dispersed to the flowsheet  with no units conversion:      WBC, 9.6 10E3/UL, (F)  expected units: 10*3/mm3    RBC, 4.08 10E6/UL, (F)  expected units: 10*6/mm3    Platelet count, 180 10E3/UL, (F)  expected units: 10*3/mm3    -----------------    The following results were not dispersed to the flowsheet  because of errors during the import  process:      MCHC, 32.9 g/dL, (F)

## 2010-06-02 NOTE — Unmapped (Signed)
Signed by Erskine Speed MD on 06/02/2010 at 00:00:00  PRE-PROCEDURE INSTRUCTIONS       Imported By: Georgianne Fick 06/02/2010 16:58:51    _____________________________________________________________________    External Attachment:    Please see [employer] EMR for this document.

## 2010-06-02 NOTE — Unmapped (Signed)
Signed by Erskine Speed MD on 06/08/2010 at 15:24:12  Patient: Jaymes Graff  Note: All result statuses are Final unless otherwise noted.    Tests: (1) STREP B DIRECT PROBE (SBDP)  ! Clinical Report           FINAL Report      Specimen/Source: GENITAL SWAB/VAGINA  Collected: 06/02/2010 13:41     Status: Final      Last Updated: 06/08/2010 12:30           (1) Normal Range: None Detected             SBDP (Final)      Positive for Group B Streptococcus by direct probe      Organism Failed To Thrive For Susceptibility Testing No Further Workup      Performing Site: [facility] [address] [place] [address].      LCA [id number] #:[id number].          Note: An exclamation mark (!) indicates a result that was not dispersed into   the flowsheet.  Document Creation Date: 06/08/2010 12:31 PM  _______________________________________________________________________    (1) Order result status: Final  Collection or observation date-time: 06/02/2010 13:41  Requested date-time: 06/02/2010 13:41  Receipt date-time: 06/03/2010 01:31  Reported date-time: 06/08/2010 12:30  Referring Physician: AMY MIYOSHI  Ordering Physician: Orland Penman [facility])  Specimen Source: GS&GENITAL SWAB   VAG&VAGINA  SWABB&Swab, Blue (with gel)  Source: [name] Order Number: [id number]  Lab site: [facility]      [address]      [place] [address]  [phone]

## 2010-06-02 NOTE — Unmapped (Signed)
Signed by Erskine Speed MD on 06/02/2010 at 15:00:47    PRENATAL VISIT      Reason for Visit   Chief Complaint: Return OB Visit  History from: patient    Allergies  No Known Allergies    Medications  PRENATAL VITAMINS  TABS (PRENATAL MV & MIN W/FE-FA TABS)   AMOXICILLIN-POT CLAVULANATE 875-125 MG TABS (AMOXICILLIN-POT CLAVULANATE) Take 1 tab every 12 hours til all medicine is taken antibiotic for infection  PROVENTIL HFA  AERS (ALBUTEROL SULFATE AERS) prn  PEPCID 20 MG  TABS (FAMOTIDINE) Take 1 twice a day        Vital Signs   Height: 61 in.   Weight: 234.6 lbs.   Pre-Pregnancy Weight: 220 lbs.    Wt change since last visit: 3.30 lbs      Total Preg Wt Change: 14.60 lbs   BMI (in-lb): 44.49   BSA (m2): 2.02  Pulse rate: 90   Temperature: 97.8 degrees  F   Blood Pressure   BP #1: 128 / 70mm Hg  Cuff Size: Std     Pain:     Have you had pain other than everyday aches and pains (e.g., mild headache, back ache, strains) in the past week?   No    Intake recorded by: Collier Salina MA on June 02, 2010 12:48 PM                   Menses History:   [facility]  First day LMP: 09/22/2009  This date is: approximate  Menses monthly: yes  Frequency:  32 days  Menarche: 10  Contraception used at conception: None  Planned pregnancy: no  Pregnancy confirmed by: home pregnancy test  G: 4  P 3:     Term: 3   Preterm: 0   Ab: 0   # Living Children: 3  SAB: 0  EAB: 0  ECT: 0    Other Pertinent History:   Pre-Pregnacy Tobacco Use per Day: 0  Pregnancy Tobacco Use per Day: 0  Pre-Pregnacy Alcohol Use per Day: 0  Pregnancy Alcohol Use per Day: 0  Pre-Pregnancy Illicit/Recreational Drug Use per Day: 0  Pregnancy Illicit/Recreational Drug Use per Day: 0  D(Rh) Sensitized: No  Latex Allergy: No  Betadine Allergy: No  Anesthetic Complications No  History of Abnormal Pap: No  Uterine Anomaly: No  History of Infertility: No  DES Exposure: No  ART Treatment: No      Infection History:   Exposed to TB: no      History of Genital Herpes: no   Sexual partner w/hx of oral or genital HSV: no  Rash or Viral Illness since LMP: no  Hepatitis B: no  Vaccinated for Hepatits B: no  Hepatitis C: no  History of Varicella Vaccine: no  History of Varicella Infection: no  History of STD: no  History of PID: no  Do you care for any cats: no    Genetic Testing/Teratology Counseling:   Patient's Age>= 35 Years as of Estimated Date of Delivery: no  Thalassemia Risk: no  Neural Tube Defect: no  Congenital Heart Defect: no   Down Syndrome: no  Tay-Sachs: no  Canavan Disease: no  Familial Dysautonomia: no  Sickle Cell Disease/Trait: no  Hemophilia/Blood Disorders: no  Muscular Dystrophy: no  Cystic Fibrosis: no  Huntington's Chorea: no  Mental Retardation/Autism no  Tested for Fragile X: no  Other Genetic or Chromosomal: no  Maternal Metabolic Disorder: no  Other birth defects: no  Recurrent Pregnancy Loss/Stillbirth: no  Medications/Drugs/Alcohol since LMP: no  Previous Genetic testing for Cystic Fibrosis: no  Previous Genetic testing father of baby: no    EDD:   LMP: 09/22/2009    EDD by LMP: 06/29/2010  1st [place] date: 11/25/2009 = 9  weeks, 1  days  EDD by 1st [place]: 06/29/2010  EDD: 06/29/2010 by LMP    Today's EGA in weeks: 36 1/7    Dating Comments:  EDD 06/29/10 per 9 wk sono = lmp  ..................................................................Marland KitchenMaris Berger CNM  November 26, 2009 8:25 AM      History of Present Illness:   Patient is a 24 year old female who is [redacted] weeks pregnant.  She complains of hip pain that has been present throughout her pregnancy.  The pain is constant but gets worse when she moves around.  She also complains of swelling, mostly in her ankles, but some in her hands as well.    Symptoms:   Complains of: fatigue, headache, heartburn, uterine activity  Denies: change in discharge, constipation, nausea, vomiting, swelling, vaginal bleeding, loss of fluid    Prenatal Exam:     Weight    Pre-Pregnancy Weight: 220  lbs.  Total weight change: 14.60 lbs.  Weight change since last visit: 3.30 lbs.    Exam   Est. Gestational Age: 22 1/7  Fundal height (cm): 39  FHR: 140's  Cervical dilation: cl  Cervical effacement: thick  Station: high    List of Diagnoses/Issues:   - h/o C/S X 3  - BMI 41 (early and scheduled GCT normal), declines SSI study   - desires permanent sterlization; signed tubal papers 03/16/10  - Needs anesthesia consult      Fetal Well  Being:   Start antenatal fetal surveillance at 32 weeks.   Serial ultrasounds for fetal growth    Plan of Care:   BTL  Repeat C/S at 39 weeks  Flu vaccine on 10/26        Initial Labs:   Blood Type: O (11/25/2009 1:40:00 PM)  D (Rh) Type: Positive (11/25/2009 1:40:00 PM)  Antibody Screen: Negative (11/25/2009 1:42:00 PM)  Hemoglobin: 13.2 (11/25/2009 12:28:00 PM)  Hematocrit: 38.6 (11/25/2009 12:28:00 PM)  MCV: 82.2 (11/25/2009 12:28:00 PM)  Pap Smear: NIL 11/25/09  Rubella: IMMUNE (11/25/2009 5:54:00 PM)  RPR: Non Reactive (11/26/2009 4:11:00 AM)  Urine Culture: Positive (E.Coli)  HBsAg: NON-REACTIVE (11/25/2009 5:54:00 PM)  HIV: Negative (11/25/2009 1:31:00 PM)  Hgb Electrophoresis Phenotype: Normal  Hemoglobin F:  0.0 (11/26/2009 3:15:00 PM)  Hemoglobin A2:  2.2 (11/26/2009 3:15:00 PM)  Hemoglobin C:  0.0 (11/26/2009 3:15:00 PM)  Hemoglobin S:  0.0 (11/26/2009 3:15:00 PM)  Chlamydia: negative 11/25/09  Gonorrhea: negative 11/25/09  Initial Lab Comments: 11/25/09: 1h GCT 137    8-20 Week Labs:   1st Trimester Aneuploidy Risk Assessment  Down's Risk 1:  >10000  Trisomy 13/18 1: >10000  PAPP-A MoM:  .51  Free beta hCG MoM:  .64    24-28 Week Labs:   Hemoglobin: 13.2 (11/25/2009 12:28:00 PM)  Hematocrit: 38.6 (11/25/2009 12:28:00 PM)  MCV: 82.2 (11/25/2009 12:28:00 PM)  1 Hr Glucose Challenge Test: 128 (02/12/2010 12:13:00 PM)        Assessment   24 Years Old Female      G: 4  P 3:     Term: 3   Preterm: 0   Ab: 0   # Living Children: 3  SAB: 0  EAB: 0  ECT: 0  GA: 36 1/7 weeks       EDD:  06/29/2010    Status of Existing Problems  Assessed OBESITY NOS as comment only - Needs anesthesia consult.  - Erskine Speed MD - Signed  Assessed PREVIOUS C-SECT DELIVERY X3 as comment only - For repeat c-section with BTL papers signed 03/16/10 - Erskine Speed MD - Signed  Assessed PREGNANCY, MULTIGRAVIDA as comment only - RTO in 1 week. Third trimester labs today.  Erskine Speed MD - Signed    Today's Orders   DNA Probe GC & Chlamydia   (DNAP)  (17305) [*CPT-87491/87591]  Strep B with DNA Probe - Vag or Anorectal  (SBDP) (14547) [CPT-87149]  CBC w/o Differential (1759) [*CPT-85027]  HIV Antibody    (HIVR) (16109) [CPT-86689]  RPR qualitative w reflex  (60454)  [CPT-80055]  Hepatitis B Surface Antigen   (HBSAG) (498)  [CPT-86287]    TODAY'S REFERRALS   Anesthesia Consult [UCP-11111]    Patient Instructions   come to triage for painful contractions every 5 minutes, if your water breaks, decreased fetal movement or vaginal bleeding.  Your C-section is scheduled for 06/22/10 @ 8:00   . Arrive at  6:00 am   and do not eat or drink anything after midnight.  Shower the night before and morning of with hibiclens.     Disposition:   Clinic: Resident COC  Primary OB Provider: Micael Hampshire  Return to clinic for Provider Visit in 1 week(s)     *Patient Care Summary printed and given to patient.      Nature conservation officer   I discussed with resident and agree with resident's findings and plan as documented in resident's note.    Comments: 24yo at 36 weeks with previous c/s and desire BTL, plan repeat C/S ant BTL at 69 weeks    Preceptor: Leavy Cella MD on June 02, 2010 2:12 PM    Delivery Plan:     Delivery:   Permanent Sterilization Consent Signed on: 03/16/2010  Sterilization Consent Valid on: 04/16/2010    Patient Education   1st Trimester Education:  HIV/Prenatal tests, prenatal history risk factors, course of prenatal care, exercise/nutrition/wt gain, toxoplasmosis (cat/raw meat), sexual activity,  exercise, smoking counseling, environment/work hazards, travel/seatbelts, tobacco/alcohol/drugs, use of medications, indications for ultrasound, domestic violence, childbirth classes, hospital facilities                  Influenza Vaccine     Vaccine Type: Fluzone     Date/Time: 06/02/2010 12:47 PM     Site: left deltoid     Mfr: [employer]     Dose: 0.5 ml     Route: IM     Given by: Kirstie Mirza RN     Exp. Date: 02/05/2011     Lot #: [id number]     VIS given: 06/02/2010    Flu Vaccine Consent Questions      Do you have a history of severe allergic reactions to this vaccine? no     Any prior history of allergic reactions to egg and/or gelatin? no     Do you have a sensitivity to the preservative Thimersol? no     Do you have a past history of Guillan-Barre Syndrome? no     Do you currently have an acute febrile illness? no     Have you ever had a severe reaction to latex? no     Vaccine information given and explained to  patient? yes     Are you currently pregnant? yes        Influenza Vaccine     Vaccine Type: Fluzone     Date/Time: 06/02/2010 12:47 PM     Site: left deltoid     Mfr: [employer]     Dose: 0.5 ml     Route: IM     Given by: Kirstie Mirza RN     Exp. Date: 02/05/2011     Lot #: [id number]     VIS given: 06/02/2010    Flu Vaccine Consent Questions      Do you have a history of severe allergic reactions to this vaccine? no     Any prior history of allergic reactions to egg and/or gelatin? no     Do you have a sensitivity to the preservative Thimersol? no     Do you have a past history of Guillan-Barre Syndrome? no     Do you currently have an acute febrile illness? no     Have you ever had a severe reaction to latex? no     Vaccine information given and explained to patient? yes     Are you currently pregnant? yes        RN Exit  RTC 1 week  Fetal movement  Danger/Labor signs  Emergency contact numbers given  Location of L & D  plan reviewed and questions answered  Anesthesia  consult scheduled on Nov. 2nd 11:00am, flu vaccine given without adverse reaction. ACOG faxed to [phone].      ...................................................................Kirstie Mirza RN  June 02, 2010 2:57 PM    Patient given pain phamplet, guide to surgery and      prevention of post-op infection phamplets.  She was given Betasept and instructions for use.  Patient educated on bowel prep and is to be NPO after midnite the day before her surgery.  Lavell Anchors RN  June 02, 2010 2:58 PM

## 2010-06-09 ENCOUNTER — Inpatient Hospital Stay

## 2010-06-09 NOTE — Unmapped (Signed)
Signed by Roxanne Mins MD on 06/11/2010 at 13:00:49  OB US      Imported By: Georgianne Fick 06/10/2010 09:19:52    _____________________________________________________________________    External Attachment:    Please see [facility] EMR for this document.

## 2010-06-10 NOTE — Unmapped (Signed)
Signed by Corinne Ports RN on 06/10/2010 at 13:55:29    PHONE NOTE          OBGYN - Telephone Triage:   Patient is: Pregnant      Other:   Pt. called to report having difficulty obtaining ROB appt. which is compatible with her transportation and child care issues and is concerned that [facility] is not making accomodations for her.  Pt. also reports went to triage yesterday and feels like she was not monitored for a long enough period of time and believes she is not getting the care she requires because of her Medicaid status.  This Clinical research associate spoke to pt. for a considerable length of time during which it was emphasized that decisions are not made regarding appts. or care based on insurance status.    A call was then placed directly to the scheduling center and pt. was given four dates as options to schedule an appt. but pt. declined all of them.  Pt. has BPP appt. on 06/16/10 and was encouraged to keep that appt.  Labor precautions were reviewed..................................................................Marland KitchenCorinne Ports RN  June 10, 2010 1:51 PM

## 2010-06-10 NOTE — Unmapped (Signed)
Signed by   LinkLogic on 06/11/2010 at 02:36:14  Patient: Diane Stafford  Note: All result statuses are Final unless otherwise noted.    Tests: (1) AdmissionInformation (AI)    Account Number            [id number] (R)    Note: An exclamation mark (!) indicates a result that was not dispersed into   the flowsheet.  Document Creation Date: 06/11/2010 2:36 AM  _______________________________________________________________________    (1) Order result status: Final  Collection or observation date-time: 06/10/2010 22:33  Requested date-time:   Receipt date-time:   Reported date-time:   Referring Physician: [name]  Ordering Physician:  [facility]ed In [facility])  Specimen Source:   Source: DBS  Filler Order Number:   Lab site: [facility]

## 2010-06-11 NOTE — Unmapped (Signed)
Signed by   LinkLogic on 06/11/2010 at 08:57:06  Patient: Diane Stafford  Note: All result statuses are Final unless otherwise noted.    Tests: (1)  (MR)    Order Note:                                         [facility]     PATIENT NAME:   Diane, Stafford                  MR #:  [id number]  DATE OF BIRTH:  Oct 29, 1985                        ACCOUNT #:  [id number]  SURGEON:        [name], M.D.                ROOM #:  [id number]  SERVICE:        OB/GYN                            NURSING UNIT:  [facility]  PRIMARY:        [name]. [name], M.D.              [name]:  REFERRINGAmmie Ferrier, D.O.              ADMIT DATE:  06/10/2010  DICTATED BY:    Ellard Artis, M.D.              SURGERY DATE:  06/11/2010                                                    DISCHARGE DATE:                                    OPERATIVE REPORT     *-*-*     PREOPERATIVE DIAGNOSES:     1.  Intrauterine pregnancy at 37+[redacted] weeks gestation by 9-week ultrasound.  2.  History of prior cesarean section x3.  3.  Morbid obesity with a body mass index of 41.  4.  Desires permanent sterilization.  5.  Group B streptococcus positive.  6.  Regular uterine contractions with cervical change.     POSTOPERATIVE DIAGNOSES:     1.  Intrauterine pregnancy at 37+[redacted] weeks gestation by 9-week ultrasound.  2.  History of prior cesarean section x3.  3.  Morbid obesity with a body mass index of 41.  4.  Desires permanent sterilization.  5.  Group B streptococcus positive.  6.  Regular uterine contractions with cervical change.     PROCEDURE PERFORMED:     1.  Repeat low transverse cesarean section via Pfannenstiel incision.  2.  Bilateral tubal ligation using the [place] method.     SURGEON:  [name], M.D.     ASSISTANTS:  Ellard Artis, M.D. and Nelda Marseille, M.D.     ANESTHESIA:  Combined spinal epidural anesthesia with Duramorph.     COMPLICATIONS:  None.     ESTIMATED BLOOD LOSS:  800 mL.  FLUIDS GIVEN:  2300 mL of crystalloid.     URINE OUTPUT:   250 mL of clear yellow urine at the end of the procedure.     INDICATIONS FOR PROCEDURE:  The patient was a 24 year old G4, P3-0-0-3 at  37+[redacted] weeks gestation by 9-week ultrasound who presented with a history of  three prior cesarean sections and contractions every two to four minutes and  changed her cervix from 0 cm to 1 cm of the course of two hours.  Thus, it  was decided to proceed with a repeat cesarean section and tubal ligation at  term.     FINDINGS:  A female infant in the cephalic presentation.  Clear amniotic  fluid.  Nuchal cord x1.  Apgars were 9 and 9 at one and five minutes  respectively.  Weight was 2990 grams.  The patient had normal uterus, tubes  and ovaries bilaterally.  There was only a mild to moderate amount of  adhesions present in the patient's abdomen.     DETAILS OF PROCEDURE:  The  patient was taken to the operating room where  combined spinal epidural anesthesia was given with Duramorph and found to be  adequate.  She was then prepped and draped in normal sterile fashion in the  dorsal supine position with a leftward tilt.  The timeout was then performed.  The patient verified that she wanted both repeat cesarean section and tubal  ligation.  A Pfannenstiel incision was then made with a scalpel and carried  down to the underlying layer of fascia with the scalpel.  The fascia was  incised in the midline and the incision was extended laterally with the  curved Mayo scissors.  The superior aspect of the fascial incision was  grasped with Kocher clamps, elevated, and the underlying rectus muscles  dissected off sharply with the scalpel.  Attention was then turned to the  inferior aspect of the incision, which in a similar fashion was grasped,  tented up with the Kocher clamps and the rectus muscles dissected off both  bluntly and sharply.  Rectus muscles were then separated in the midline and  the peritoneum was identified and entered sharply with the scalpel.  The  peritoneal incision was  then extended superiorly and inferiorly with good  visualization of the bladder.  The bladder blade was then inserted and the  vesicouterine peritoneum was identified, grasped with the smooth pickups and  entered sharply with the Metzenbaum scissors.  This incision was then  extended laterally with the Metzenbaum scissors.  The bladder blade was then  reinserted to include the bladder flap and the lower uterine segment was  incised in a transverse U-shaped fashion with the scalpel.  Uterine incision  was then extended laterally with gentle stretching.  Bladder blade was then  removed and the infant's head was delivered atraumatically.  The nose and  mouth were then bulb suctioned and the cord was clamped twice and cut.  The  infant was handed off to the awaiting nursing staff.  Cord gases were  collected  as well as cord blood was collected.  The placenta was then  removed using gentle traction on the umbilical cord while fundal massage was  applied.  The placenta delivered spontaneously and intact.  The uterus was  then exteriorized and cleared of all clots and debris.  The uterine incision  was repaired with an 0 Vicryl stitch in a running locked fashion.  This layer  was hemostatic with the exception  of a small area in the midline in which a 0  Vicryl stitch was used to place a figure-of-eight and achieve complete  hemostasis.  Given that the patient was getting her tubal ligation, it was  not necessary to place a second layer given that the first layer was  hemostatic.  Attention was then turned to the patient's tubes where her right  tube was identified and followed out to the fimbriated end.  A segment of  tube was grasped approximately 3 cm from the cornual region and was grasped  with Babcock clamp.  A small hole was made in the mesosalpinx below this tube  and a portion of the tube was ligated and a 2 cm segment of tube was  resected.  This segment was sent to pathology.  Both ends of the tube were  noted  to be hemostatic and the tube was returned to the abdomen.  Attention  was then turned to the patient's left tube, which in a similar fashion was  grasped approximately 3 cm from the cornual region with the Babcock clamp and  the tube was resected in a similar fashion as the patient's right tube.  Again after the resection of a 2-3 cm segment of tube, both proximal and  distal ends were noted to be hemostatic and the tube was returned to the  abdomen.  Uterus was then returned to the abdomen and the lower uterine  incision was inspected one more time and again noted to be hemostatic.  Prior  to closure of the fascia, the tubes were inspected bilaterally within the  abdomen and noted to be hemostatic.  The bladder flap was left open and the  peritoneum was left open.  The gutters were then cleared of all clots and  debris and the fascia was reapproximated with an 0 PDS stitch in a running  fashion.  The subcuticular layer was then irrigated and Bovie was used where  necessary to achieve hemostasis.  The subcuticular layer measured 3 cm in  depth and thus was reapproximated with 3-0 Vicryl stitch in a running  fashion.  The skin was closed with 4-0 subcuticular Monocryl stitch.  Overall, the patient tolerated the procedure well.  Sponge, lap and needle  counts were correct x2.  Two grams of Ancef was given prior to the procedure  and the patient was taken to the recovery room in stable condition.  Again,  the estimated blood loss was 800 mL and complications were none.        *-*-*                                              _______________________________________  DM/kd                                  _____  D:  06/11/2010 04:34                  [name], M.D.  T:  06/11/2010 08:53                  Dictated by:  Ellard Artis, M.D.  Job #:  [id number]     c:   Anesthesia  OPERATIVE REPORT                                                               PAGE    1 of   1    Note: An  exclamation mark (!) indicates a result that was not dispersed into   the flowsheet.  Document Creation Date: 06/11/2010 8:57 AM  _______________________________________________________________________    (1) Order result status: Final  Collection or observation date-time: 06/11/2010 00:00  Requested date-time:   Receipt date-time:   Reported date-time:   Referring Physician: [name]  Ordering Physician:  Reviewed In [facility])  Specimen Source:   Source: DBS  Filler Order Number: [id number]  Lab site:

## 2010-06-11 NOTE — Unmapped (Signed)
THE [facility]     PATIENT NAME:   Diane Stafford, Diane Stafford                  MR #:  [id number]   DATE OF BIRTH:  [birth year]                        ACCOUNT #:  [id number]   SURGEON:        [name], M.D.                ROOM #:  [id number]   SERVICE:        OB/GYN                            NURSING UNIT:  [facility]   PRIMARY:        [name]. [name], M.D.              [name]:   REFERRINGAmmie Ferrier, D.O.              ADMIT DATE:  06/10/2010   DICTATED BY:    Ellard Artis, M.D.              SURGERY DATE:  06/11/2010                                                     DISCHARGE DATE:                                    OPERATIVE REPORT     *-*-*     PREOPERATIVE DIAGNOSES:     1.  Intrauterine pregnancy at 37+[redacted] weeks gestation by 9-week ultrasound.   2.  History of prior cesarean section x3.   3.  Morbid obesity with a body mass index of 41.   4.  Desires permanent sterilization.   5.  Group B streptococcus positive.   6.  Regular uterine contractions with cervical change.     POSTOPERATIVE DIAGNOSES:     1.  Intrauterine pregnancy at 37+[redacted] weeks gestation by 9-week ultrasound.   2.  History of prior cesarean section x3.   3.  Morbid obesity with a body mass index of 41.   4.  Desires permanent sterilization.   5.  Group B streptococcus positive.   6.  Regular uterine contractions with cervical change.     PROCEDURE PERFORMED:     1.  Repeat low transverse cesarean section via Pfannenstiel incision.   2.  Bilateral tubal ligation using the [place] method.     SURGEON:  [name], M.D.     ASSISTANTS:  Ellard Artis, M.D. and Nelda Marseille, M.D.     ANESTHESIA:  Combined spinal epidural anesthesia with Duramorph.     COMPLICATIONS:  None.     ESTIMATED BLOOD LOSS:  800 mL.     FLUIDS GIVEN:  2300 mL of crystalloid.     URINE OUTPUT:  250 mL of clear yellow urine at the end of the procedure.     INDICATIONS FOR PROCEDURE:  The patient was a 24 year old G4, P3-0-0-3 at   37+[redacted] weeks gestation by  9-week ultrasound who presented with  a history of   three prior cesarean sections and contractions every two to four minutes and   changed her cervix from 0 cm to 1 cm of the course of two hours.  Thus, it   was decided to proceed with a repeat cesarean section and tubal ligation at   term.     FINDINGS:  A female infant in the cephalic presentation.  Clear amniotic   fluid.  Nuchal cord x1.  Apgars were 9 and 9 at one and five minutes   respectively.  Weight was 2990 grams.  The patient had normal uterus, tubes   and ovaries bilaterally.  There was only a mild to moderate amount of   adhesions present in the patient's abdomen.     DETAILS OF PROCEDURE:  The  patient was taken to the operating room where   combined spinal epidural anesthesia was given with Duramorph and found to be   adequate.  She was then prepped and draped in normal sterile fashion in the   dorsal supine position with a leftward tilt.  The timeout was then performed.   The patient verified that she wanted both repeat cesarean section and tubal   ligation.  A Pfannenstiel incision was then made with a scalpel and carried   down to the underlying layer of fascia with the scalpel.  The fascia was   incised in the midline and the incision was extended laterally with the   curved Mayo scissors.  The superior aspect of the fascial incision was   grasped with Kocher clamps, elevated, and the underlying rectus muscles   dissected off sharply with the scalpel.  Attention was then turned to the   inferior aspect of the incision, which in a similar fashion was grasped,   tented up with the Kocher clamps and the rectus muscles dissected off both   bluntly and sharply.  Rectus muscles were then separated in the midline and   the peritoneum was identified and entered sharply with the scalpel.  The   peritoneal incision was then extended superiorly and inferiorly with good   visualization of the bladder.  The bladder blade was then inserted and the    vesicouterine peritoneum was identified, grasped with the smooth pickups and   entered sharply with the Metzenbaum scissors.  This incision was then   extended laterally with the Metzenbaum scissors.  The bladder blade was then   reinserted to include the bladder flap and the lower uterine segment was   incised in a transverse U-shaped fashion with the scalpel.  Uterine incision   was then extended laterally with gentle stretching.  Bladder blade was then   removed and the infant's head was delivered atraumatically.  The nose and   mouth were then bulb suctioned and the cord was clamped twice and cut.  The   infant was handed off to the awaiting nursing staff.  Cord gases were   collected  as well as cord blood was collected.  The placenta was then   removed using gentle traction on the umbilical cord while fundal massage was   applied.  The placenta delivered spontaneously and intact.  The uterus was   then exteriorized and cleared of all clots and debris.  The uterine incision   was repaired with an 0 Vicryl stitch in a running locked fashion.  This layer   was hemostatic with the exception of a small area in the midline in which a 0   Vicryl  stitch was used to place a figure-of-eight and achieve complete   hemostasis.  Given that the patient was getting her tubal ligation, it was   not necessary to place a second layer given that the first layer was   hemostatic.  Attention was then turned to the patient's tubes where her right   tube was identified and followed out to the fimbriated end.  A segment of   tube was grasped approximately 3 cm from the cornual region and was grasped   with Babcock clamp.  A small hole was made in the mesosalpinx below this tube   and a portion of the tube was ligated and a 2 cm segment of tube was   resected.  This segment was sent to pathology.  Both ends of the tube were   noted to be hemostatic and the tube was returned to the abdomen.  Attention   was then turned to the patient's  left tube, which in a similar fashion was   grasped approximately 3 cm from the cornual region with the Babcock clamp and   the tube was resected in a similar fashion as the patient's right tube.   Again after the resection of a 2-3 cm segment of tube, both proximal and   distal ends were noted to be hemostatic and the tube was returned to the   abdomen.  Uterus was then returned to the abdomen and the lower uterine   incision was inspected one more time and again noted to be hemostatic.  Prior   to closure of the fascia, the tubes were inspected bilaterally within the   abdomen and noted to be hemostatic.  The bladder flap was left open and the   peritoneum was left open.  The gutters were then cleared of all clots and   debris and the fascia was reapproximated with an 0 PDS stitch in a running   fashion.  The subcuticular layer was then irrigated and Bovie was used where   necessary to achieve hemostasis.  The subcuticular layer measured 3 cm in   depth and thus was reapproximated with 3-0 Vicryl stitch in a running   fashion.  The skin was closed with 4-0 subcuticular Monocryl stitch.   Overall, the patient tolerated the procedure well.  Sponge, lap and needle   counts were correct x2.  Two grams of Ancef was given prior to the procedure   and the patient was taken to the recovery room in stable condition.  Again,   the estimated blood loss was 800 mL and complications were none.       *-*-*                                             _______________________________________   DM/kd                                  _____   D:  06/11/2010 04:34                  [name], M.D.   T:  06/11/2010 08:53                  Dictated by:  Ellard Artis, M.D.   Job #:  [id number]     c:  Anesthesia                                     OPERATIVE REPORT                                                                PAGE    1 of   1   D:  06/11/2010 04:34                  [name], M.D.   T:  06/11/2010 08:53                   Dictated by:  Ellard Artis, M.D.   Job #:  [id number]     c:   Anesthesia                                     OPERATIVE REPORT                                                                PAGE    1 of   1

## 2010-06-15 ENCOUNTER — Inpatient Hospital Stay

## 2010-07-23 NOTE — Unmapped (Signed)
Signed by Erskine Speed MD on 07/23/2010 at 16:36:56        Reason for Visit   Chief Complaint: here for post partum  History from: patient    Allergies  No Known Allergies    Medications  PRENATAL VITAMINS  TABS (PRENATAL MV & MIN W/FE-FA TABS)   AMOXICILLIN-POT CLAVULANATE 875-125 MG TABS (AMOXICILLIN-POT CLAVULANATE) Take 1 tab every 12 hours til all medicine is taken antibiotic for infection  PROVENTIL HFA  AERS (ALBUTEROL SULFATE AERS) prn  PEPCID 20 MG  TABS (FAMOTIDINE) Take 1 twice a day  FLUZONE PRESERVATIVE FREE  SUSP (INFLUENZA VIRUS VACC SPLIT PF)           Vital Signs  Height: 61 in.   Weight: 215.1 lbs.     BMI (in-lb): 40.79   BSA (m2): 1.95  Pulse rate: 80   Temperature: 97.8 degrees  F   Blood Pressure   BP #1: 110 / 70mm Hg  Cuff Size: Std     Pain:     Have you had pain other than everyday aches and pains (e.g., mild headache, back ache, strains) in the past week?   No    Intake recorded by: Collier Salina MA on July 23, 2010 12:47 PM                 Post Partum Visit     Delivery Information   Date of Delivery:  06/11/2010  Delivery Type:  Cesarean  Cesarean:  Repeat- Elective  Incision:  Low Transverse  Tubal Sterilization:  Yes  Delivered at: [facility]  Labor: None  Anesthesia: Spinal    Postpartum Information   Complications: None    Discharge Information     Neonatal Information   Name of Baby: Sophyia  Sex of Baby: Female  Birth Weight (lbs and oz): 6 lb 9 1/2 oz  Disposition:  Home with Mother  Newborn Care Provider:  Joni Reining  Has the baby been seen by the Pediatrician: Yes  Was the Pediatrician visit within 7 days of discharge: Yes    Maternal Information   Feeding Method:  Breast  HCT:   33.9 (06/02/2010 3:36:00 PM)  RH Type:   Positive (11/25/2009 1:40:00 PM)  Rhogam Comments: Rh positive  Rubella:    IMMUNE (11/25/2009 5:54:00 PM)  Rubella Comments: immune      Antepartum History:   Complete Problem List:   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   Serial ultrasounds for fetal growth    Plan of Care:   BTL  Repeat C/S at 39 weeks  Flu vaccine on 10/26        Post Partum Review of Systems:   Pain: no   Fever: no   Lochia: no   Bowel problems: no   Bladder problems: no   Perineum problems: no   Hemorrhoids: no   Nursing Problems: no   Intercourse yet: no   Dyspareunia: no   Substance Abuse: no   Depression: yes   Decreased Appetite: yesEPDS Score: 22  PAST HISTORY  Surgical History:  2-Wisdom teeth removed 2010, Cesarean Section: x4, No problems with anesthesia, No problems with healing, No problems with blood clots after surgery      PHYSICAL EXAMINATION  Constitutional: alert, no acute distress, well hydrated, well developed, well nourished;    Skin: normal color, no rashes, no lesions, warm to touch;    Head: atraumatic, normocephalic;    Eyes: pupils equal, no injection, no icterus;    Ears/Nose/Throat: external ears normal, hearing normal, external nose normal;    Neck: supple, no adenopathy, no masses, no thyromegaly;    Chest: no apparent respiratory distress, normal chest inspection, clear to auscultation, normal breath sounds bilaterally;    Cardiac:  normal S1 and S2, regular rate and rhythm, no murmurs, no gallops, no rubs;    Abdomen: nondistended, nontender, no guarding, no masses, no organomegaly, no abdominal hernias, no suprapubic tenderness;  incision well-healed.    External Genitalia:  no lesions, normal estrogen effect, normal hair pattern, no clitorimegaly, no erythema, normal perineal body;    Urethra:  normal appearance, no erythema, no tenderness, normal  urethral meatus;    Vagina:  no discharge, no lesions, no masses, no cystocele, no rectocele, normal support, normal rugations;    Cervix:  present, no lesions, no discharge, no cervical motion tenderness;    Uterus:  uterus midline, normal size, non-tender, regular contour, mobile;    Adnexa:  no masses, no tenderness;    Lymphatic: no cervical adenopathy, no supraclavicular adenopathy, no axillary adenopathy, no inguinal adenopathy, no femoral adenopathy, no enlargement;    Back: no deformities, normal spine, no cva tenderness;    Extremities: no edema, no lesions;    Neuro: normal mental status;    Psych: oriented to time; place; and person, affect and mood appropriate;          Assessment   24 year old Female     G: 4  P 3:     Term: 3   Preterm: 0   Ab: 0   # Living Children: 3  SAB: 0  EAB: 0  ECT: 0    Status of Existing Problems  Assessed POSTPARTUM DEPRESSION as comment only - EPDS = 22, Will start Zoloft 25 mg daily for 2 weeks then up to 50 mg  daily.  Sandee saw patient briefly as patient wanted to leave. Erskine Speed MD  Assessed POSTPARTUM EXAMINATION as comment only - Patient doing well physically, but wants to start something for depression. Patient aware of resources in [place] for therapy. No SI/HI.  RTO in 2 weeks for f/u of depression - Erskine Speed MD    Medications   New Prescriptions/Refills:  ZOLOFT 50 MG  TABS (SERTRALINE HCL) one by mouth daily  #30 x 1, 07/23/2010, Erskine Speed MD      Disposition:     *Patient Care Summary printed and given to patient.    Clinic: Resident [facility]  Primary OB/GYN Provider: Micael Hampshire  Return to clinic for Provider Visit in 2 week(s)   Appointment Reason: f/u postpartum depression      Preceptor Acknowledgements    I saw and examined the patient.  I discussed with the resident or fellow and agree with resident's/fellow's findings and plan as documented in the note.  HPI: ppv  no physical complaints  EPDS--22.  patient without active suicidal  ideation.    no h/o depression prior to pregnancy  states had some depressive symptoms during pregnancy  Physical Exam: alert,  obese  see Dr. Vito Berger exam  appropriate, good eye contact  A&P discussed pp depression  social services today  zoloft at 25 mg by mouth daily for one week then 50 mg by mouth daily.  possibility of worsening of symptoms discussed  return to clinic 2 weeks    Preceptor: Wilhelmenia Blase MD on July 23, 2010 1:36 PM      Prescriptions:  ZOLOFT 50 MG  TABS (SERTRALINE HCL) one by mouth daily  #30 x 1   Entered and Authorized by: Erskine Speed MD   Signed by: Erskine Speed MD on 07/23/2010   Method used: Print then Give to Patient   RxID: [id number]        Preventive Maintenance

## 2010-07-23 NOTE — Unmapped (Signed)
Signed by Erskine Speed MD on 07/23/2010 at 00:00:00  [facility] POSTNATAL DEPRESSION SCALE       Imported By: Georgianne Fick 07/27/2010 11:15:12    _____________________________________________________________________    External Attachment:    Please see [facility] EMR for this document.

## 2010-07-23 NOTE — Unmapped (Signed)
Signed by Fredrich Birks MSW LISW-S on 07/23/2010 at 14:36:50    SW briefly met w/pt as she presented w/elevated EPDS =22.  Pt resistant to see SW as stated she knows MH agency in [place].  Pt did agree to accept list of other agencies in case [facility] has long wait.  Pt stated she got her son into counseling there, so knows how to facilitate.  SW educated pt on [facility], and [facility].  Pt took info.  PT stated FOB helps w/kids as she now has 4 - newborn, 2y/o, 6y/o, & 25y/o.  Pt reports not sleeping well.  Suggest Fiance let her sleep for 1-2nights to allow her to recover more since is not sleeping well.  Pt in agreement to try medication, although reports no hx of such.  SW educate pt better to have more infomration that she doesn't need, than to need it and not know where to get it.  Pt shook head in agreement.  Pt given SW number for future needs.  Wish pt well

## 2010-08-15 NOTE — Unmapped (Signed)
Signed by Wilhelmenia Blase MD on 08/15/2010 at 20:36:30    Notification of completion of prenatal services   Prenatal care has been completed due to: Delivery  Date of completion: 06/11/2010

## 2011-02-16 NOTE — Unmapped (Signed)
Patient called stating that she had a BTL 06/11/10.  Now c/o recent nausea, fatigue and breast discharge.  Did a home UPT which was positive.  Discussed possibility of ectopic pregnancy.  Currently denies abdominal pain.  Advised patient to go to ER immediately for evaluation.

## 2013-06-08 DIAGNOSIS — Z1371 Encounter for nonprocreative screening for genetic disease carrier status: Secondary | ICD-10-CM

## 2013-06-08 HISTORY — DX: Encounter for nonprocreative screening for genetic disease carrier status: Z13.71

## 2013-12-06 LAB — HM PAP SMEAR: HM PAP: NORMAL

## 2014-01-08 ENCOUNTER — Ambulatory Visit: Payer: Self-pay | Admitting: Nurse Practitioner

## 2014-09-04 ENCOUNTER — Encounter: Payer: Self-pay | Admitting: Nurse Practitioner

## 2014-09-04 ENCOUNTER — Ambulatory Visit (INDEPENDENT_AMBULATORY_CARE_PROVIDER_SITE_OTHER): Admitting: Nurse Practitioner

## 2014-09-04 VITALS — BP 122/78 | HR 82 | Temp 98.2°F | Resp 12 | Ht 63.0 in | Wt 150.1 lb

## 2014-09-04 DIAGNOSIS — Z1389 Encounter for screening for other disorder: Secondary | ICD-10-CM | POA: Insufficient documentation

## 2014-09-04 DIAGNOSIS — F329 Major depressive disorder, single episode, unspecified: Secondary | ICD-10-CM | POA: Insufficient documentation

## 2014-09-04 DIAGNOSIS — Z0000 Encounter for general adult medical examination without abnormal findings: Secondary | ICD-10-CM

## 2014-09-04 DIAGNOSIS — Z7189 Other specified counseling: Secondary | ICD-10-CM

## 2014-09-04 DIAGNOSIS — F418 Other specified anxiety disorders: Secondary | ICD-10-CM

## 2014-09-04 DIAGNOSIS — Z7689 Persons encountering health services in other specified circumstances: Secondary | ICD-10-CM

## 2014-09-04 DIAGNOSIS — Z1329 Encounter for screening for other suspected endocrine disorder: Secondary | ICD-10-CM

## 2014-09-04 DIAGNOSIS — Z13220 Encounter for screening for lipoid disorders: Secondary | ICD-10-CM

## 2014-09-04 DIAGNOSIS — F419 Anxiety disorder, unspecified: Secondary | ICD-10-CM

## 2014-09-04 DIAGNOSIS — Z130 Encounter for screening for diseases of the blood and blood-forming organs and certain disorders involving the immune mechanism: Secondary | ICD-10-CM

## 2014-09-04 DIAGNOSIS — F32A Depression, unspecified: Secondary | ICD-10-CM

## 2014-09-04 LAB — CBC WITH DIFFERENTIAL/PLATELET
BASOS ABS: 0 10*3/uL (ref 0.0–0.1)
Basophils Relative: 0.5 % (ref 0.0–3.0)
Eosinophils Absolute: 0.1 10*3/uL (ref 0.0–0.7)
Eosinophils Relative: 1.3 % (ref 0.0–5.0)
HEMATOCRIT: 43.2 % (ref 36.0–46.0)
Hemoglobin: 15 g/dL (ref 12.0–15.0)
LYMPHS ABS: 3.3 10*3/uL (ref 0.7–4.0)
Lymphocytes Relative: 39 % (ref 12.0–46.0)
MCHC: 34.8 g/dL (ref 30.0–36.0)
MCV: 85.8 fl (ref 78.0–100.0)
MONO ABS: 0.5 10*3/uL (ref 0.1–1.0)
Monocytes Relative: 5.3 % (ref 3.0–12.0)
NEUTROS ABS: 4.6 10*3/uL (ref 1.4–7.7)
NEUTROS PCT: 53.9 % (ref 43.0–77.0)
PLATELETS: 236 10*3/uL (ref 150.0–400.0)
RBC: 5.03 Mil/uL (ref 3.87–5.11)
RDW: 12.5 % (ref 11.5–15.5)
WBC: 8.5 10*3/uL (ref 4.0–10.5)

## 2014-09-04 LAB — COMPREHENSIVE METABOLIC PANEL
ALK PHOS: 94 U/L (ref 39–117)
ALT: 47 U/L — ABNORMAL HIGH (ref 0–35)
AST: 26 U/L (ref 0–37)
Albumin: 4.7 g/dL (ref 3.5–5.2)
BILIRUBIN TOTAL: 1.2 mg/dL (ref 0.2–1.2)
BUN: 16 mg/dL (ref 6–23)
CALCIUM: 10 mg/dL (ref 8.4–10.5)
CHLORIDE: 104 meq/L (ref 96–112)
CO2: 22 meq/L (ref 19–32)
Creatinine, Ser: 0.79 mg/dL (ref 0.40–1.20)
GFR: 91.06 mL/min (ref >60.00)
Glucose, Bld: 89 mg/dL (ref 70–99)
Potassium: 4.3 mEq/L (ref 3.5–5.1)
SODIUM: 138 meq/L (ref 135–145)
Total Protein: 7.7 g/dL (ref 6.0–8.3)

## 2014-09-04 LAB — TSH: TSH: 2.26 u[IU]/mL (ref 0.35–4.50)

## 2014-09-04 LAB — LIPID PANEL
Cholesterol: 160 mg/dL (ref 0–200)
HDL: 35.7 mg/dL — AB (ref >39.00)
NonHDL: 124.3
TRIGLYCERIDES: 301 mg/dL — AB (ref 0.0–149.0)
Total CHOL/HDL Ratio: 4
VLDL: 60.2 mg/dL — ABNORMAL HIGH (ref 0.0–40.0)

## 2014-09-04 LAB — LDL CHOLESTEROL, DIRECT: Direct LDL: 99 mg/dL

## 2014-09-04 MED ORDER — ALPRAZOLAM 0.25 MG PO TABS: 0.2500 mg | ORAL_TABLET | Freq: Every evening | ORAL | Status: DC | PRN

## 2014-09-04 MED ORDER — CITALOPRAM HYDROBROMIDE 10 MG PO TABS: 10.0000 mg | ORAL_TABLET | Freq: Every day | ORAL | Status: DC

## 2014-09-04 NOTE — Progress Notes (Signed)
Pre visit review using our clinic review tool, if applicable. No additional management support is needed unless otherwise documented below in the visit note. 

## 2014-09-04 NOTE — Progress Notes (Signed)
Subjective:    Patient ID: Rebecca Davies, female    DOB: 18-Apr-1986, 29 y.o.   MRN: [id number]  HPI  Rebecca Davies is a 29 yo female with a CC of anxiety and depression. She is establishing care with us today.   She is coming from [place] and [place]. She has not seen a primary care provider in years she reports.   1) New pt-   Diet- Eating healthier, cooking at home   Exercise- 21 day fix. 3-4 x a week 30 min  Immunizations- Flu shot in Sept. At job  Mammogram- Too young, no family hx   Pap- May 2015, negative findings  Eye Exam- Not up to date  Dental Exam- Up to date   2) Acute Problems-  Increased anxiety and depression- went home the other day from work due to an anxiety attack. She reports it lasted for 45 min. Had chest tightness, crying spells, shaking, hyperventilation. This has been uncommon in the past few years. Rested, took a bath with lavender oils. Pt feels she has increased agitation.   Review of Systems  Constitutional: Negative for fever, chills, diaphoresis and fatigue.  HENT: Negative for sore throat, tinnitus and trouble swallowing.   Eyes: Negative for visual disturbance.  Respiratory: Negative for chest tightness, shortness of breath and wheezing.   Cardiovascular: Negative for chest pain, palpitations and leg swelling.  Gastrointestinal: Negative for nausea, vomiting, diarrhea and rectal pain.  Genitourinary: Negative for dysuria.  Musculoskeletal: Negative for back pain and arthralgias.  Skin: Negative for rash.  Neurological: Negative for dizziness, weakness, numbness and headaches.  Hematological: Bruises/bleeds easily.       Pt had peircings and bled for awhile  Psychiatric/Behavioral: Negative for suicidal ideas, sleep disturbance and self-injury. The patient is not nervous/anxious.    Past Medical History  Diagnosis Date  . Chicken pox   . Anxiety   . Migraine   . Depression     History   Social History  . Marital Status: Single    Spouse  Name: N/A    Number of Children: N/A  . Years of Education: N/A   Occupational History  . Not on file.   Social History Main Topics  . Smoking status: Never Smoker   . Smokeless tobacco: Not on file  . Alcohol Use: 0.6 oz/week    1 Not specified per week     Comment: 1 glass of wine every few weeks.   . Drug Use: No  . Sexual Activity:    Partners: Male     Comment: Husband   Other Topics Concern  . Not on file   Social History Narrative   From [place] and residing in [place]   Lives with husband   1 Son (5 yo)   1 cat in [place]   [employer] doing order entry    Enjoys movies, going to eat, beauty products     History reviewed. No pertinent past surgical history.  Family History  Problem Relation Age of Onset  . Cancer Mother     ovarian  . Hyperlipidemia Mother   . Mental illness Mother     anxiety and depression  . Alcohol abuse Father   . Hypertension Maternal Grandmother   . Heart disease Maternal Grandmother   . Stroke Maternal Grandmother   . Mental illness Maternal Grandmother   . Alcohol abuse Paternal Grandfather   . Cancer Paternal Grandfather     Allergies  Allergen Reactions  . Iodine  Other (See Comments)    Blisters  . Shellfish Allergy Swelling and Rash    No current outpatient prescriptions on file prior to visit.   No current facility-administered medications on file prior to visit.      Objective:   Physical Exam  Constitutional: She is oriented to person, place, and time. She appears well-developed and well-nourished. No distress.  BP 122/78 mmHg  Pulse 82  Temp(Src) 98.2 F (36.8 C) (Oral)  Resp 12  Ht  (1.6 m)  Wt 150 lb 1.9 oz (68.094 kg)  BMI 26.60 kg/m2  SpO2 98%   HENT:  Head: Normocephalic and atraumatic.  Right Ear: External ear normal.  Left Ear: External ear normal.  Eyes: Conjunctivae and EOM are normal. Pupils are equal, round, and reactive to light. Right eye exhibits no discharge. Left eye  exhibits no discharge. No scleral icterus.  Neck: Normal range of motion. Neck supple. No thyromegaly present.  Cardiovascular: Normal rate, regular rhythm, normal heart sounds and intact distal pulses.  Exam reveals no gallop and no friction rub.   No murmur heard. Pulmonary/Chest: Effort normal and breath sounds normal. No respiratory distress. She has no wheezes. She has no rales. She exhibits no tenderness.  Abdominal: Soft. She exhibits no distension. There is no tenderness. There is no rebound and no guarding.  Lymphadenopathy:    She has no cervical adenopathy.  Neurological: She is alert and oriented to person, place, and time. No cranial nerve deficit. She exhibits normal muscle tone. Coordination normal.  Skin: Skin is warm and dry. No rash noted. She is not diaphoretic.  Psychiatric: She has a normal mood and affect. Her behavior is normal. Judgment and thought content normal.      Assessment & Plan:

## 2014-09-04 NOTE — Assessment & Plan Note (Signed)
Worsening. Pt has lots of stress and she had a anxiety attack, which was the first recurrence in years. Citalopram 10 mg daily. Alprazolam 0.25 mg as needed for anxiety attacks. FU in 6 weeks.

## 2014-09-04 NOTE — Assessment & Plan Note (Signed)
Nice new patient establishing care today. Reviewed were basic health maintenance as well as PFSHx, obtain TSH, lipid, CMET, and CBC w/ diff today since she denies having blood work done in over a year. She is fasting today.

## 2014-09-04 NOTE — Patient Instructions (Signed)
Generalized Anxiety Disorder Generalized anxiety disorder (GAD) is a mental disorder. It interferes with life functions, including relationships, work, and school. GAD is different from normal anxiety, which everyone experiences at some point in their lives in response to specific life events and activities. Normal anxiety actually helps us prepare for and get through these life events and activities. Normal anxiety goes away after the event or activity is over.  GAD causes anxiety that is not necessarily related to specific events or activities. It also causes excess anxiety in proportion to specific events or activities. The anxiety associated with GAD is also difficult to control. GAD can vary from mild to severe. People with severe GAD can have intense waves of anxiety with physical symptoms (panic attacks).  SYMPTOMS The anxiety and worry associated with GAD are difficult to control. This anxiety and worry are related to many life events and activities and also occur more days than not for 6 months or longer. People with GAD also have three or more of the following symptoms (one or more in children):  Restlessness.   Fatigue.  Difficulty concentrating.   Irritability.  Muscle tension.  Difficulty sleeping or unsatisfying sleep. DIAGNOSIS GAD is diagnosed through an assessment by your health care provider. Your health care provider will ask you questions aboutyour mood,physical symptoms, and events in your life. Your health care provider may ask you about your medical history and use of alcohol or drugs, including prescription medicines. Your health care provider may also do a physical exam and blood tests. Certain medical conditions and the use of certain substances can cause symptoms similar to those associated with GAD. Your health care provider may refer you to a mental health specialist for further evaluation. TREATMENT The following therapies are usually used to treat GAD:    Medication. Antidepressant medication usually is prescribed for long-term daily control. Antianxiety medicines may be added in severe cases, especially when panic attacks occur.   Talk therapy (psychotherapy). Certain types of talk therapy can be helpful in treating GAD by providing support, education, and guidance. A form of talk therapy called cognitive behavioral therapy can teach you healthy ways to think about and react to daily life events and activities.  Stress managementtechniques. These include yoga, meditation, and exercise and can be very helpful when they are practiced regularly. A mental health specialist can help determine which treatment is best for you. Some people see improvement with one therapy. However, other people require a combination of therapies. Document Released: 11/19/2012 Document Revised: 12/09/2013 Document Reviewed: 11/19/2012 [employer] Patient Information 2015 [employer]. This information is not intended to replace advice given to you by your health care provider. Make sure you discuss any questions you have with your health care provider.  

## 2014-09-05 ENCOUNTER — Other Ambulatory Visit: Payer: Self-pay | Admitting: Nurse Practitioner

## 2014-09-05 DIAGNOSIS — R748 Abnormal levels of other serum enzymes: Secondary | ICD-10-CM

## 2014-09-05 DIAGNOSIS — E785 Hyperlipidemia, unspecified: Secondary | ICD-10-CM

## 2014-10-16 ENCOUNTER — Encounter: Payer: Self-pay | Admitting: Nurse Practitioner

## 2014-10-16 ENCOUNTER — Encounter (INDEPENDENT_AMBULATORY_CARE_PROVIDER_SITE_OTHER): Payer: Self-pay

## 2014-10-16 ENCOUNTER — Ambulatory Visit (INDEPENDENT_AMBULATORY_CARE_PROVIDER_SITE_OTHER): Admitting: Nurse Practitioner

## 2014-10-16 VITALS — BP 114/70 | HR 93 | Temp 98.4°F | Resp 16 | Ht 61.0 in | Wt 147.4 lb

## 2014-10-16 DIAGNOSIS — F329 Major depressive disorder, single episode, unspecified: Secondary | ICD-10-CM

## 2014-10-16 DIAGNOSIS — F419 Anxiety disorder, unspecified: Principal | ICD-10-CM

## 2014-10-16 DIAGNOSIS — F418 Other specified anxiety disorders: Secondary | ICD-10-CM

## 2014-10-16 MED ORDER — ALPRAZOLAM 0.5 MG PO TABS: 0.5000 mg | ORAL_TABLET | Freq: Every evening | ORAL | Status: DC | PRN

## 2014-10-16 MED ORDER — CITALOPRAM HYDROBROMIDE 20 MG PO TABS: 20.0000 mg | ORAL_TABLET | Freq: Every day | ORAL | Status: DC

## 2014-10-16 NOTE — Patient Instructions (Signed)
Let us know if you need anything and good luck starting your new job!

## 2014-10-16 NOTE — Progress Notes (Signed)
   Subjective:    Patient ID: Rebecca Davies, female    DOB: 06-20-1986, 29 y.o.   MRN: [id number]  HPI  [name] is a 29 yo female following up on Anxiety and Depression.   1) No change, She reports more anxiety lately due to new job. Took Xanax a few nights- helped her sleep  Last dose on the 6th       Review of Systems  Constitutional: Negative for fever, chills, diaphoresis and fatigue.  Respiratory: Negative for chest tightness, shortness of breath and wheezing.   Cardiovascular: Negative for chest pain, palpitations and leg swelling.  Gastrointestinal: Negative for nausea, vomiting and diarrhea.  Skin: Negative for rash.  Neurological: Negative for dizziness, weakness, numbness and headaches.  Psychiatric/Behavioral: The patient is not nervous/anxious.        Objective:   Physical Exam  Constitutional: She is oriented to person, place, and time. She appears well-developed and well-nourished. No distress.  BP 114/70 mmHg  Pulse 93  Temp(Src) 98.4 F (36.9 C) (Oral)  Resp 16  Ht 5\' 1"  (1.549 m)  Wt 147 lb 6.4 oz (66.86 kg)  BMI 27.87 kg/m2  SpO2 97%   HENT:  Head: Normocephalic and atraumatic.  Right Ear: External ear normal.  Left Ear: External ear normal.  Cardiovascular: Normal rate, regular rhythm, normal heart sounds and intact distal pulses.  Exam reveals no gallop and no friction rub.   No murmur heard. Pulmonary/Chest: Effort normal and breath sounds normal. No respiratory distress. She has no wheezes. She has no rales. She exhibits no tenderness.  Neurological: She is alert and oriented to person, place, and time. No cranial nerve deficit. She exhibits normal muscle tone. Coordination normal.  Skin: Skin is warm and dry. No rash noted. She is not diaphoretic.  Psychiatric: She has a normal mood and affect. Her behavior is normal. Judgment and thought content normal.      Assessment & Plan:

## 2014-10-16 NOTE — Assessment & Plan Note (Signed)
Stable. Citalopram upped to 20 mg daily and Alprazolam 0.5 mg at night as needed. FU as needed. Pt has a new job and starts the 21st of this month with [employer].

## 2014-10-16 NOTE — Progress Notes (Signed)
Pre visit review using our clinic review tool, if applicable. No additional management support is needed unless otherwise documented below in the visit note. 

## 2014-11-14 ENCOUNTER — Encounter: Payer: Self-pay | Admitting: Nurse Practitioner

## 2014-11-29 ENCOUNTER — Emergency Department (HOSPITAL_COMMUNITY)
Admission: EM | Admit: 2014-11-29 | Discharge: 2014-11-29 | Disposition: A | Discharge status: Home / Self Care | Payer: No Typology Code available for payment source | Attending: Emergency Medicine | Admitting: Emergency Medicine

## 2014-11-29 ENCOUNTER — Encounter (HOSPITAL_COMMUNITY): Payer: Self-pay | Admitting: Emergency Medicine

## 2014-11-29 DIAGNOSIS — Y92410 Unspecified street and highway as the place of occurrence of the external cause: Secondary | ICD-10-CM | POA: Insufficient documentation

## 2014-11-29 DIAGNOSIS — S39012A Strain of muscle, fascia and tendon of lower back, initial encounter: Secondary | ICD-10-CM | POA: Insufficient documentation

## 2014-11-29 DIAGNOSIS — Y998 Other external cause status: Secondary | ICD-10-CM | POA: Insufficient documentation

## 2014-11-29 DIAGNOSIS — Z79899 Other long term (current) drug therapy: Secondary | ICD-10-CM | POA: Diagnosis not present

## 2014-11-29 DIAGNOSIS — F329 Major depressive disorder, single episode, unspecified: Secondary | ICD-10-CM | POA: Insufficient documentation

## 2014-11-29 DIAGNOSIS — Y9389 Activity, other specified: Secondary | ICD-10-CM | POA: Diagnosis not present

## 2014-11-29 DIAGNOSIS — F419 Anxiety disorder, unspecified: Secondary | ICD-10-CM | POA: Diagnosis not present

## 2014-11-29 DIAGNOSIS — S3992XA Unspecified injury of lower back, initial encounter: Secondary | ICD-10-CM | POA: Diagnosis present

## 2014-11-29 DIAGNOSIS — Z8619 Personal history of other infectious and parasitic diseases: Secondary | ICD-10-CM | POA: Diagnosis not present

## 2014-11-29 DIAGNOSIS — Z8679 Personal history of other diseases of the circulatory system: Secondary | ICD-10-CM | POA: Diagnosis not present

## 2014-11-29 MED ORDER — IBUPROFEN 200 MG PO TABS
400.0000 mg | ORAL_TABLET | Freq: Once | ORAL | Status: AC
  Administered 2014-11-29: 400 mg via ORAL
  Filled 2014-11-29: qty 2

## 2014-11-29 MED ORDER — ALPRAZOLAM 0.5 MG PO TABS
0.5000 mg | ORAL_TABLET | Freq: Once | ORAL | Status: AC
  Administered 2014-11-29: 0.5 mg via ORAL
  Filled 2014-11-29: qty 1

## 2014-11-29 MED ORDER — METHOCARBAMOL 500 MG PO TABS: 1000.0000 mg | ORAL_TABLET | Freq: Three times a day (TID) | ORAL | Status: DC | PRN
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
## 2014-11-29 NOTE — ED Notes (Signed)
Verbalized understanding discharge instructions. In no acute distress.  Pt educated regarding pain medication and that she would feel worse tomorrow.
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
## 2015-02-20 ENCOUNTER — Telehealth: Payer: Self-pay | Admitting: *Deleted

## 2015-02-20 NOTE — Telephone Encounter (Signed)
Pt called states she misplaced her Alprazolam Rx.  As per chart last OV and refill 3.10.16.  Please advise

## 2015-02-23 ENCOUNTER — Other Ambulatory Visit: Payer: Self-pay | Admitting: Nurse Practitioner

## 2015-02-23 MED ORDER — ALPRAZOLAM 0.5 MG PO TABS: 0.5000 mg | ORAL_TABLET | Freq: Every evening | ORAL | Status: DC | PRN

## 2015-02-23 NOTE — Telephone Encounter (Signed)
I checked the [place] database and she never filled anything past Jan. I will print and sign. Please let me know which pharmacy she wants it to go to. Thanks!

## 2015-02-23 NOTE — Telephone Encounter (Signed)
Spoke with pt, [facility]

## 2015-02-23 NOTE — Telephone Encounter (Signed)
[facility]

## 2015-08-21 ENCOUNTER — Ambulatory Visit (INDEPENDENT_AMBULATORY_CARE_PROVIDER_SITE_OTHER): Admitting: Nurse Practitioner

## 2015-08-21 ENCOUNTER — Encounter: Payer: Self-pay | Admitting: Nurse Practitioner

## 2015-08-21 VITALS — BP 104/76 | HR 84 | Temp 98.1°F | Resp 14 | Ht 61.0 in | Wt 151.0 lb

## 2015-08-21 DIAGNOSIS — H5713 Ocular pain, bilateral: Secondary | ICD-10-CM | POA: Diagnosis not present

## 2015-08-21 DIAGNOSIS — F418 Other specified anxiety disorders: Secondary | ICD-10-CM | POA: Diagnosis not present

## 2015-08-21 DIAGNOSIS — F329 Major depressive disorder, single episode, unspecified: Secondary | ICD-10-CM

## 2015-08-21 DIAGNOSIS — F419 Anxiety disorder, unspecified: Secondary | ICD-10-CM

## 2015-08-21 MED ORDER — FLUTICASONE PROPIONATE 50 MCG/ACT NA SUSP: 2.0000 | Freq: Every day | NASAL | Status: DC

## 2015-08-21 MED ORDER — BUPROPION HCL ER (XL) 150 MG PO TB24: 150.0000 mg | ORAL_TABLET | Freq: Every day | ORAL | Status: DC
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
## 2015-08-21 NOTE — Patient Instructions (Addendum)
Follow up in 4 weeks for eye discomfort and medication follow up  [facility] [place] [facility]

## 2015-08-25 NOTE — Assessment & Plan Note (Addendum)
HA behind eyes New problem  20/20 vision on snellen chart No recent eye exam- gave her a recommendation  Trying flonase- sent to pharmacy FU prn worsening/failure to improve.

## 2015-08-25 NOTE — Assessment & Plan Note (Signed)
Established problem worsening Pt willing to try Wellbutrin and Xanax prn  FU in 4 weeks Advised about risks, benefits, and side effects of each Call if any questions

## 2015-09-21 ENCOUNTER — Encounter: Payer: Self-pay | Admitting: Nurse Practitioner

## 2015-09-21 ENCOUNTER — Ambulatory Visit (INDEPENDENT_AMBULATORY_CARE_PROVIDER_SITE_OTHER): Admitting: Nurse Practitioner

## 2015-09-21 VITALS — BP 102/70 | HR 86 | Temp 98.3°F | Ht 61.0 in | Wt 148.5 lb

## 2015-09-21 DIAGNOSIS — F419 Anxiety disorder, unspecified: Principal | ICD-10-CM

## 2015-09-21 DIAGNOSIS — F329 Major depressive disorder, single episode, unspecified: Secondary | ICD-10-CM

## 2015-09-21 DIAGNOSIS — F418 Other specified anxiety disorders: Secondary | ICD-10-CM | POA: Diagnosis not present

## 2015-09-21 DIAGNOSIS — F32A Depression, unspecified: Secondary | ICD-10-CM

## 2015-09-21 MED ORDER — BUPROPION HCL ER (XL) 300 MG PO TB24: 300.0000 mg | ORAL_TABLET | Freq: Every day | ORAL | Status: DC

## 2015-09-21 NOTE — Progress Notes (Signed)
Patient ID: Rebecca Davies, female    DOB: August 11, 1985  Age: 30 y.o. MRN: [id number]  CC: Follow-up   HPI Rebecca Davies presents for follow up of eye pain and Anxiety and depression.   1) Eye pain- Eye exam from optometrist was unremarkable Felt it was strain  Has new glasses   2) Anxiety and Depression-  Wellbutrin- fewer outbursts, feels calmer  Xanax- Needed, 1 month and a half   History Rebecca Davies has a past medical history of Chicken pox; Anxiety; Migraine; and Depression.   She has no past surgical history on file.   Her family history includes Alcohol abuse in her father and paternal grandfather; Cancer in her mother and paternal grandfather; Heart disease in her maternal grandmother; Hyperlipidemia in her mother; Hypertension in her maternal grandmother; Mental illness in her maternal grandmother and mother; Stroke in her maternal grandmother.She reports that she has never smoked. She does not have any smokeless tobacco history on file. She reports that she drinks about 0.6 oz of alcohol per week. She reports that she does not use illicit drugs.  Outpatient Prescriptions Prior to Visit  Medication Sig Dispense Refill  . ALPRAZolam (XANAX) 0.5 MG tablet Take 1 tablet (0.5 mg total) by mouth at bedtime as needed for anxiety. 30 tablet 0  . fluticasone (FLONASE) 50 MCG/ACT nasal spray Place 2 sprays into both nostrils daily. 16 g 6  . buPROPion (WELLBUTRIN XL) 150 MG 24 hr tablet Take 1 tablet (150 mg total) by mouth daily. 30 tablet 0  . methocarbamol (ROBAXIN) 500 MG tablet Take 2 tablets (1,000 mg total) by mouth 3 (three) times daily as needed for muscle spasms. (Patient not taking: Reported on 09/21/2015) 20 tablet 0   No facility-administered medications prior to visit.    ROS Review of Systems  Constitutional: Negative for fever, chills, diaphoresis and fatigue.  Respiratory: Negative for chest tightness, shortness of breath and wheezing.   Cardiovascular: Negative for chest  pain, palpitations and leg swelling.  Gastrointestinal: Negative for nausea, vomiting and diarrhea.  Skin: Negative for rash.  Neurological: Negative for dizziness, weakness, numbness and headaches.  Psychiatric/Behavioral: The patient is nervous/anxious.        Improved with med use    Objective:  BP 102/70 mmHg  Pulse 86  Temp(Src) 98.3 F (36.8 C) (Oral)  Ht  (1.549 m)  Wt 148 lb 8 oz (67.359 kg)  BMI 28.07 kg/m2  SpO2 97%  Physical Exam  Constitutional: She is oriented to person, place, and time. She appears well-developed and well-nourished. No distress.  HENT:  Head: Normocephalic and atraumatic.  Right Ear: External ear normal.  Left Ear: External ear normal.  Eyes: EOM are normal. Pupils are equal, round, and reactive to light. Right eye exhibits no discharge. Left eye exhibits no discharge. No scleral icterus.  Cardiovascular: Normal rate, regular rhythm and normal heart sounds.  Exam reveals no gallop and no friction rub.   No murmur heard. Pulmonary/Chest: Effort normal and breath sounds normal. No respiratory distress. She has no wheezes. She has no rales. She exhibits no tenderness.  Neurological: She is alert and oriented to person, place, and time. No cranial nerve deficit. She exhibits normal muscle tone. Coordination normal.  Skin: Skin is warm and dry. No rash noted. She is not diaphoretic.  Psychiatric: She has a normal mood and affect. Her behavior is normal. Judgment and thought content normal.   Assessment & Plan:   There are no diagnoses linked to this encounter.  I have discontinued Ms. Plotz's methocarbamol and buPROPion. I am also having her start on buPROPion. Additionally, I am having her maintain her ALPRAZolam and fluticasone.  Meds ordered this encounter  Medications  . buPROPion (WELLBUTRIN XL) 300 MG 24 hr tablet    Sig: Take 1 tablet (300 mg total) by mouth daily.    Dispense:  30 tablet    Refill:  0    Order Specific Question:   Supervising Provider    Answer:  Sherlene Shams [[id number]]     Follow-up: Return in about 4 weeks (around 10/19/2015) for medication follow up .

## 2015-09-21 NOTE — Patient Instructions (Signed)
Try the Wellbutrin XL 300 mg. Follow up in 4 weeks.

## 2015-09-21 NOTE — Progress Notes (Signed)
Pre visit review using our clinic review tool, if applicable. No additional management support is needed unless otherwise documented below in the visit note. 

## 2015-09-25 NOTE — Assessment & Plan Note (Signed)
Pt is stable currently on Wellbutrin XL, but would like to try the next dosage up to see if more helpful. Sent 300 mg dose into pharmacy to try for 1 month FU in 4 weeks Denies need for xanax prescription

## 2015-10-23 ENCOUNTER — Ambulatory Visit: Admitting: Nurse Practitioner

## 2015-10-29 ENCOUNTER — Ambulatory Visit (INDEPENDENT_AMBULATORY_CARE_PROVIDER_SITE_OTHER): Admitting: Nurse Practitioner

## 2015-10-29 ENCOUNTER — Encounter: Payer: Self-pay | Admitting: Nurse Practitioner

## 2015-10-29 VITALS — BP 104/68 | HR 88 | Temp 98.2°F | Resp 14 | Ht 61.0 in | Wt 152.6 lb

## 2015-10-29 DIAGNOSIS — F418 Other specified anxiety disorders: Secondary | ICD-10-CM

## 2015-10-29 DIAGNOSIS — Z23 Encounter for immunization: Secondary | ICD-10-CM

## 2015-10-29 DIAGNOSIS — S39012D Strain of muscle, fascia and tendon of lower back, subsequent encounter: Secondary | ICD-10-CM

## 2015-10-29 DIAGNOSIS — F329 Major depressive disorder, single episode, unspecified: Secondary | ICD-10-CM

## 2015-10-29 DIAGNOSIS — F419 Anxiety disorder, unspecified: Secondary | ICD-10-CM

## 2015-10-29 MED ORDER — METHOCARBAMOL 500 MG PO TABS
500.0000 mg | ORAL_TABLET | Freq: Every day | ORAL | Status: DC
Start: 2015-10-29 — End: 2016-03-23

## 2015-10-29 MED ORDER — BUPROPION HCL ER (XL) 300 MG PO TB24: 300.0000 mg | ORAL_TABLET | Freq: Every day | ORAL | Status: DC

## 2015-10-29 NOTE — Patient Instructions (Addendum)
Robaxin for muscle spasms.  Yay for the 300 mg Wellbutrin being helpful.   See you in 6 months!
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
## 2015-11-01 NOTE — Assessment & Plan Note (Signed)
Stable currently at Wellbutrin 300 mg Refilled today Not using xanax often, only prn

## 2015-11-01 NOTE — Assessment & Plan Note (Signed)
Pt willing to try Robaxin once again Sent to pharmacy Discussed not driving or operating machinery FU prn worsening/failure to improve.

## 2016-03-23 ENCOUNTER — Encounter: Payer: Self-pay | Admitting: Family Medicine

## 2016-03-23 ENCOUNTER — Ambulatory Visit (INDEPENDENT_AMBULATORY_CARE_PROVIDER_SITE_OTHER): Admitting: Family Medicine

## 2016-03-23 ENCOUNTER — Ambulatory Visit: Admitting: Family Medicine

## 2016-03-23 DIAGNOSIS — L255 Unspecified contact dermatitis due to plants, except food: Secondary | ICD-10-CM

## 2016-03-23 MED ORDER — PREDNISONE 10 MG PO TABS: ORAL_TABLET | ORAL | 0 refills | Status: DC

## 2016-03-23 NOTE — Patient Instructions (Signed)
Take the medication as prescribed.  Follow up as needed.  Take care  Dr. Illana Nolting  

## 2016-03-23 NOTE — Progress Notes (Signed)
Subjective:  Patient ID: Rebecca Davies, female    DOB: 1986/01/03  Age: 30 y.o. MRN: [id number]  CC: Poison Oak   HPI:  30 year old female presents with the above complaint.  Patient states that she was pulling weeds 2 days ago and subsequently developed a rash consistent with poison oak or poison ivy. Patient states that it is located on her ears, left upper lip, lower leg. Mild itching. No medications or interventions tried. No exacerbating or relieving factors. No other complaints at this time.  Social Hx   Social History   Social History  . Marital status: Single    Spouse name: N/A  . Number of children: N/A  . Years of education: N/A   Social History Main Topics  . Smoking status: Never Smoker  . Smokeless tobacco: None  . Alcohol use 0.6 oz/week    1 Standard drinks or equivalent per week     Comment: 1 glass of wine every few weeks.   . Drug use: No  . Sexual activity: Yes    Partners: Male     Comment: Husband   Other Topics Concern  . None   Social History Narrative   From [place] and residing in [place]   Lives with husband   1 Son (5 yo)   [place]   Rebecca Davies    Enjoys movies, going to eat, beauty products    Review of Systems  Constitutional: Negative.   Skin: Positive for rash.   Objective:  BP 115/83 (BP Location: Right Arm, Patient Position: Sitting, Cuff Size: Normal)   Pulse 84   Temp 98.4 F (36.9 C) (Oral)   Wt 148 lb 2 oz (67.2 kg)   SpO2 98%   BMI 27.99 kg/m   BP/Weight 03/23/2016 10/29/2015 09/21/2015  Systolic BP 115 104 102  Diastolic BP 83 68 70  Wt. (Lbs) 148.13 152.6 148.5  BMI 27.99 28.85 28.07   Physical Exam  Constitutional: She is oriented to person, place, and time. She appears well-developed. No distress.  Pulmonary/Chest: Effort normal. She has no wheezes. She has no rales.  Neurological: She is alert and oriented to person, place, and time.  Skin:  Face - left upper lip with  swelling and erythematous rash. Crusting noted. Left lower arm with linear vesicular rash.  Psychiatric: She has a normal mood and affect.  Vitals reviewed.  Lab Results  Component Value Date   WBC 8.5 09/04/2014   HGB 15.0 09/04/2014   HCT 43.2 09/04/2014   PLT 236.0 09/04/2014   GLUCOSE 89 09/04/2014   CHOL 160 09/04/2014   TRIG 301.0 (H) 09/04/2014   HDL 35.70 (L) 09/04/2014   LDLDIRECT 99.0 09/04/2014   ALT 47 (H) 09/04/2014   AST 26 09/04/2014   NA 138 09/04/2014   K 4.3 09/04/2014   CL 104 09/04/2014   CREATININE 0.79 09/04/2014   BUN 16 09/04/2014   CO2 22 09/04/2014   TSH 2.26 09/04/2014    Assessment & Plan:   Problem List Items Addressed This Visit    Dermatitis due to plants, including poison ivy, sumac, and oak    New acute problem. Given swelling and location, treating with prednisone taper.        Other Visit Diagnoses   None.    Meds ordered this encounter  Medications  . predniSONE (DELTASONE) 10 MG tablet    Sig: 50 mg (5 tablets) daily x 4 days, then 40 mg (4  tablets) daily x 4 days, then 30 mg (3 tablets) daily x 4 days, then 20 mg (2 tablets) daily x 4 days, then 10 mg (1 tablet) daily x 4 days.    Dispense:  60 tablet    Refill:  0    Follow-up: PRN  Everlene OtherJayce Sabrina Arriaga DO [facility] [place]

## 2016-03-23 NOTE — Assessment & Plan Note (Signed)
New acute problem. Given swelling and location, treating with prednisone taper.

## 2016-03-23 NOTE — Progress Notes (Signed)
Pre visit review using our clinic review tool, if applicable. No additional management support is needed unless otherwise documented below in the visit note. 

## 2016-04-29 ENCOUNTER — Ambulatory Visit: Admitting: Nurse Practitioner

## 2016-05-04 ENCOUNTER — Ambulatory Visit: Admitting: Family Medicine

## 2016-05-09 ENCOUNTER — Encounter: Payer: Self-pay | Admitting: Family Medicine

## 2016-05-09 ENCOUNTER — Ambulatory Visit (INDEPENDENT_AMBULATORY_CARE_PROVIDER_SITE_OTHER): Admitting: Family Medicine

## 2016-05-09 DIAGNOSIS — M62830 Muscle spasm of back: Secondary | ICD-10-CM | POA: Insufficient documentation

## 2016-05-09 DIAGNOSIS — F329 Major depressive disorder, single episode, unspecified: Secondary | ICD-10-CM

## 2016-05-09 DIAGNOSIS — F418 Other specified anxiety disorders: Secondary | ICD-10-CM

## 2016-05-09 DIAGNOSIS — F419 Anxiety disorder, unspecified: Principal | ICD-10-CM

## 2016-05-09 MED ORDER — METHOCARBAMOL 500 MG PO TABS: 500.0000 mg | ORAL_TABLET | Freq: Every day | ORAL | 1 refills | Status: DC

## 2016-05-09 MED ORDER — ALPRAZOLAM 0.5 MG PO TABS: 0.5000 mg | ORAL_TABLET | Freq: Every evening | ORAL | 1 refills | Status: DC | PRN

## 2016-05-09 MED ORDER — BUPROPION HCL ER (XL) 300 MG PO TB24: 300.0000 mg | ORAL_TABLET | Freq: Every day | ORAL | 1 refills | Status: DC

## 2016-05-09 NOTE — Progress Notes (Signed)
Subjective:  Patient ID: Rebecca Davies, female    DOB: 1986-01-21  Age: 30 y.o. MRN: [id number]  CC: Follow up, medication refill   HPI:  30 year old female with anxiety and depression presents for follow-up.  Anxiety/depression  Patient reports that her symptoms have been fairly well controlled. She states that she does have her "moments".  She is currently taking Wellbutrin and PRN Xanax. She is in need of refill today.  Back spasms  Stable with PRN Robaxin.  In need of refill.  Social Hx   Social History   Social History  . Marital status: Single    Spouse name: N/A  . Number of children: N/A  . Years of education: N/A   Social History Main Topics  . Smoking status: Never Smoker  . Smokeless tobacco: None  . Alcohol use 0.6 oz/week    1 Standard drinks or equivalent per week     Comment: 1 glass of wine every few weeks.   . Drug use: No  . Sexual activity: Yes    Partners: Male     Comment: Husband   Other Topics Concern  . 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   Musculoskeletal: Positive for back pain.  Psychiatric/Behavioral: The patient is nervous/anxious.    Objective:  BP 118/78 (BP Location: Right Arm, Patient Position: Sitting, Cuff Size: Normal)   Pulse 83   Temp 98.4 F (36.9 C) (Oral)   Wt 160 lb 4 oz (72.7 kg)   SpO2 97%   BMI 30.28 kg/m   BP/Weight 05/09/2016 03/23/2016 10/29/2015  Systolic BP 118 115 104  Diastolic BP 78 83 68  Wt. (Lbs) 160.25 148.13 152.6  BMI 30.28 27.99 28.85   Physical Exam  Constitutional: She is oriented to person, place, and time. She appears well-developed. No distress.  Cardiovascular: Normal rate and regular rhythm.   Pulmonary/Chest: Effort normal. She has no wheezes. She has  no rales.  Neurological: She is alert and oriented to person, place, and time.  Psychiatric:  Flat affect.   Vitals reviewed.  Lab Results  Component Value Date   WBC 8.5 09/04/2014   HGB 15.0 09/04/2014   HCT 43.2 09/04/2014   PLT 236.0 09/04/2014   GLUCOSE 89 09/04/2014   CHOL 160 09/04/2014   TRIG 301.0 (H) 09/04/2014   HDL 35.70 (L) 09/04/2014   LDLDIRECT 99.0 09/04/2014   ALT 47 (H) 09/04/2014   AST 26 09/04/2014   NA 138 09/04/2014   K 4.3 09/04/2014   CL 104 09/04/2014   CREATININE 0.79 09/04/2014   BUN 16 09/04/2014   CO2 22 09/04/2014   TSH 2.26 09/04/2014    Assessment & Plan:   Problem List Items Addressed This Visit    Anxiety and depression    Stable. Wellbutrin and Xanax refilled today.       Muscle spasm of back    Stable with PRN Robaxin. Refilled today.       Other Visit Diagnoses   None.     Meds ordered this encounter  Medications  . DISCONTD: methocarbamol (ROBAXIN) 500 MG tablet    Sig: Take 500 mg by mouth at bedtime.  . ALPRAZolam (XANAX) 0.5 MG tablet  Sig: Take 1 tablet (0.5 mg total) by mouth at bedtime as needed for anxiety.    Dispense:  30 tablet    Refill:  1  . buPROPion (WELLBUTRIN XL) 300 MG 24 hr tablet    Sig: Take 1 tablet (300 mg total) by mouth daily.    Dispense:  90 tablet    Refill:  1  . methocarbamol (ROBAXIN) 500 MG tablet    Sig: Take 1 tablet (500 mg total) by mouth at bedtime.    Dispense:  30 tablet    Refill:  1    Follow-up: 6 months to 1 year  Rebecca OtherJayce Tiarrah Saville DO [facility] [place]

## 2016-05-09 NOTE — Assessment & Plan Note (Signed)
Stable. Wellbutrin and Xanax refilled today.

## 2016-05-09 NOTE — Assessment & Plan Note (Signed)
Stable with PRN Robaxin. Refilled today.

## 2016-05-09 NOTE — Progress Notes (Signed)
Pre visit review using our clinic review tool, if applicable. No additional management support is needed unless otherwise documented below in the visit note. 

## 2016-05-09 NOTE — Patient Instructions (Signed)
It was nice to see you today.  I have refilled your meds.  Follow up in 6 months to 1 year.

## 2016-07-08 ENCOUNTER — Ambulatory Visit

## 2016-07-15 ENCOUNTER — Ambulatory Visit (INDEPENDENT_AMBULATORY_CARE_PROVIDER_SITE_OTHER)

## 2016-07-15 DIAGNOSIS — Z23 Encounter for immunization: Secondary | ICD-10-CM

## 2016-08-23 ENCOUNTER — Ambulatory Visit (INDEPENDENT_AMBULATORY_CARE_PROVIDER_SITE_OTHER): Admitting: Family Medicine

## 2016-08-23 ENCOUNTER — Encounter: Payer: Self-pay | Admitting: Family Medicine

## 2016-08-23 DIAGNOSIS — J209 Acute bronchitis, unspecified: Secondary | ICD-10-CM | POA: Insufficient documentation

## 2016-08-23 MED ORDER — HYDROCOD POLST-CPM POLST ER 10-8 MG/5ML PO SUER: 5.0000 mL | Freq: Two times a day (BID) | ORAL | 0 refills | Status: DC | PRN

## 2016-08-23 MED ORDER — PREDNISONE 50 MG PO TABS: ORAL_TABLET | ORAL | 0 refills | Status: DC

## 2016-08-23 NOTE — Assessment & Plan Note (Signed)
New acute problem. Treating with prednisone and Tussionex.

## 2016-08-23 NOTE — Progress Notes (Signed)
Subjective:  Patient ID: Rebecca Davies, female    DOB: [birth year]  Age: 31 y.o. MRN: [id number]  CC: URI, Cough  HPI:  31 year old female presents for an acute visit with the above complaints.  Patient states that she's had upper respiratory infection the past week. She's had sinus pressure and congestion and associated pain. She has used several over-the-counter medications and has had some improvement in her symptoms. She is now quite troubled by cough. Cough is mildly productive. Keep syrup at night. No associated fever or chills. No shortness of breath. Cough is severe. No other complaints or concerns at this time.  Social Hx   Social History   Social History  . Marital status: Single    Spouse name: N/A  . Number of children: N/A  . Years of education: N/A   Social History Main Topics  . Smoking status: Never Smoker  . Smokeless tobacco: None  . Alcohol use 0.6 oz/week    1 Standard drinks or equivalent per week     Comment: 1 glass of wine every few weeks.   . Drug use: No  . Sexual activity: Yes    Partners: Male     Comment: Husband   Other Topics Concern  . 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  HENT: Positive for congestion, sinus pain and sinus pressure.   Respiratory: Positive for cough.    Objective:  BP 123/86   Pulse 88   Temp 98.3 F (36.8 C) (Oral)   Resp 14   Wt 158 lb 9.6 oz (71.9 kg)   SpO2 97%   BMI 29.97 kg/m   BP/Weight 08/23/2016 05/09/2016 03/23/2016  Systolic BP 123 118 115  Diastolic BP 86 78 83  Wt. (Lbs) 158.6 160.25 148.13  BMI 29.97 30.28 27.99   Physical Exam  Constitutional: She is oriented to person, place, and time. She appears well-developed. No distress.  HENT:  Mouth/Throat:  Oropharynx is clear and moist.  Eyes: Conjunctivae are normal.  Cardiovascular: Normal rate and regular rhythm.   Pulmonary/Chest: Effort normal and breath sounds normal.  Neurological: She is alert and oriented to person, place, and time.  Psychiatric: She has a normal mood and affect.  Vitals reviewed.  Lab Results  Component Value Date   WBC 8.5 09/04/2014   HGB 15.0 09/04/2014   HCT 43.2 09/04/2014   PLT 236.0 09/04/2014   GLUCOSE 89 09/04/2014   CHOL 160 09/04/2014   TRIG 301.0 (H) 09/04/2014   HDL 35.70 (L) 09/04/2014   LDLDIRECT 99.0 09/04/2014   ALT 47 (H) 09/04/2014   AST 26 09/04/2014   NA 138 09/04/2014   K 4.3 09/04/2014   CL 104 09/04/2014   CREATININE 0.79 09/04/2014   BUN 16 09/04/2014   CO2 22 09/04/2014   TSH 2.26 09/04/2014    Assessment & Plan:   Problem List Items Addressed This Visit    Acute bronchitis    New acute problem. Treating with prednisone and Tussionex.        Meds ordered this encounter  Medications  . chlorpheniramine-HYDROcodone (TUSSIONEX PENNKINETIC ER) 10-8 MG/5ML SUER    Sig: Take 5 mLs by mouth every 12 (twelve) hours  as needed.    Dispense:  115 mL    Refill:  0  . predniSONE (DELTASONE) 50 MG tablet    Sig: 1 tablet daily x 5 days.    Dispense:  5 tablet    Refill:  0    Follow-up: PRN  Everlene OtherJayce Nesiah Jump DO [facility] [place]

## 2016-08-23 NOTE — Patient Instructions (Signed)

## 2016-08-23 NOTE — Progress Notes (Signed)
Pre visit review using our clinic review tool, if applicable. No additional management support is needed unless otherwise documented below in the visit note. 

## 2016-08-31 ENCOUNTER — Telehealth: Payer: Self-pay | Admitting: Family Medicine

## 2016-08-31 NOTE — Telephone Encounter (Signed)
Pt called and wanted to know if Dr. Adriana Simasook would refill her chlorpheniramine-HYDROcodone [facility](TUSSIONEX PENNKINETIC ER) 10-8 MG/5ML SUER. She is completely out of medication and still has the cough (it is not as deep and a little better). Please advise, thank you!  Pharmacy - [facility] on [address] .  Call pt @ [phone]

## 2016-09-01 ENCOUNTER — Other Ambulatory Visit: Payer: Self-pay | Admitting: Family Medicine

## 2016-09-01 MED ORDER — HYDROCOD POLST-CPM POLST ER 10-8 MG/5ML PO SUER: 5.0000 mL | Freq: Two times a day (BID) | ORAL | 0 refills | Status: DC | PRN

## 2016-09-01 NOTE — Telephone Encounter (Signed)
This will slowly improve. I will refill cough med.

## 2016-09-01 NOTE — Progress Notes (Unsigned)
Pt called and told her rx was ready for pick up.

## 2016-09-01 NOTE — Telephone Encounter (Signed)
Reason for call: still coughing  Symptoms:cough worse at night, feels like there is lump in throat, kinda hard to swallow, having to sleep on couch upright to keep from coughing.  No fever  Duration Medications:last dose of Prednisone, Tried Nyquil Severe Cold Flu helped some  Last seen for this problem: Seen by: seen 08/23/16 Please advise

## 2016-09-01 NOTE — Telephone Encounter (Signed)
Left message to call on number listed below

## 2016-09-02 NOTE — Telephone Encounter (Signed)
Patient advised of below and verbalized an understanding  

## 2016-10-26 ENCOUNTER — Encounter: Payer: Self-pay | Admitting: Certified Nurse Midwife

## 2016-10-26 ENCOUNTER — Ambulatory Visit (INDEPENDENT_AMBULATORY_CARE_PROVIDER_SITE_OTHER): Admitting: Certified Nurse Midwife

## 2016-10-26 VITALS — BP 104/64 | HR 112 | Ht 61.0 in | Wt 156.0 lb

## 2016-10-26 DIAGNOSIS — Z3049 Encounter for surveillance of other contraceptives: Secondary | ICD-10-CM

## 2016-10-26 DIAGNOSIS — Z308 Encounter for other contraceptive management: Secondary | ICD-10-CM

## 2016-10-26 MED ORDER — ETONOGESTREL 68 MG ~~LOC~~ IMPL: 68.0000 mg | DRUG_IMPLANT | Freq: Once | SUBCUTANEOUS | Status: AC

## 2016-10-26 NOTE — Progress Notes (Signed)
    Jaymes Graffshley Pellicane is a 31 year old G1 P1 white female who presents for a Nexplanon replacement. Her current Nexplanon is her second Nexplanon which was inserted 07/11/2017 and has now expired. She has had no problems on the Nexplanon and has been amenorrheic. She has gained 12# over the last 3 years. Her last annual was in 2014. Her PCP is DR Adriana Simasook. Her past medical history is significant for anxiety/depression and low back pain/spasms. 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 Current medications include Wellbutrin daily, Xanax prn for anxiety and Robaxin prn. The procedure was explained to the patient and consent was obtained after her questions were answered.    Nexplanon removal Procedure note - The Nexplanon was noted in the patient's left arm and the distal end was identified. The area was cleansed with Hibiclens.  About 0.5 ml of Xylocaine was injected under/ over  the distal end of the implant. An incision was made at the end of the implant. The rod was noted in the incision and grasped with a hemostat. It was removed easily and noted to be intact.    Nexplanon Insertion  The remaining 2 cc of 2% lidocaine was injected through the incision along where the new Nexplanon would be inserted.Marland Kitchen. Nexplanon removed form packaging,  Device confirmed in needle, then inserted full length of needle and withdrawn per handbook instructions. THe Nexplanon was palpated by both the provider and the patient. Pressure was applied over the insertion site for hemostasis then covered with a steri-strip and a bandaid. A curlex was then wrapped around the site. Minimal blood loss.  Pt tolerated the procedure well.   PAtient advised to remove the Curlex wrap in 24 hours and th change bandaids daily x 3-5 days and to keep the area dry. She was given instructions to report persistent bleeding or any signs of infection at the site. RTO in 1-2 months for annual exam.  Farrel Connersolleen Ashauna Bertholf, CNM

## 2016-11-01 ENCOUNTER — Telehealth: Payer: Self-pay

## 2016-11-01 NOTE — Telephone Encounter (Signed)
Pt calling.  She had nexplanon inserted last week.  Hasn't felt good since she got it.  Doesn't know if its's b/c she's sick or side effects of implant.  Nausea, couple of hot flashes, stomach issues.  [phone]

## 2016-11-01 NOTE — Telephone Encounter (Signed)
Pt feels like this is most likely related to stomach issues she had last week. Plans to give it a few more days to see if it is side effects from new nexplanon. Advised fresh device may cause some mild side effects but to RTO if sxs worsen or persist.

## 2016-11-07 ENCOUNTER — Ambulatory Visit (INDEPENDENT_AMBULATORY_CARE_PROVIDER_SITE_OTHER): Admitting: Family Medicine

## 2016-11-07 VITALS — BP 114/78 | HR 74 | Temp 98.2°F | Wt 153.5 lb

## 2016-11-07 DIAGNOSIS — F418 Other specified anxiety disorders: Secondary | ICD-10-CM

## 2016-11-07 DIAGNOSIS — Z91013 Allergy to seafood: Secondary | ICD-10-CM

## 2016-11-07 DIAGNOSIS — F419 Anxiety disorder, unspecified: Principal | ICD-10-CM

## 2016-11-07 DIAGNOSIS — F329 Major depressive disorder, single episode, unspecified: Secondary | ICD-10-CM

## 2016-11-07 MED ORDER — EPINEPHRINE 0.3 MG/0.3ML IJ SOAJ: 0.3000 mg | Freq: Once | INTRAMUSCULAR | 1 refills | Status: AC

## 2016-11-07 NOTE — Assessment & Plan Note (Signed)
New problem (to me). Rx sent for Epi Pen.

## 2016-11-07 NOTE — Patient Instructions (Signed)
Continue your meds.  Follow up annually.  Take care  Dr. Matty Vanroekel  

## 2016-11-07 NOTE — Assessment & Plan Note (Signed)
Stable. Continue Xanax and Wellbutrin.

## 2016-11-07 NOTE — Progress Notes (Signed)
Pre visit review using our clinic review tool, if applicable. No additional management support is needed unless otherwise documented below in the visit note. 

## 2016-11-07 NOTE — Progress Notes (Signed)
Subjective:  Patient ID: Rebecca Davies, female    DOB: 04-24-1986  Age: 31 y.o. MRN: [id number]  CC: Follow up  HPI:  31 year old Female with depression and anxiety presents for follow-up.  Anxiety/depression  Stable on Wellbutrin and PRN Xanax.  Shellfish allergy  Patient reports that she has a severe shellfish allergy.  She states that she feels short of breath when she eats shellfish.  She states that she is going on a trip seen and wants to have an EpiPen on hand in case she inadvertently eats shellfish.  Patient states that she does have improvement with Benadryl.  No reports of rash.  Social Hx   Social History   Social History  . Marital status: Married    Spouse name: N/A  . Number of children: N/A  . Years of education: N/A   Social History Main Topics  . Smoking status: Never Smoker  . Smokeless tobacco: Never Used  . Alcohol use 0.6 oz/week    1 Standard drinks or equivalent per week     Comment: 1 glass of wine every few weeks.   . Drug use: No  . Sexual activity: Yes    Partners: Male     Comment: Husband   Other Topics Concern  . 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   Psychiatric/Behavioral: The patient is nervous/anxious.    Objective:  BP 114/78   Pulse 74   Temp 98.2 F (36.8 C) (Oral)   Wt 153 lb 8 oz (69.6 kg)   SpO2 97%   BMI 29.00 kg/m   BP/Weight 11/07/2016 10/26/2016 08/23/2016  Systolic BP 114 104 123  Diastolic BP 78 64 86  Wt. (Lbs) 153.5 156 158.6  BMI 29 29.48 29.97   Physical Exam  Constitutional: She is oriented to person, place, and time. She appears well-developed. No distress.  Cardiovascular: Normal rate and regular rhythm.   Pulmonary/Chest: Effort normal and breath sounds  normal. She has no wheezes.  Neurological: She is alert and oriented to person, place, and time.  Psychiatric: She has a normal mood and affect.  Vitals reviewed.  Lab Results  Component Value Date   WBC 8.5 09/04/2014   HGB 15.0 09/04/2014   HCT 43.2 09/04/2014   PLT 236.0 09/04/2014   GLUCOSE 89 09/04/2014   CHOL 160 09/04/2014   TRIG 301.0 (H) 09/04/2014   HDL 35.70 (L) 09/04/2014   LDLDIRECT 99.0 09/04/2014   ALT 47 (H) 09/04/2014   AST 26 09/04/2014   NA 138 09/04/2014   K 4.3 09/04/2014   CL 104 09/04/2014   CREATININE 0.79 09/04/2014   BUN 16 09/04/2014   CO2 22 09/04/2014   TSH 2.26 09/04/2014    Assessment & Plan:   Problem List Items Addressed This Visit    Anxiety and depression - Primary    Stable. Continue Xanax and Wellbutrin.      Shellfish allergy    New problem (to me). Rx sent for Epi Pen.        Meds ordered this encounter  Medications  . EPINEPHrine 0.3 mg/0.3 mL IJ SOAJ injection    Sig: Inject 0.3 mLs (0.3 mg total)  into the muscle once.    Dispense:  2 Device    Refill:  1   Follow-up: Annually  Everlene Other DO [facility]

## 2016-11-11 ENCOUNTER — Ambulatory Visit: Admitting: Family Medicine

## 2016-12-28 ENCOUNTER — Ambulatory Visit: Admitting: Certified Nurse Midwife

## 2017-03-04 ENCOUNTER — Emergency Department: Admit: 2017-03-04 | Payer: Medicaid (Managed Care)

## 2017-03-04 ENCOUNTER — Inpatient Hospital Stay
Admission: EM | Admit: 2017-03-04 | Discharge: 2017-03-09 | Disposition: A | Discharge status: Home Health | Payer: Medicaid (Managed Care)

## 2017-03-04 LAB — HEPATIC FUNCTION PANEL
ALT: 20 U/L (ref 7–52)
ALT: 17 U/L (ref 7–52)
AST: 31 U/L (ref 13–39)
AST: 23 U/L (ref 13–39)
Albumin: 3.7 g/dL (ref 3.5–5.7)
Albumin: 3.5 g/dL (ref 3.5–5.7)
Alkaline Phosphatase: 104 U/L (ref 36–125)
Alkaline Phosphatase: 100 U/L (ref 36–125)
Bilirubin, Direct: 0.1 mg/dL (ref 0.0–0.4)
Bilirubin, Direct: 0.2 mg/dL (ref 0.0–0.4)
Bilirubin, Indirect: 0.5 mg/dL (ref 0.0–1.1)
Bilirubin, Indirect: 0.4 mg/dL (ref 0.0–1.1)
Total Bilirubin: 0.6 mg/dL (ref 0.0–1.5)
Total Bilirubin: 0.6 mg/dL (ref 0.0–1.5)
Total Protein: 8.1 g/dL (ref 6.4–8.9)
Total Protein: 7.6 g/dL (ref 6.4–8.9)

## 2017-03-04 LAB — RENAL FUNCTION PANEL W/EGFR
Albumin: 3.7 g/dL (ref 3.5–5.7)
Albumin: 3.5 g/dL (ref 3.5–5.7)
Anion Gap: 11 mmol/L (ref 3–16)
Anion Gap: 10 mmol/L (ref 3–16)
BUN: 10 mg/dL (ref 7–25)
BUN: 10 mg/dL (ref 7–25)
CO2: 24 mmol/L (ref 21–33)
CO2: 24 mmol/L (ref 21–33)
Calcium: 9.5 mg/dL (ref 8.6–10.3)
Calcium: 9.2 mg/dL (ref 8.6–10.3)
Chloride: 103 mmol/L (ref 98–110)
Chloride: 103 mmol/L (ref 98–110)
Creatinine: 0.7 mg/dL (ref 0.60–1.30)
Creatinine: 0.71 mg/dL (ref 0.60–1.30)
Glucose: 112 mg/dL (ref 70–100)
Glucose: 93 mg/dL (ref 70–100)
Osmolality, Calculated: 286 mOsm/kg (ref 278–305)
Osmolality, Calculated: 283 mOsm/kg (ref 278–305)
Phosphorus: 3.8 mg/dL (ref 2.1–4.7)
Phosphorus: 4.2 mg/dL (ref 2.1–4.7)
Potassium: 3.8 mmol/L (ref 3.5–5.3)
Potassium: 3.8 mmol/L (ref 3.5–5.3)
Sodium: 138 mmol/L (ref 133–146)
Sodium: 137 mmol/L (ref 133–146)
eGFR AA CKD-EPI: >90 See note.
eGFR AA CKD-EPI: >90 See note.
eGFR NONAA CKD-EPI: >90 See note.
eGFR NONAA CKD-EPI: >90 See note.

## 2017-03-04 LAB — URINALYSIS, MICROSCOPIC
RBC, UA: 3 /HPF (ref 0–3)
Squam Epithel, UA: 7 /HPF (ref 0–5)
WBC, UA: 6 /HPF (ref 0–5)

## 2017-03-04 LAB — CBC
Hematocrit: 39.3 % (ref 35.0–45.0)
Hemoglobin: 12.9 g/dL (ref 11.7–15.5)
MCH: 26.8 pg (ref 27.0–33.0)
MCHC: 32.8 g/dL (ref 32.0–36.0)
MCV: 81.7 fL (ref 80.0–100.0)
MPV: 7.3 fL (ref 7.5–11.5)
Platelets: 636 10*3/uL (ref 140–400)
RBC: 4.81 10*6/uL (ref 3.80–5.10)
RDW: 14.4 % (ref 11.0–15.0)
WBC: 9.2 10*3/uL (ref 3.8–10.8)

## 2017-03-04 LAB — HCG URINE, QUALITATIVE: Preg Test, Ur: NEGATIVE

## 2017-03-04 LAB — LIPASE: Lipase: 50 U/L (ref 4–82)

## 2017-03-04 LAB — URINALYSIS-MACROSCOPIC W/REFLEX TO MICROSCOPIC
Bilirubin, UA: NEGATIVE
Glucose, UA: NEGATIVE mg/dL
Ketones, UA: NEGATIVE mg/dL
Nitrite, UA: NEGATIVE
Specific Gravity, UA: 1.025 (ref 1.005–1.035)
Urobilinogen, UA: 0.2 EU/dL (ref 0.2–1.0)
pH, UA: 6 (ref 5.0–8.0)

## 2017-03-04 LAB — MAGNESIUM: Magnesium: 2.2 mg/dL (ref 1.5–2.5)

## 2017-03-04 LAB — LACTIC ACID, VENOUS, WHOLE BLOOD, UCMC: Lactate, Ven: 1.4 mmol/L (ref 0.5–2.2)

## 2017-03-04 MED ORDER — ibuprofen (ADVIL,MOTRIN) tablet 600 mg
200 | Freq: Four times a day (QID) | ORAL | Status: AC | PRN
Start: 2017-03-04 — End: 2017-03-07
  Administered 2017-03-05 – 2017-03-07 (×2): 600 mg via ORAL

## 2017-03-04 MED ORDER — lactated Ringers 1,000 mL IV fluid
Freq: Once | INTRAVENOUS | Status: AC
Start: 2017-03-04 — End: 2017-03-04
  Administered 2017-03-04: 23:00:00 1000 mL via INTRAVENOUS

## 2017-03-04 MED ORDER — ondansetron (ZOFRAN) tablet 4 mg
4 | Freq: Four times a day (QID) | ORAL | Status: AC | PRN
Start: 2017-03-04 — End: 2017-03-09

## 2017-03-04 MED ORDER — ondansetron (ZOFRAN) injection 4 mg
4 | Freq: Once | INTRAMUSCULAR | Status: AC
Start: 2017-03-04 — End: 2017-03-04
  Administered 2017-03-04: 21:00:00 4 mg via INTRAVENOUS

## 2017-03-04 MED ORDER — ondansetron (ZOFRAN) injection 8 mg
4 | Freq: Four times a day (QID) | INTRAMUSCULAR | Status: AC | PRN
Start: 2017-03-04 — End: 2017-03-09
  Administered 2017-03-05 – 2017-03-06 (×5): 8 mg via INTRAVENOUS

## 2017-03-04 MED ORDER — ondansetron (ZOFRAN) tablet 8 mg
4 | Freq: Four times a day (QID) | ORAL | Status: AC | PRN
Start: 2017-03-04 — End: 2017-03-09

## 2017-03-04 MED ORDER — oxyCODONE (ROXICODONE) immediate release tablet 5 mg
5 | ORAL | Status: AC | PRN
Start: 2017-03-04 — End: 2017-03-05

## 2017-03-04 MED ORDER — OMNIPAQUE (iohexol) 350 mg iodine/mL 150 mL
350 | Freq: Once | INTRAVENOUS | Status: AC | PRN
Start: 2017-03-04 — End: 2017-03-04
  Administered 2017-03-04: 22:00:00 100 mL via INTRAVENOUS

## 2017-03-04 MED ORDER — ondansetron (ZOFRAN) injection 4 mg
4 | Freq: Four times a day (QID) | INTRAMUSCULAR | Status: AC | PRN
Start: 2017-03-04 — End: 2017-03-09

## 2017-03-04 MED ORDER — oxyCODONE (ROXICODONE) immediate release tablet 10 mg
5 | ORAL | Status: AC | PRN
Start: 2017-03-04 — End: 2017-03-05

## 2017-03-04 MED ORDER — dextrose 5 % and 0.45 % NaCl with KCl 20 mEq infusion 1000 mL
20 | INTRAVENOUS | Status: AC
Start: 2017-03-04 — End: 2017-03-07
  Administered 2017-03-05 (×2): 125 mL/h via INTRAVENOUS
  Administered 2017-03-06 – 2017-03-07 (×3): 100 mL/h via INTRAVENOUS

## 2017-03-04 MED FILL — ONDANSETRON HCL (PF) 4 MG/2 ML INJECTION SOLUTION: 4 4 mg/2 mL | INTRAMUSCULAR | Qty: 4

## 2017-03-04 MED FILL — OMNIPAQUE 350 MG IODINE/ML INTRAVENOUS SOLUTION: 350 350 mg iodine/mL | INTRAVENOUS | Qty: 150

## 2017-03-04 MED FILL — ONDANSETRON HCL (PF) 4 MG/2 ML INJECTION SOLUTION: 4 4 mg/2 mL | INTRAMUSCULAR | Qty: 2

## 2017-03-04 NOTE — ED Notes (Signed)
Pt transported to the floor via hospital bed with belongings

## 2017-03-04 NOTE — Unmapped (Signed)
ED contact:ED Consultation; Acute Care; [phone] ([phone])  Consult performed by: Ephriam Jenkins  Consult ordered by: Glendale Chard        General Surgery History and Physical/Consultation Note    Patient: Diane Stafford  MRN: [id number]  CSN: [id number]    Requesting Physician: Dr Roche    History     CC: Abdominal pain with history of biloma    HPI: Diane Stafford is a 31 y.o. female who presented to the ED with history of Lap Chole (7/9) complicated by ERCP with biliary stent placement for bile leak  (7/11) at OSH w/recurrence of biloma and underwent drain placement by OSH IR on 7/15, treated w/cipro&flagyl for 7 days.    She presents to [facility] today w/5 day history of RUQ pain, bilious emesis, and bilious diarrhea.   Has been unable to take PO due to emesis. States has had increased green output from the percutaneous RUQ drain the past 2 weeks that her home health nurse was concerned about. She reports 400-500cc/day of bilious output from her drain. She has not eaten/kept any food down for over 2 days. Prior she was eating the equivalent of one small meal per day.     Also reports of left upper chest pain that is pleuritic and worse w/leaning forward w/concomitant nonproductive cough and SOB. Denies fevers/chills, black/bloody stools, and no black/bloody emesis.     Labwork in the ED revealed normal lipase, lactate, BMP, hepatic panel, and no leukocytosis. EKG w/o acute ischemia. CT abdomen/pelvis showed multiple intra-abdominal abscesses and left pleural effusion. ACS consulted for management of postsurgical complication and to evaluate need for intervention      PMH:  Past Medical History:   Diagnosis Date   ??? Menarche     age 44       PSH:  Past Surgical History:   Procedure Laterality Date   ??? CESAREAN SECTION      x 4   ??? CHOLECYSTECTOMY     ??? WISDOM TOOTH EXTRACTION  2010    two   ERCP on 7/11 with biliary stent placement    Medications:  No current facility-administered medications on file prior to  encounter.      Current Outpatient Prescriptions on File Prior to Encounter   Medication Sig Dispense Refill   ??? ALBUTEROL SULFATE (PROVENTIL HFA INHL) as needed.     ??? amoxicillin-clavulanate (AUGMENTIN) 875-125 mg per tablet Take 1 tablet by mouth every 12 hours. Til all medicine is taken antibiotic for infection      ??? famotidine (PEPCID) 20 MG tablet 20 mg 2 times a day.     ??? miscellaneous medical supply Misc fluzone preservative free susp      ??? PRENATAL VIT/IRON FUMARATE/FA (PRENATAL ORAL)      ??? sertraline (ZOLOFT) 50 MG tablet Take 50 mg by mouth daily.         Allergies:  Patient has no known allergies.    SH:  Social History     Social History   ??? Marital status: Single     Spouse name: N/A   ??? Number of children: N/A   ??? Years of education: N/A     Occupational History   ??? Not on file.     Social History Main Topics   ??? Smoking status: Former Smoker   ??? Smokeless tobacco: Never Used      Comment: 11-24-2009   ??? Alcohol use Yes      Comment: occ   ???  Drug use: No      Comment: 11-24-2009   ??? Sexual activity: Not on file     Other Topics Concern   ??? Not on file     Social History Narrative   ??? No narrative on file   'Quit' smoking 3 weeks ago; 1/2ppd history  History of marijuana use in remote past  Drinks rarely on social ocassions    FH:   Family History   Problem Relation Age of Onset   ??? Mental illness Other      aunt-psych issues   ??? Other Other      2 uncles had open heart surgery   ??? Hypertension Other      two uncles   ??? Heart disease Maternal Grandmother      aorta valve problem   ??? Hypertension Maternal Grandmother    ??? Alzheimer's disease Maternal Grandmother    ??? Aneurysm Maternal Grandfather      brain   ??? Hypertension Maternal Grandfather        ROS:   Review of Systems   Constitutional: Negative for chills and fever.   HENT: Negative for trouble swallowing.    Respiratory: Positive for cough and shortness of breath.    Cardiovascular: Positive for chest pain. Negative for palpitations.    Gastrointestinal: Positive for abdominal pain, diarrhea, nausea and vomiting. Negative for blood in stool and constipation.   Genitourinary: Negative for dysuria and hematuria.   Musculoskeletal: Negative for arthralgias and myalgias.   Skin: Negative for color change and rash.   Neurological: Negative for weakness and light-headedness.       Vital Signs     Temp:  [98.7 ??F (37.1 ??C)] 98.7 ??F (37.1 ??C)  Heart Rate:  [85-114] 85  Resp:  [16-20] 20  BP: (116-126)/(84-91) 116/84  Vitals:    03/04/17 1851   BP: 116/84   Pulse: 85   Resp: 20   Temp:    SpO2: 99%          Physical Exam     Physical Exam   Constitutional: She is oriented to person, place, and time. She appears well-developed and well-nourished.   HENT:   Head: Normocephalic and atraumatic.   Right Ear: External ear normal.   Left Ear: External ear normal.   Eyes: No scleral icterus.   Cardiovascular: Normal rate, regular rhythm and normal heart sounds.  Exam reveals no gallop and no friction rub.    No murmur heard.  Pulmonary/Chest: Effort normal. No stridor.   Breath sounds diminished bilaterally at bases. Crackles to the LL lung field.   Abdominal:   Soft, nondistended. Diffuse tender to palpation, worse in the epigastric region. No rebound or guarding. No masses. RUQ percutaneous drain w/bilious fluid.    Musculoskeletal: Normal range of motion. She exhibits no edema.   Neurological: She is alert and oriented to person, place, and time.   Skin: Skin is warm and dry. She is not diaphoretic.       Laboratory Data           Invalid input(s): CO2ART, HBO2PE      Lab 03/04/17  1624   WBC 9.2   HEMOGLOBIN 12.9   HEMATOCRIT 39.3   MEAN CORPUSCULAR VOLUME 81.7   PLATELETS 636*         Lab 03/04/17  1823 03/04/17  1624   SODIUM 137 138   POTASSIUM 3.8 3.8   CHLORIDE 103 103   CO2 24 24  BUN 10 10   CREATININE 0.71 0.70   GLUCOSE 93 112*   CALCIUM 9.2 9.5   MAGNESIUM  --  2.2   PHOSPHORUS 4.2 3.8             Lab 03/04/17  1823 03/04/17  1624   ALT 17 20   AST  23 31   ALK PHOS 100 104   BILIRUBIN TOTAL 0.6 0.6   BILIRUBIN DIRECT 0.2 0.1   ALBUMIN 3.5   3.5 3.7   3.7         Lab 03/04/17  1630   COLOR, URINE Yellow   CLARITY Clear   PROTEIN UA Trace*   PH UA 6.0   SPECIFIC GRAVITY, URINE 1.025   GLUCOSE UA Negative   BLOOD UA Trace-lysed*   LEUKOCYTES UA Trace*   NITRITE UA Negative   BILIRUBIN UA Negative   UROBILINOGEN UA 0.2 E.U./dL   RBC UA 3   WBC UA 6*   BACTERIA Few*     Other labs:  Lab Results   Component Value Date    ALKPHOS 100 03/04/2017     Lab Results   Component Value Date    LIPASE 50 03/04/2017         Imaging Studies     Ct Abdomen And Pelvis With Iv Contrast    Result Date: 03/04/2017  CT ABDOMEN AND PELVIS WITH IV CONTRAST on 03/04/2017 5:25 PM EDT  Indication: Abdominal pain. Comparison: CT abdomen/pelvis with contrast dated March 13, 2008. Technique:  Helically acquired CT images were obtained from the lung bases through the pelvis following administration of oral and 100 mL Omnipaque-350 intravenous contrast and reconstructed to a slice thickness of 5 mm with a field-of-view of 36 cm.  Findings: Limited evaluation of the lower chest demonstrates a small left pleural effusion with adjacent atelectasis. There is a pigtail catheter at the inferior aspect of the liver without a significant surrounding fluid collection. There is a small amount of pneumobilia within the left hepatic lobe, likely postsurgical as there is a metallic density in the common bile duct descending into the duodenum, likely a stent. The liver is otherwise normal in appearance. The gallbladder is surgically absent with post cholecystectomy clips in the gallbladder fossa. The pancreas appears normal. There are small enhancing fluid collections at the anterior superior aspects of the spleen. The larger fluid collection at the superior aspect of the spleen measures approximately 5.8 x 2.0 cm in size (series 305, image 17). The adrenal glands are normal in appearance. The kidneys enhance  symmetrically. There is no evidence of hydronephrosis or hydroureter. The urinary bladder is underdistended, limiting evaluation. The small and large bowel are normal in caliber without focal bowel wall thickening. There is no evidence of obstruction. The appendix is visualized and appears normal. There are scattered fat stranding throughout the mesentery, most significant around the greater curvature of the stomach. There is a small fat-containing ventral hernia. There are multiple rim-enhancing fluid collections throughout the abdomen and pelvis for example in the left upper quadrant anterior to the left kidney measuring approximately 5.3 x 3.6 cm in greatest axial dimension (series 305, image 33). Additional rim-enhancing fluid collections are seen inferior to the stomach with adjacent fat stranding measuring approximately 4 x 2.8 cm (series 305, image 32), and in the pelvis posterior to the uterus measuring approximately 3.6 x 1.7 cm in greatest axial dimension (series 305, image 82). No significant free fluid is seen throughout the abdomen or  pelvis. No suspicious lymphadenopathy. The visualized vasculature structures appear normal. Bone windows demonstrate no suspicious osteolytic or osteoblastic lesions.     IMPRESSION: 1.  Multiple rim-enhancing lesions throughout the abdomen and pelvis concerning for multiple intra-abdominal abscesses. 2.  Pigtail catheter tip terminates at the inferior aspect of the liver without significant fluid surrounding the catheter tip. 3.  Small amount of pneumobilia within the left hepatic lobe, which is nonspecific but likely postsurgical in the setting of a common bile duct stent. 4.  Small fat-containing ventral hernia. 5.  Small left pleural effusion. Approved by Jodean Lima on 03/04/2017 6:05 PM EDT I have personally reviewed the images and I agree with this report. Report Verified by: Essie Hart, MD at 03/04/2017 6:11 PM EDT    Ct Pulmonary Angiography    Result Date:  03/04/2017  EXAM: CT PULMONARY ANGIOGRAPHY dated 03/04/2017. CLINICAL HISTORY: Shortness of breath. COMPARISON: CT abdomen/pelvis from 03/13/2008. TECHNIQUE: Multidetector CT imaging was performed through the lungs in the supine position following the administration of intravenous 100 ml Omnipaque-350 intravenous contrast according to the CTPA protocol.  The images were reconstructed with 5 and 1 mm collimation using a 36 cm field of view. Axial MIP images were also reconstructed at a separate workstation. FINDINGS: HEART: Unremarkable.  VASCULAR STRUCTURES: This is an adequate study for the evaluation of pulmonary embolism. No pulmonary emboli are noted up to the proximal segmental level. Evaluation in the left lower lobe is limited due to pleural effusion. MEDIASTINUM AND HILA: No lymphadenopathy. CHEST WALL: Unremarkable. AIRWAYS & LUNGS: The central airways are patent with scattered areas of mucus plugging in the left lower lobe. There is a moderate-sized left pleural effusion with associated consolidation. MUSCULOSKELETAL: Normal. Abdominal findings reported separately.     IMPRESSION: 1.  No evidence for pulmonary embolism. 2.  Moderate left pleural effusion with associated consolidation, likely atelectasis. Approved by Shelda Jakes, MD on 03/04/2017 5:52 PM EDT I have personally reviewed the images and I agree with this report. Report Verified by: Osvaldo Shipper, MD at 03/04/2017 6:02 PM EDT      Assessment and Plan   Yzabella Crunk is a 31 y.o. female with history of laparoscopic cholecystectomy complicated by bile leak leading to ERCP with stent placement and subsequent biloma formation requiring IR percutaneous drainage. She presents to [facility] with abdominal, nausea, vomiting, and inability to tolerate PO intake. CT Scan demonstrates multiple rim-enhancing fluid collections and fat stranding w/left pleural effusion, concerning for intra-abdominal abscesses w/possible reactive pleural effusion vs atelectasis. One of  these collections is compressing the stomach posteriorly; this collection was reportedly present on OSH CT from 7/14 as well as collection between spleen and pancreas. Pelvic collection likely new as not reported on prior OSH CT report.     She will be admitted to ACS for continued monitoring and evaluation.      S/P Lap Chole complicated by leak from common bile duct, ERCP with biliary stent, biloma with perc drain with multiple intra-abdominal Abscesses/Fluid Collections   - Admit to ACS  - IV zosyn/flagyl. Consult pharmacy for recs  - NPO, IV LR 100cc/hr, plan for TPN tomorrow   - Strict I/Os  - Consult GI for ERCP due to copious output of bile - concern that stent is not in appropriate position and may have migrated.   Obtain OSH Images of ERCP and CT A/P for planning of interventions and comparison    Pleuritic Chest pain  CTPA negative for PE and EKG negative for acute  ischemia.   Chest pain is pleuritic with no tachycardia or hypoxia, likely secondary to left pleural effusion.    Will continue to monitor      Ephriam Jenkins, MD  [facility]  Acute Care Surgery  Pager: [phone]

## 2017-03-04 NOTE — ED Triage Notes (Signed)
Had gallbladder removed July 9th. Went back to [facility] on the 14-22nd. Weas given IV cipro and flagyl. Afebrile. When she takes a deep breath she becomes more SOB. Was drain that is putting out more bile

## 2017-03-04 NOTE — Unmapped (Signed)
[facility] ED Note    Diane Stafford is a 31 y.o. female patient who presented to the Emergency Department. This patient was initially seen by an off-going provider. Please see that provider's note for details regarding the initial history, physical exam and ED course.    Care of this patient was signed out to me.    ED Course and MDM     Diane Stafford is a 31 y.o. female with history of recent laparoscopic cholecystectomy  who presented to the emergency department with Abdominal Pain and Post-op Problem  .  The patient was evaluated by myself and the ED Attending Physician.  A thorough history and physical was performed.    My physical exam was notable for intact drain, with clean and dry entrance of drain into skin, copious bilious discharge.    At this time, the following is pending:   - repeat BMP and LFTs due to hemolysis of initial draw  - CT and CTPA    Further MDM: (please see off going provider's note)  ED Course as of Mar 04 2204   Sat Mar 04, 2017   1814 6:14 PM  Received sign out from Dr. Raleigh Callas.  Patient reassessed.  Will f/u CT imaging and repeated labs.  [BB]   1845 Alkaline Phosphatase: 100 [BB]   1846 6:46 PM    [BB]   1856 Repeat LFT and BMP unremarkable.  CTPA without PE.  Abd CT suggests multiple intra-abdominal abscesses. Will consult surgery.    [BB]   1908 Acute Care Surgery consulted.  Will assess patient.    [BB]   2120 Per acute care surgery note, patient to be admitted to acute care surgery for management.  [BB]      ED Course User Index  [BB] Glendale Chard, MD       CT Abdomen and Pelvis With IV contrast   Final Result   IMPRESSION:   1.  Multiple rim-enhancing lesions throughout the abdomen and pelvis concerning for multiple intra-abdominal abscesses.   2.  Pigtail catheter tip terminates at the inferior aspect of the liver without significant fluid surrounding the catheter tip.   3.  Small amount of pneumobilia within the left hepatic lobe, which is nonspecific but likely  postsurgical in the setting of a common bile duct stent.   4.  Small fat-containing ventral hernia.   5.  Small left pleural effusion.      Approved by Jodean Lima on 03/04/2017 6:05 PM EDT      I have personally reviewed the images and I agree with this report.      Report Verified by: Essie Hart, MD at 03/04/2017 6:11 PM EDT      CT Pulmonary Angiography   Final Result   IMPRESSION:      1.  No evidence for pulmonary embolism.   2.  Moderate left pleural effusion with associated consolidation, likely atelectasis.      Approved by Shelda Jakes, MD on 03/04/2017 5:52 PM EDT      I have personally reviewed the images and I agree with this report.      Report Verified by: Osvaldo Shipper, MD at 03/04/2017 6:02 PM EDT            -Patient admitted to acute care surgery service.    Summary of Treatment in ED:  Medications   dextrose 5 % and 0.45 % NaCl with KCl 20 mEq infusion 1000 mL (125 mL/hr Intravenous New Bag  03/04/17 2156)   ibuprofen (ADVIL,MOTRIN) tablet 600 mg (not administered)   ondansetron (ZOFRAN) tablet 4 mg ( Oral See Alternative 03/04/17 2134)     Or   ondansetron (ZOFRAN) tablet 8 mg ( Oral See Alternative 03/04/17 2134)     Or   ondansetron (ZOFRAN) injection 4 mg ( Intravenous See Alternative 03/04/17 2134)     Or   ondansetron (ZOFRAN) injection 8 mg (8 mg Intravenous Given 03/04/17 2134)   oxyCODONE (ROXICODONE) immediate release tablet 5 mg (not administered)     Or   oxyCODONE (ROXICODONE) immediate release tablet 10 mg (not administered)   ondansetron (ZOFRAN) injection 4 mg (4 mg Intravenous Given 03/04/17 1718)   OMNIPAQUE (iohexol) 350 mg iodine/mL 150 mL (100 mLs Intravenous Given 03/04/17 1732)   lactated Ringers 1,000 mL IV fluid (0 mLs Intravenous Stopped 03/04/17 2155)     Procedures    None    Impression     1. Postoperative intra-abdominal abscess, sequela (CMS Dx)             Plan     Patient to be admitted to acute care surgery service for further workup and management of presumed  postoperative surgical complications following laparoscopic cholecystectomy, ERCP, and biloma drain, with new finding of multiple likely intra-abdominal abscesses on CT.

## 2017-03-04 NOTE — Unmapped (Signed)
[place] ED Note    Date of service:  03/04/2017    Reason for Visit: Abdominal Pain and Post-op Problem      Patient History     HPI    This is a 31 yo female with no significant PMH who presents for abdominal pain.     Patient had lap chole on 7/9 complicated by post-op bile leak. She underwent ERCP and stent placement on 7/11, was discharged on 7/13. Readmitted 7/14 for post-op pain, CT abd showed biloma. Percutaneous drain placed by IR, treated with cipro and flagyl, transitioned to PO and discharged home with PO antibiotics and drain in place with PCP and surgery follow up. Also recommended to get CBC, LFT, BMP in 1 week after discharge, discharged on 7/22.      Upon presentation to the ED, patient was tachycardic but otherwise HDS. Patient endorsed bilious, nonbloody emesis and diarrhea since discharge from last hospitalization. Patient has been taking her antibiotics as prescribed but unsure how much is being absorbed given her N/V. Patient states her abdominal pain is 8/10, diffuse throughout abdomen but worse in the epigastric area. Patient states she has been having dyspnea with deep inspiration but also on exertion since discharge from OSH but states she has tried to remain physically active.  Also reports cough with clear sputum with deep inspiration, also reports chest tightness. Patient also has noticed BRBPR when she wipes, no hx of hemorrhoids, has also noticed small amount of bloody discharge in percutaneous drainage. Currently denies chest pain, dyspnea.    Past Medical History:   Diagnosis Date   ??? Menarche     age 50       Past Surgical History:   Procedure Laterality Date   ??? CESAREAN SECTION      x 4   ??? CHOLECYSTECTOMY     ??? WISDOM TOOTH EXTRACTION  2010    two       Patient  reports that she has quit smoking. She has never used smokeless tobacco. She reports that she drinks alcohol. She reports that she does not use drugs.      Previous  Medications    ALBUTEROL SULFATE (PROVENTIL HFA INHL)    as needed.    AMOXICILLIN-CLAVULANATE (AUGMENTIN) 875-125 MG PER TABLET    Take 1 tablet by mouth every 12 hours. Til all medicine is taken antibiotic for infection     CIPROFLOXACIN HCL (CIPRO) 500 MG TABLET    Take 500 mg by mouth 2 times a day.    FAMOTIDINE (PEPCID) 20 MG TABLET    20 mg 2 times a day.    METRONIDAZOLE (FLAGYL ORAL)    Take by mouth.    MISCELLANEOUS MEDICAL SUPPLY MISC    fluzone preservative free susp     PRENATAL VIT/IRON FUMARATE/FA (PRENATAL ORAL)        SERTRALINE (ZOLOFT) 50 MG TABLET    Take 50 mg by mouth daily.       Allergies:   Allergies as of 03/04/2017   ??? (No Known Allergies)       Review of Systems     Review of Systems   Constitutional: Negative for chills and fever.   HENT: Negative for hearing loss and trouble swallowing.    Eyes: Negative for visual disturbance.   Respiratory: Positive for cough, chest tightness and shortness of breath.    Cardiovascular: Positive for chest pain.   Gastrointestinal: Positive for abdominal pain, blood in stool, nausea and vomiting.  Genitourinary: Negative for dysuria, frequency and hematuria.   Neurological: Negative for dizziness, syncope, weakness, light-headedness and numbness.     Physical Exam     ED Triage Vitals   Vital Signs Group      Temp       Temp src       Pulse       Heart Rate Source       Resp       SpO2       BP       MAP (mmHg)       BP Location       BP Method       Patient Position    SpO2    O2 Device        ED Physical Exam  General appearance: alert, appears stated age, cooperative and no distress  Head: Normocephalic, without obvious abnormality, atraumatic  Eyes: conjunctivae/corneas clear. Anicteric sclerae, EOM grossly intact  Throat: Nonerythematous, nonexudative pharynx.  Back: symmetric, no curvature. ROM normal. ??  Lungs: clear to auscultation bilaterally and no respiratory distress  Heart: tachycardic, S1, S2 normal, no murmur, click, rub or  gallop  Abdomen: Soft, ND. + BS. TTP diffusely throughout abdomen but increased TTP in epigastric region. No rebound or guarding. Percutaneous drain in place, site of insertion clean and dry. Percutaneous drain with biliary drainage.  Extremities: extremities normal, atraumatic, no cyanosis or edema  Skin: Skin color, texture, turgor normal. No rashes or lesions  Neurologic: CN 2-12 grossly intact. Moves all four extremities.    Diagnostic Studies     Labs:    Please see electronic medical record for any tests performed in the ED    Radiology:    Please see electronic medical record for any tests performed in the ED    EKG:    NSR, no acute ischemic changes, no prior available    Emergency Department Procedures     Procedures    ED Course and MDM     Diane Stafford is a 32 y.o. female who presented to the emergency department with Abdominal Pain and Post-op Problem    Patient presents with post-op abdominal pain. CBC unremarkable, hepatic and renal panel hemolyzed so repeats were ordered. Lipase, lactic acid, magnesium level wnl. UA with small leukocytes but denies UTI sx. Urine pregnancy negative. CT abd/pelvis ordered to assess for post-op complications.  CTPA ordered to r/o PE etiology for dyspnea, tachycardia. EKG done with NSR and no acute ischemic changes. Rectal exam negative for BRBPR or melena.    At this time I will be going off of service and will sign out the care of this patient to my colleague Dr. Luster Landsberg. My colleague's responsibilities will include:    - Follow up repeat hepatic and renal panel  - Follow up with CTPA and CT abd/pelvis results  - Consult general surgery and follow up recs  - Dispo    Gwendolyn Grant, MD  Internal Medicine PGY-3  03/04/2017    Critical Care Time (Attendings)          Gwendolyn Grant, MD  Resident  03/04/17 [phone]

## 2017-03-04 NOTE — Unmapped (Signed)
Ready and clean bed assigned to [id number]. Pt updated on plan of care including transfer and is agreeable. Receiving RN may call [phone] to consult ED RN with questions regarding patients care. Pt is leaving the department in stable condition with all personal items in possession.   The patient does not have a patient monitor at bedside in the ED. Pt alert and oriented x4, pleasant and cooperative with care up ad-lib  Most recent vitals: BP 128/86 (BP Location: Right arm, Patient Position: Sitting)    Pulse 100    Temp 98.2 ??F (36.8 ??C) (Oral)    Resp 27    LMP 02/14/2017 (Exact Date)    SpO2 100%     Azriel Jakob R GALINDO?? ,RN

## 2017-03-04 NOTE — Unmapped (Signed)
Pt to CT via wheelchair

## 2017-03-04 NOTE — Unmapped (Signed)
ED Attending Attestation Note    Date of service:  03/04/2017    This patient was seen by the resident physician.  I have seen and examined the patient, agree with the workup, evaluation, management and diagnosis. The care plan has been discussed and I concur.  I have reviewed the ECG and concur with the resident's interpretation.    My assessment reveals a 31 y.o. female somewhat worsening abdominal pain since percutaneous drain for biloma at outside hospital..

## 2017-03-05 ENCOUNTER — Inpatient Hospital Stay: Admit: 2017-03-05 | Payer: Medicaid (Managed Care)

## 2017-03-05 LAB — BASIC METABOLIC PANEL
Anion Gap: 8 mmol/L (ref 3–16)
BUN: 8 mg/dL (ref 7–25)
CO2: 24 mmol/L (ref 21–33)
Calcium: 9 mg/dL (ref 8.6–10.3)
Chloride: 107 mmol/L (ref 98–110)
Creatinine: 0.74 mg/dL (ref 0.60–1.30)
Glucose: 119 mg/dL (ref 70–100)
Osmolality, Calculated: 287 mOsm/kg (ref 278–305)
Potassium: 3.8 mmol/L (ref 3.5–5.3)
Sodium: 139 mmol/L (ref 133–146)
eGFR AA CKD-EPI: >90 See note.
eGFR NONAA CKD-EPI: >90 See note.

## 2017-03-05 LAB — PREALBUMIN: Prealbumin: 23 mg/dL (ref 17.0–34.0)

## 2017-03-05 LAB — CBC
Hematocrit: 35.1 % (ref 35.0–45.0)
Hemoglobin: 11.4 g/dL (ref 11.7–15.5)
MCH: 26.8 pg (ref 27.0–33.0)
MCHC: 32.5 g/dL (ref 32.0–36.0)
MCV: 82.3 fL (ref 80.0–100.0)
MPV: 6.9 fL (ref 7.5–11.5)
Platelets: 455 10*3/uL (ref 140–400)
RBC: 4.27 10*6/uL (ref 3.80–5.10)
RDW: 14.3 % (ref 11.0–15.0)
WBC: 7.8 10*3/uL (ref 3.8–10.8)

## 2017-03-05 LAB — C-REACTIVE PROTEIN: CRP: 12.8 mg/L (ref 1.0–10.0)

## 2017-03-05 LAB — MAGNESIUM: Magnesium: 2.1 mg/dL (ref 1.5–2.5)

## 2017-03-05 LAB — PHOSPHORUS: Phosphorus: 4.3 mg/dL (ref 2.1–4.7)

## 2017-03-05 LAB — TRANSFERRIN: Transferrin: 201 mg/dL (ref 203–362)

## 2017-03-05 MED ORDER — HYDROmorphone (DILAUDID) injection Syrg 0.5 mg
0.5 | INTRAMUSCULAR | Status: AC | PRN
Start: 2017-03-05 — End: 2017-03-07
  Administered 2017-03-05 – 2017-03-06 (×6): 0.5 mg via INTRAVENOUS

## 2017-03-05 MED ORDER — lactated Ringers infusion
INTRAVENOUS | Status: AC | PRN
Start: 2017-03-05 — End: 2017-03-05
  Administered 2017-03-05: 21:00:00 via INTRAVENOUS

## 2017-03-05 MED ORDER — fentaNYL (SUBLIMAZE) injection
50 | INTRAMUSCULAR | Status: AC | PRN
Start: 2017-03-05 — End: 2017-03-05
  Administered 2017-03-05: 21:00:00 100 via INTRAVENOUS

## 2017-03-05 MED ORDER — sterile water (PF) Soln
INTRAMUSCULAR | Status: AC
Start: 2017-03-05 — End: 2017-03-06

## 2017-03-05 MED ORDER — fentaNYL (SUBLIMAZE) injection 25 mcg
50 | INTRAMUSCULAR | Status: AC | PRN
Start: 2017-03-05 — End: 2017-03-05

## 2017-03-05 MED ORDER — fentaNYL (SUBLIMAZE) injection 50 mcg
50 | INTRAMUSCULAR | Status: AC | PRN
Start: 2017-03-05 — End: 2017-03-05

## 2017-03-05 MED ORDER — succinylcholine (QUELICIN) injection
20 | INTRAMUSCULAR | Status: AC | PRN
Start: 2017-03-05 — End: 2017-03-05
  Administered 2017-03-05: 21:00:00 100 via INTRAVENOUS

## 2017-03-05 MED ORDER — HYDROmorphone (DILAUDID) injection Syrg 1 mg
0.5 | INTRAMUSCULAR | Status: AC | PRN
Start: 2017-03-05 — End: 2017-03-07
  Administered 2017-03-06 – 2017-03-07 (×5): 1 mg via INTRAVENOUS

## 2017-03-05 MED ORDER — lidocaine (PF) 20 mg/mL (2 %) Soln
20 | INTRAVENOUS | Status: AC | PRN
Start: 2017-03-05 — End: 2017-03-05
  Administered 2017-03-05: 21:00:00 100 via INTRAVENOUS

## 2017-03-05 MED ORDER — morPHINE injection 4 mg
4 | Freq: Once | INTRAVENOUS | Status: AC
Start: 2017-03-05 — End: 2017-03-05

## 2017-03-05 MED ORDER — HYDROmorphone (DILAUDID) 0.5 mg/0.5 mL injection Syrg
0.5 | INTRAMUSCULAR | Status: AC
Start: 2017-03-05 — End: 2017-03-05

## 2017-03-05 MED ORDER — acetaminophen (OFIRMEV) Soln 1,000 mg
1000 | Freq: Once | INTRAVENOUS | Status: AC
Start: 2017-03-05 — End: 2017-03-05
  Administered 2017-03-06: 03:00:00 1000 mg via INTRAVENOUS

## 2017-03-05 MED ORDER — lactated Ringers infusion
INTRAVENOUS | Status: AC
Start: 2017-03-05 — End: 2017-03-05

## 2017-03-05 MED ORDER — dexamethasone (DECADRON) injection
4 | INTRAMUSCULAR | Status: AC | PRN
Start: 2017-03-05 — End: 2017-03-05
  Administered 2017-03-05: 21:00:00 4 via INTRAVENOUS

## 2017-03-05 MED ORDER — ondansetron (ZOFRAN) injection
4 | INTRAMUSCULAR | Status: AC | PRN
Start: 2017-03-05 — End: 2017-03-05
  Administered 2017-03-05: 22:00:00 4 via INTRAVENOUS

## 2017-03-05 MED ORDER — naloxone (NARCAN) injection 0.04 mg
0.4 | INTRAMUSCULAR | Status: AC | PRN
Start: 2017-03-05 — End: 2017-03-05

## 2017-03-05 MED ORDER — HYDROmorphone (DILAUDID) 0.5 mg/0.5 mL injection Syrg
0.5 | INTRAMUSCULAR | Status: AC
Start: 2017-03-05 — End: 2017-03-06

## 2017-03-05 MED ORDER — indomethacin (INDOCIN) 50 mg suppository
50 | RECTAL | Status: AC
Start: 2017-03-05 — End: 2017-03-05
  Administered 2017-03-05: 23:00:00 via RECTAL

## 2017-03-05 MED ORDER — fentaNYL (SUBLIMAZE) 50 mcg/mL injection
50 | INTRAMUSCULAR | Status: AC
Start: 2017-03-05 — End: 2017-03-06

## 2017-03-05 MED ORDER — fentaNYL (SUBLIMAZE) injection 12.5 mcg
50 | INTRAMUSCULAR | Status: AC | PRN
Start: 2017-03-05 — End: 2017-03-05

## 2017-03-05 MED ORDER — indomethacin (INDOCIN) 50 mg suppository
50 | RECTAL | Status: AC | PRN
Start: 2017-03-05 — End: 2017-03-05
  Administered 2017-03-05: 22:00:00 15 via RECTAL

## 2017-03-05 MED ORDER — glucagon HCl 1 mg injection
1 | INTRAMUSCULAR | Status: AC
Start: 2017-03-05 — End: 2017-03-06

## 2017-03-05 MED ORDER — proMETHazine (PHENERGAN) injection 12.5 mg
25 | Freq: Four times a day (QID) | INTRAMUSCULAR | Status: AC | PRN
Start: 2017-03-05 — End: 2017-03-09
  Administered 2017-03-05 – 2017-03-06 (×3): 12.5 mg via INTRAVENOUS

## 2017-03-05 MED ORDER — indomethacin (INDOCIN) 50 mg suppository 100 mg
50 | Freq: Once | RECTAL | Status: AC
Start: 2017-03-05 — End: 2017-03-05

## 2017-03-05 MED ORDER — piperacillin-tazobactam (ZOSYN) in dextrose, iso-osm 50 mL IVPB 3.375 g
3.375 | Freq: Three times a day (TID) | INTRAVENOUS | Status: AC
Start: 2017-03-05 — End: 2017-03-07
  Administered 2017-03-05 – 2017-03-07 (×6): 3.375 g via INTRAVENOUS

## 2017-03-05 MED ORDER — propofol 10 mg/ml (DIPRIVAN) injection
10 | INTRAVENOUS | Status: AC | PRN
Start: 2017-03-05 — End: 2017-03-05
  Administered 2017-03-05: 21:00:00 200 via INTRAVENOUS

## 2017-03-05 MED ORDER — fentaNYL (SUBLIMAZE) injection 6.5 mcg
50 | INTRAMUSCULAR | Status: AC | PRN
Start: 2017-03-05 — End: 2017-03-05

## 2017-03-05 MED ORDER — HYDROmorphone (DILAUDID) injection 2 mg
2 | Freq: Once | INTRAMUSCULAR | Status: AC
Start: 2017-03-05 — End: 2017-03-05
  Administered 2017-03-05: 23:00:00 2 mg via INTRAVENOUS

## 2017-03-05 MED FILL — HYDROMORPHONE 0.5 MG/0.5 ML INJECTION SYRINGE: 0.5 0.5 mg/0.5 mL | INTRAMUSCULAR | Qty: 2

## 2017-03-05 MED FILL — OFIRMEV 1,000 MG/100 ML (10 MG/ML) INTRAVENOUS SOLUTION: 1000 1,000 mg/100 mL (10 mg/mL) | INTRAVENOUS | Qty: 100

## 2017-03-05 MED FILL — DILAUDID (PF) 0.5 MG/0.5 ML INJECTION SYRINGE: 0.5 0.5 mg/0.5 mL | INTRAMUSCULAR | Qty: 0.5

## 2017-03-05 MED FILL — ONDANSETRON HCL (PF) 4 MG/2 ML INJECTION SOLUTION: 4 4 mg/2 mL | INTRAMUSCULAR | Qty: 4

## 2017-03-05 MED FILL — ONDANSETRON HCL 8 MG TABLET: 8 8 MG | ORAL | Qty: 1

## 2017-03-05 MED FILL — ZOSYN 3.375 GRAM/50 ML IN DEXTROSE (ISO-OSMOTIC) INTRAVENOUS PIGGYBACK: 3.375 3.375 gram/50 mL | INTRAVENOUS | Qty: 50

## 2017-03-05 MED FILL — DILAUDID (PF) 0.5 MG/0.5 ML INJECTION SYRINGE: 0.5 0.5 mg/0.5 mL | INTRAMUSCULAR | Qty: 1

## 2017-03-05 MED FILL — PROMETHAZINE 25 MG/ML INJECTION SOLUTION: 25 25 mg/mL | INTRAMUSCULAR | Qty: 1

## 2017-03-05 MED FILL — IBUPROFEN 200 MG TABLET: 200 200 MG | ORAL | Qty: 3

## 2017-03-05 MED FILL — GLUCAGON HCL 1 MG/ML SOLUTION FOR INJECTION: 1 1 mg/mL | INTRAMUSCULAR | Qty: 2

## 2017-03-05 MED FILL — ONDANSETRON HCL 4 MG TABLET: 4 4 MG | ORAL | Qty: 1

## 2017-03-05 MED FILL — WATER FOR INJECTION, STERILE INJECTION SOLUTION: INTRAMUSCULAR | Qty: 20

## 2017-03-05 MED FILL — FENTANYL (PF) 50 MCG/ML INJECTION SOLUTION: 50 50 mcg/mL | INTRAMUSCULAR | Qty: 2

## 2017-03-05 MED FILL — INDOCIN 50 MG RECTAL SUPPOSITORY: 50 50 mg | RECTAL | Qty: 2

## 2017-03-05 NOTE — Unmapped (Signed)
Reassessed. BP 149/89 . Will continue to monitor.[email or web address]

## 2017-03-05 NOTE — Unmapped (Signed)
Brief GI update note:    - After discussion with [name], tentative plan is to have pt added on today for ERCP and evaluation of possible bile leak  - continue NPO status  - Continue antibiotics  - Further recommendations post-procedure    Royetta Asal, MD, MS  Gastroenterology Fellow  Pager: [phone]

## 2017-03-05 NOTE — Unmapped (Signed)
Anesthesia Post Note    Patient: Diane Stafford    Procedure(s) Performed: Procedure(s):  ERCP    Anesthesia type: general endotracheal    Patient location: PACU    Post pain: Adequate analgesia    Post assessment: no apparent anesthetic complications    Last Vitals:   Vitals:    03/05/17 1915 03/05/17 1930 03/05/17 2005 03/05/17 2225   BP: (!) 156/106 (!) 165/103 (!) 153/108 149/89   BP Location:   Right arm    Patient Position:   Lying    Pulse: 93 91 93 59   Resp: 16 16 18     Temp:   97.7 ??F (36.5 ??C)    TempSrc:   Oral    SpO2: 94% 93% 95%    Weight:       Height:            Post vital signs: stable    Level of consciousness: awake    Complications: None

## 2017-03-05 NOTE — H&P (Signed)
Pre-sedation Endoscopy History and Physical      03/05/2017  5:15 PM    Diane Stafford is an 31 y.o. female who is here for a   ERCP     Reason for exam:  ERCP for biliary leak     Patient Active Problem List   Diagnosis    Urinary tract infection, site not specified    Obesity    Mental disorders of mother, complicating pregnancy, childbirth, or the puerperium, unspecified as to episode of care    Routine postpartum follow-up    Previous cesarean delivery, antepartum condition or complication    Abscess of abdominal cavity (CMS Dx)       History:  Past Medical History:   Diagnosis Date    Menarche     age 57     Past Surgical History:   Procedure Laterality Date    CESAREAN SECTION      x 4    CHOLECYSTECTOMY      ERCP      WISDOM TOOTH EXTRACTION  2010    two     Family History   Problem Relation Age of Onset    Heart disease Maternal Grandmother      aorta valve problem    Hypertension Maternal Grandmother     Alzheimer's disease Maternal Grandmother     Aneurysm Maternal Grandfather      brain    Hypertension Maternal Grandfather     Mental illness Other      aunt-psych issues    Other Other      2 uncles had open heart surgery    Hypertension Other      two uncles     Social History   Substance Use Topics    Smoking status: Former Smoker    Smokeless tobacco: Never Used      Comment: 11-24-2009    Alcohol use Yes      Comment: occ     No Known Allergies      Home Medications:   Prescriptions Prior to Admission   Medication Sig Dispense Refill Last Dose    ciprofloxacin HCl (CIPRO) 500 MG tablet Take 500 mg by mouth 2 times a day.   03/04/2017 at Unknown time    metronidazole (FLAGYL ORAL) Take by mouth.   03/04/2017 at Unknown time    ALBUTEROL SULFATE (PROVENTIL HFA INHL) as needed.   Unknown at Unknown time    amoxicillin-clavulanate (AUGMENTIN) 875-125 mg per tablet Take 1 tablet by mouth every 12 hours. Til all medicine is taken antibiotic for infection    Unknown at Unknown time     famotidine (PEPCID) 20 MG tablet 20 mg 2 times a day.   Unknown at Unknown time    miscellaneous medical supply Misc fluzone preservative free susp    Unknown at Unknown time    PRENATAL VIT/IRON FUMARATE/FA (PRENATAL ORAL)    Unknown at Unknown time    sertraline (ZOLOFT) 50 MG tablet Take 50 mg by mouth daily.   Unknown at Unknown time       ROS:  Chest pain?  No  Shortness of breath?   No  Recent illness?   Yes  Prior Anesthesia?   Yes  Prior reaction to Anesthesia?   No    Review of Nursing History/Assessment:  Yes    IV site verified?   Yes    Vitals:  Blood pressure 124/76, pulse 86, temperature 98 F (36.7 C), resp. rate 16, height 5'  1 (1.549 m), weight 190 lb (86.2 kg), last menstrual period 02/14/2017, SpO2 96 %.    Physical Exam:  Neuro:  Alert and Oriented x 4, Follows commands  Cardio:  Regular rate and rhythm, no murmurs/rub/gallop  Pulm:  Clear to auscultation bilaterally, no wheezes, rhonchi, rub  Abdomen:  Soft, nontender, nondistended.  Positive bowel sounds.    Airway Assessment:  within normal limits    Mallampati Classification:  II (hard and soft palate, upper portion of tonsils anduvula visible)    ASA Classification:  ASA 3 - Patient with moderate systemic disease with functional limitations    Relevant diagnostic tests and images reviewed:   Yes    Medication Reconciliation Reviewed:  Yes     Labs:   Lab Results   Component Value Date    GLUCOSE 119 (H) 03/05/2017    BUN 8 03/05/2017    CO2 24 03/05/2017    CREATININE 0.74 03/05/2017    K 3.8 03/05/2017    NA 139 03/05/2017    CL 107 03/05/2017    CALCIUM 9.0 03/05/2017     Lab Results   Component Value Date    WBC 7.8 03/05/2017    HGB 11.4 (L) 03/05/2017    HCT 35.1 03/05/2017    MCV 82.3 03/05/2017    PLT 455 (H) 03/05/2017     No results found for: PTT, INR    Assessment/Plan:  ERCP     Sedation plan (type/medications): Deep Sedation general anesthesia    Site Marked:  Not Applicable    Patient / Surrogate has consented for the  above procedure.  Yes     This patient is in satisfactory condition for the procedure and sedation/anesthesia.   Yes     This patient was reevaluated immediately prior to procedure/sedation.  Any changes are noted below.   Yes       Michiel Sites Jeanella Flattery  03/05/2017

## 2017-03-05 NOTE — Anesthesia Pre-Procedure Evaluation (Signed)
[facility]  DEPARTMENT OF ANESTHESIOLOGY  PRE-PROCEDURAL EVALUATION    Diane Stafford is a 31 y.o. year old female presenting for:    Procedure(s):  ERCP    Surgeon:   Floreen Comber, MD    Chief Complaint     Postoperative intra-abdominal abscess, sequela (CMS Dx); Bile leak [K83.9]    Review of Systems     Anesthesia Evaluation    Patient summary reviewed.       No history of anesthetic complications   I have reviewed the History and Physical Exam, any relevant changes are noted in the anesthesia pre-operative evaluation.      Cardiovascular:      (-) hypertension, past MI, CABG/stent, dysrhythmias, angina.    Neuro/Muscoloskeletal/Psych:    Seizures (only as a child. None as adult).    (-) TIA, CVA.     Pulmonary:      (-) COPD, asthma, shortness of breath, recent URI, sleep apnea.       GI/Hepatic/Renal:    GERD (indigestion) is.    (-) renal disease.    Endo/Other:        (-) diabetes mellitus.       Past Medical History     Past Medical History:   Diagnosis Date    Menarche     age 89       Past Surgical History     Past Surgical History:   Procedure Laterality Date    CESAREAN SECTION      x 4    CHOLECYSTECTOMY      ERCP      WISDOM TOOTH EXTRACTION  2010    two       Family History     Family History   Problem Relation Age of Onset    Heart disease Maternal Grandmother      aorta valve problem    Hypertension Maternal Grandmother     Alzheimer's disease Maternal Grandmother     Aneurysm Maternal Grandfather      brain    Hypertension Maternal Grandfather     Mental illness Other      aunt-psych issues    Other Other      2 uncles had open heart surgery    Hypertension Other      two uncles       Social History     Social History     Social History    Marital status: Single     Spouse name: N/A    Number of children: N/A    Years of education: N/A     Occupational History    Not on file.     Social History Main Topics    Smoking status: Former Smoker    Smokeless tobacco: Never Used       Comment: 11-24-2009    Alcohol use Yes      Comment: occ    Drug use: No      Comment: 11-24-2009    Sexual activity: Not on file     Other Topics Concern    Not on file     Social History Narrative    No narrative on file       Medications     Allergies:  No Known Allergies    Home Meds:  Prior to Admission medications as of 03/05/17 1654   Medication Sig Taking?   ciprofloxacin HCl (CIPRO) 500 MG tablet Take 500 mg by mouth 2 times a day. Yes  metronidazole (FLAGYL ORAL) Take by mouth. Yes   ALBUTEROL SULFATE (PROVENTIL HFA INHL) as needed.    amoxicillin-clavulanate (AUGMENTIN) 875-125 mg per tablet Take 1 tablet by mouth every 12 hours. Til all medicine is taken antibiotic for infection     famotidine (PEPCID) 20 MG tablet 20 mg 2 times a day.    miscellaneous medical supply Misc fluzone preservative free susp     PRENATAL VIT/IRON FUMARATE/FA (PRENATAL ORAL)     sertraline (ZOLOFT) 50 MG tablet Take 50 mg by mouth daily.        Inpatient Meds:  Scheduled:    piperacillin-tazobactam (ZOSYN) IV extended interval  3.375 g Intravenous Q8H     Continuous:    dextrose 5 % and 0.45 % NaCl with KCl 20 mEq 100 mL/hr (03/05/17 0751)       PRN: HYDROmorphone **OR** HYDROmorphone, ibuprofen, ondansetron **OR** ondansetron **OR** ondansetron **OR** ondansetron, proMETHazine    Vital Signs     Wt Readings from Last 3 Encounters:   03/05/17 190 lb (86.2 kg)   07/23/10 215 lb 1.6 oz (97.6 kg)     Ht Readings from Last 3 Encounters:   03/05/17 5' 1 (1.549 m)   12/21/09 5' 1 (1.549 m)     Temp Readings from Last 3 Encounters:   03/05/17 98 F (36.7 C)     BP Readings from Last 3 Encounters:   03/05/17 124/76   07/23/10 110/70     Pulse Readings from Last 3 Encounters:   03/05/17 86   07/23/10 80     @LASTSAO2 (3)@    Physical Exam     Airway:     Mallampati: II  Mouth Opening: >2 FB  TM distance: > = 3 FB  Neck ROM: full  (-) no facial hair, neck not short      Dental:        Pulmonary:       Breath sounds clear to  auscultation.       Cardiovascular:     Rhythm: regular  Rate: normal    Neuro/Musculoskeletal/Psych:      Abdominal:       Current OB Status:       Other Findings:        Laboratory Data     Lab Results   Component Value Date    WBC 7.8 03/05/2017    HGB 11.4 (L) 03/05/2017    HCT 35.1 03/05/2017    MCV 82.3 03/05/2017    PLT 455 (H) 03/05/2017       No results found for: [facility]    Lab Results   Component Value Date    GLUCOSE 119 (H) 03/05/2017    BUN 8 03/05/2017    CO2 24 03/05/2017    CREATININE 0.74 03/05/2017    K 3.8 03/05/2017    NA 139 03/05/2017    CL 107 03/05/2017    CALCIUM 9.0 03/05/2017    ALBUMIN 3.5 03/04/2017    ALBUMIN 3.5 03/04/2017    PROT 7.6 03/04/2017    ALKPHOS 100 03/04/2017    ALT 17 03/04/2017    AST 23 03/04/2017    BILITOT 0.6 03/04/2017       No results found for: PTT, INR    Lab Results   Component Value Date    PREGTESTUR NEGATIVE 03/04/2017    HCGQUANT .64 01/18/2010       Anesthesia Plan     ASA 2 - emergency       Female  and opiate use    Anesthesia Type:  general endotracheal.     (S/p lap chole and ERCP with persisting bile leak.    I have reviewed this patient's pre-operative medical record and discussed the anesthetic care plan with the Nurse Anesthetist involved in providing anesthesia for this case.      Anesthetic Plan: This patient will undergo General Anesthesia with Endo-Tracheal Intubation and one PIV to start the case (a second PIV may be started should additional access be necessary). Standard ASA monitoring will be utilized throughout the operative course. Intra-venous opioids will be used for intra-operative pain control. Attention will also be paid to anti-nausea prophylaxis. Post-operatively the patient will be taken to the PACU for anesthesia recovery and on-going pain control. This anesthetic plan was discussed with the patient. The patient was afforded time to ask questions about the anesthetic plan and all questions were answered to the patient's  satisfaction.)    Intravenous induction.    Anesthetic plan and risks discussed with patient.    Plan, alternatives, and risks of anesthesia, including death, have been explained to and discussed with the patient/legal guardian.  By my assessment, the patient/legal guardian understands and agrees.  Scenario presented in detail.  Questions answered.      Plan discussed with CRNA and attending.

## 2017-03-05 NOTE — Unmapped (Signed)
ERCP  Procedure Note    Diane Stafford  03/05/2017      Pre-op Diagnosis: Bile leak [K83.9]       Post-op Diagnosis: Cystic duct remnant bile leak.    Procedure(s): ERCP with stent removal and stent placement.    Findings: The patient underwent ERCP today with removal of the existing plastic biliary stent.  The cholangiogram revealed bile leak from what appeared to be two adjacent cystic duct remnants.  The right hepatic, left hepatic, biliary confluence, and bilateral intrahepatic ducts appeared normal.  An 8 mm x 8 cm fully covered Wallflex metal biliary stent was placed which covers the site of the leak and extends into the duodenum.    Full note to follow.      Surgeon(s):  Floreen Comber, MD    Anesthesia: General anesthesia.  Staff:   Fellow: Kathryne Gin, MD  Endoscopy Nurse: Lydia Guiles, RN; Arlice Colt, RN    Estimated Blood Loss: Minimal                 Specimens:            Drains:   Drain 1 Abdomen Right (Active)   Site Description Unable to view 03/05/2017  9:00 AM   Drain Type Bulb 03/05/2017  9:00 AM   Drain Status Open to gravity drainage 03/05/2017  9:00 AM   Dressing Status Clean;Dry;Intact 03/05/2017  9:00 AM   Output (mL) 50 mL 03/05/2017  3:08 PM   Drainage Appearance Bile 03/05/2017  9:00 AM   Number of days: 14         There were no complications unless listed below.        [name]     Date: 03/05/2017  Time: 6:16 PM

## 2017-03-05 NOTE — Other (Signed)
Anesthesia Extubation Criteria:    Airway Device: endotracheal tube    Emergence Details:      Smooth      _x_      Stormy       __       Prolonged   __     Extubation Criteria:      Motor strength intact       _x_      Follows commands        _x_      Good airway reflexes      _x_      OP suctioned                  _x_        Follows commands:  Yes     Patient extubated:  Yes

## 2017-03-05 NOTE — Unmapped (Addendum)
Anesthesia Transfer of Care Note    Patient: Diane Stafford  Procedure(s) Performed: Procedure(s):  ERCP    Patient location: Endoscopy PACU    Anesthesia type: general endotracheal    Airway Device on Arrival to PACU/ICU: Nasal Cannula    IV Access: Peripheral    Monitors Recommended to be Used During PACU/ICU: Standard Monitors    Outstanding Issues to Address: None    Level of Consciousness: responds to stimulation    Post vital signs:    Vitals:    03/05/17 18:15   BP: 155/105   Pulse: 103   Resp: 20   Temp: 98.4 ??F (36.7 ??C)   SpO2: 95%       Complications: None      Date 03/04/17 1500 - 03/05/17 0659 03/05/17 0700 - 03/06/17 0659   Shift 1500-2259 2300-0659 24 Hour Total 0700-1459 1500-2259 2300-0659 24 Hour Total   I  N  T  A  K  E   P.O.  0 0          P.O.  0 0        I.V.  910  (10.6) 910  (10.6) 854  (9.9) 200  (2.3)  1054  (12.2)      I.V.    854   854      Volume (mL) (lactated Ringers infusion)     200  200      Volume (mL) (dextrose 5 % and 0.45 % NaCl with KCl 20 mEq infusion 1000 mL)  910 910        IV Piggyback 1000  1000          Volume (mL) (lactated Ringers 1,000 mL IV fluid) 1000  1000        Shift Total  (mL/kg) 1000 910  (10.6) 1910  (22.2) 854  (9.9) 200  (2.3)  1054  (12.2)   O  U  T  P  U  T   Urine     550  550      Urine     550  550    Drains  250 250 10 50  60      Output (mL) (Drain 1 Abdomen Right)  250 250 10 50  60    Stool             Stool Occurrence     0 x  0 x    Shift Total  (mL/kg)  250  (2.9) 250  (2.9) 10  (0.1) 600  (7)  610  (7.1)   Weight (kg)  86.2 86.2 86.2 86.2 86.2 86.2

## 2017-03-05 NOTE — Unmapped (Signed)
[id number]  _______________________________________________________________________________  Procedure Date: 03/05/2017 5:26 PM     Patient Name: Diane Stafford  MRN: [id number]                         Account Number: [id number]  Date of Birth: 1986/07/11              Admit Type: Outpatient  Age: 31                               Gender: Female  Note Status: Finalized                Attending MD: [name] , MD  _______________________________________________________________________________     Procedure:           ERCP  Indications:         Bile leak. The patient is a 31 y.o. female who is                        currently hospitalized on the acute care surgery service                        after presenting to the emergency department with                        abdominal pain. She is s/p laparoscopic cholecystectomy                        on 02/13/2017 [facility]. The procedure was                        complicated by a bile leak and biloma . She underwent                        ERCP and biliary stent placement on 02/15/17 by Dr.                        Dorrene German. She has also undergone percutaneous biliary                        drain placement on 02/19/2017 for the biloma and was                        treated with Cipro and Flagy). Despite this she has                        persistent symptoms and bilious output from the                        percutaneous drain. ERCP was planned today for further                        evaluation.  Providers:           [name], MD, Kathryne Gin, MD (Fellow)  Referring MD:        Alanson Aly  Medicines:           General Anesthesia  Complications:       No immediate complications.  _______________________________________________________________________________  Procedure:  Pre-Anesthesia Assessment:                       - Prior to the procedure, a History and Physical was                        performed, and patient medications and allergies  were                        reviewed. The patient is competent. The risks and                        benefits of the procedure and the sedation options and                        risks were discussed with the patient. All questions                        were answered and informed consent was obtained. Patient                        identification and proposed procedure were verified by                        the physician, the nurse and the anesthesiologist in the                        pre-procedure area in the procedure room in the                        endoscopy suite. Mental Status Examination: alert and                        oriented. Airway Examination: normal oropharyngeal                        airway and neck mobility. Respiratory Examination: clear                        to auscultation. CV Examination: normal. Prophylactic                        Antibiotics: The patient does not require prophylactic                        antibiotics. Prior Anticoagulants: The patient has taken                        no previous anticoagulant or antiplatelet agents. ASA                        Grade Assessment: II - A patient with mild systemic                        disease. After reviewing the risks and benefits, the                        patient was deemed in satisfactory condition to undergo  the procedure. The anesthesia plan was to use general                        anesthesia. Immediately prior to administration of                        medications, the patient was re-assessed for adequacy to                        receive sedatives. The heart rate, respiratory rate,                        oxygen saturations, blood pressure, adequacy of                        pulmonary ventilation, and response to care were                        monitored throughout the procedure. The physical status                        of the patient was re-assessed after the procedure.                       After  obtaining informed consent, the scope was passed                        under direct vision. Throughout the procedure, the                        patient's blood pressure, pulse, and oxygen saturations                        were monitored continuously. The Duodenoscope was                        introduced through the mouth, and advanced to the                        duodenum and used to inject contrast into the bile duct.                        The ERCP was accomplished without difficulty. The                        patient tolerated the procedure well.                                                                                   Findings:       The procedure was performed with the patient in a supine position. An        Olympus 180 duodenuscope was advanced to the second portion of the        duodenum. A scout film revealed a plastic stent in the RUQ. Endoscopic  views of the stomach and duodenum were normal. A previously placed        plastic biliary stent was seen protruding from the major papilla which        was easily extracted using a snare. The bile duct was cannulated using a        RX 39 sphinctretome preloaded with a 0.025-inch Visiglide guidewire. The        orifice of the major papilla was widely patent, compatible with a prior        biliary sphincterotomy. Balloon occlusion cholangiogram images obtained        using Conray revealed extravasation of injected contrast from what        appeared to be two short remnant cystic ducts. The remainder of the        common duct, right and left hepatic ducts, and bilateral intrahepatic        ducts appeared normal. Specifically, no missing segments or other ductal        injury was identified. Next, an 8 mm (diameter) and 80 mm (length) fully        covered WallFlex metal stent was placed. The intraductal end of the        stent is downstream to the biliary confluence and the stent extends into        the duodenum, covering the site of the leak. The  stent appeared to be in        excellent position and njected biliary conrast drained completely        following stent placement. The pancreatic duct was not cannulated. The        stomach was aspirated the procedure terminated.                                                                                   Impression:          - Successful biliary stent removal                       - A bile leak was seen at what appeared to be two                        separate short cytic duct remnants. The cholangiogram                        had an otherwise normal post-cholecystectomy appearance.                       - Successful placement of an 8 mm x 80 mm fully covered                        WallFlex metal biliary stent. The stent extends from                        approximately 1 cm downstream to the biliary confluence  to the duodenum and covers the site of the bile leak.  Recommendation:      - Return patient to hospital ward for ongoing care.                       - Repeat ERCP in 6-8 weeks for stent removal and repeat                        cholangiogram. Contact Chancy Milroy at [phone] to                        schedule.                       - Monitor symptoms and output from the percutaneous                        drain.                                                                                   Procedure Code(s):   --- Professional ---                       [phone], GC, Endoscopic retrograde                        cholangiopancreatography (ERCP); diagnostic, including                        collection of specimen(s) by brushing or washing, when                        performed (separate procedure)  Diagnosis Code(s):   --- Professional ---                       K83.9, Disease of biliary tract, unspecified                       K83.8, Other specified diseases of biliary tract    CPT copyright 2016 [employer]. All rights reserved.    The codes documented in  this report are preliminary and upon coder review may   be revised to meet current compliance requirements.  Attending Participation:       I was present and participated during the entire procedure, including        non-key portions.                                                                                     __________________  [name], MD  03/05/2017 8:43:30 PM  This report has been signed electronically.Floreen Comber ,  MD    ________________  Kathryne Gin, MD  Total Procedure Duration Time 0 hours 32 minutes 15 seconds   Scope In: 5:28:04 PM  Scope Out: 6:00:19 PM         [address] [place], [place] [address]

## 2017-03-05 NOTE — Unmapped (Signed)
Pt with c/o pain not due for pain meds until 2400. RN paged MD Zingg. MD stated will place orders. Orders to be completed. Pt notified. Agreed.[email or web address].RN

## 2017-03-05 NOTE — Nursing Note (Signed)
Patient admitted to 9024 via WC from the ED. Alert and oriented X4. Has c/o indigestion and nausea. Lost IV access. N.O. for Phenergan Q6. Dilaudid for pain. Slept throughout the shift. Surgical team at bedside, placed a JP ball on patient's existing drain. NPO status maintained.

## 2017-03-05 NOTE — Unmapped (Signed)
[facility]  GI CONSULT NOTE    Name: Diane Stafford  MRN: [id number]  CSN: [id number]    Consulted by: Jeani Hawking, Diane Stafford    Reason for consult: Need for ERCP for bile leak    History of Present Illness:  Diane Stafford is a 31 y.o. female with no sig past medical history who presented on to [facility] 7/28 at OSH for abdominal pain, nausea, diarrhea, malaise. Pt had after lap chole on 7/9 for gallstones. This procedure was complicated by ERCP with biliary stent placement for bile leak on 7/11. Pt had recurrence of biloma and underwent perc drain placement via IR on 7/15 and was discharged with cipro/flagyl x7 days. She presented to [facility] after 5 days of RUQ pain, nausea, vomiting and bilious emesis/diarrhea and decreased PO intake. She has been having increased green output from her perc drain and has had around 400-500 cc of output. On admission, pt was HDS and afebrile. Labs including CBC, chem, LFTs were WNL except for plts of 455 and CRP of 12.8. CT ABD/ PE showedMultiple rim-enhancing lesions throughout the abdomen and pelvis concerning for multiple intra-abdominal abscesses. Pigtail catheter tip terminates at the inferior aspect of the liver without significant fluid surrounding the catheter tip. Small amount of pneumobilia within the left hepatic lobe, which is nonspecific but likely postsurgical in the setting of a common bile duct stent.    Unable to obtain records from [facility] via careeverywhere  Past Medical History:   Diagnosis Date   ??? Menarche     age 52        Past Surgical History:   Procedure Laterality Date   ??? CESAREAN SECTION      x 4   ??? CHOLECYSTECTOMY     ??? WISDOM TOOTH EXTRACTION  2010    two          Current Facility-Administered Medications:  dextrose 5 % and 0.45 % NaCl with KCl 20 mEq   HYDROmorphone   Or   HYDROmorphone   ibuprofen   ondansetron   Or   ondansetron   Or   ondansetron   Or   ondansetron   piperacillin-tazobactam (ZOSYN) IV extended interval   proMETHazine       No Known  Allergies     Social History   Substance Use Topics   ??? Smoking status: Former Smoker   ??? Smokeless tobacco: Never Used      Comment: 11-24-2009   ??? Alcohol use Yes      Comment: occ        Family History   Problem Relation Age of Onset   ??? Mental illness Other      aunt-psych issues   ??? Other Other      2 uncles had open heart surgery   ??? Hypertension Other      two uncles   ??? Heart disease Maternal Grandmother      aorta valve problem   ??? Hypertension Maternal Grandmother    ??? Alzheimer's disease Maternal Grandmother    ??? Aneurysm Maternal Grandfather      brain   ??? Hypertension Maternal Grandfather         Review of Systems  Review of Systems - General ROS: negative for - chills, fever, malaise, night sweats, sleep disturbance, weight gain or weight loss +malaise, decreased PO intake  Psychological ROS: negative for - anxiety, concentration difficulties, depression, memory difficulties or suicidal ideation  Ophthalmic ROS: negative for - blurry  vision, decreased vision, double vision or loss of vision  ENT ROS: negative for - epistaxis, headaches, hearing change, sore throat or vertigo  Allergy and Immunology ROS: negative for - hives, nasal congestion or seasonal allergies  Hematological and Lymphatic ROS: negative for - bleeding problems, blood clots or swollen lymph nodes  Endocrine ROS: negative for - polydipsia/polyuria or temperature intolerance  Respiratory ROS: negative for - cough, hemoptysis, orthopnea, pleuritic pain, shortness of breath or wheezing  Cardiovascular ROS: negative for - chest pain, dyspnea on exertion or palpitations  Gastrointestinal ROS: negative for -, hematemesis,  or swallowing difficulty/pain+change in bowel habits, + nausea/vomiting  Genito-Urinary ROS: no dysuria, trouble voiding, or hematuria  Musculoskeletal ROS: negative for - joint pain or muscular weakness  Neurological ROS: negative for - dizziness, gait disturbance, seizures or tremors  Dermatological ROS: negative for  nail changes, pruritus and rash      Vitals:  Temp:  [97.7 ??F (36.5 ??C)-98.7 ??F (37.1 ??C)] 97.7 ??F (36.5 ??C)  Heart Rate:  [85-114] 100  Resp:  [16-36] 16  BP: (109-136)/(69-98) 135/69    Intake/Output Summary (Last 24 hours) at 03/05/17 0852  Last data filed at 03/05/17 0825   Gross per 24 hour   Intake             1910 ml   Output              260 ml   Net             1650 ml       Physical Exam   Nursing note and vitals reviewed.  Constitutional: Oriented to person, place, and time. Vital signs are normal. Patient appears well-developed and well-nourished. The patient does not appear ill. No distress. Obese  HEENT:   Head: Normocephalic and atraumatic.   Nose: Nose normal.   Mouth/Throat: Oropharynx is clear and moist.   Eyes: Conjunctivae, EOM and lids are normal. Pupils are equal, round, and reactive to light. No scleral icterus.   Neck: Trachea normal and normal range of motion. Neck supple. No JVD present. No tracheal deviation present. No mass and no thyromegaly present.   Cardiovascular: Normal rate, regular rhythm, S1 normal, S2 normal and normal heart sounds.  Exam reveals no gallop, no S3, no S4 and no friction rub.  No murmur heard.  Pulmonary/Chest: Effort normal and breath sounds normal. No stridor. No respiratory distress. No decreased breath sounds. No wheezes. No rales.   Abdominal: Soft. Normal appearance and bowel sounds are normal. No shifting dullness, no distension, no fluid wave and no mass. There is no hepatomegaly. There is no splenomegaly. There is tenderness throughout to soft and deep palpation. Laparoscopic scars, well healing on stomach. There is no rigidity, no rebound, no guarding. Drain in place with bilious fluid  Musculoskeletal: Normal range of motion. No peripheral edema and no tenderness.     No muscle wasting.   Lymphadenopathy: No cervical adenopathy. Right: No inguinal adenopathy present. Left: No inguinal adenopathy present.   Neurological: The patient is alert and oriented  to person, place, and time. No tremor. No gross cranial nerve deficit. No asterixis   Skin: Skin is warm, dry and intact. No bruising and no rash noted. The patient is not diaphoretic. No cyanosis. No pallor. Nails show no clubbing. No spider angiomata, no palmar erythema   Psychiatric: The patient has a normal mood and affect. The patient's speech is normal and behavior is normal. Cognition is normal  Laboratory:  Lab Results   Component Value Date    WBC 7.8 03/05/2017    HGB 11.4 (L) 03/05/2017    HCT 35.1 03/05/2017    MCV 82.3 03/05/2017    PLT 455 (H) 03/05/2017     Lab Results   Component Value Date    CREATININE 0.74 03/05/2017    BUN 8 03/05/2017    NA 139 03/05/2017    K 3.8 03/05/2017    CL 107 03/05/2017    CO2 24 03/05/2017     No results found for: PTT, INR   Lab Results   Component Value Date    ALKPHOS 100 03/04/2017    ALT 17 03/04/2017    AST 23 03/04/2017    BILITOT 0.6 03/04/2017    ALBUMIN 3.5 03/04/2017    ALBUMIN 3.5 03/04/2017    BILIDIRECT 0.2 03/04/2017         Assessment/Plan:    DANASHA MELMAN is a 31 y.o. female with no sig past medical history who presented on to [facility] 7/28 at OSH for abdominal pain, nausea, diarrhea, malaise now found to have intra abdominal infection and concern for continued bile leak and need for ERCP    - Will discuss timing and need for ERCP with our advanced endoscopy team  - Agree with obtaining records for [facility] E's as you are already attempting  - Agree with antibiotics at this time  - We will continue to follow along with you    This consult will be discussed with Dr. Jeanella Flattery for GI consults.     Diane Asal, Diane Stafford  03/05/2017  8:52 AM

## 2017-03-05 NOTE — Unmapped (Signed)
SURGERY PROGRESS NOTE  Admit Date: 03/04/2017  OR/Admit Date:     Khady Vandenberg is a 31 y.o. female who presented to the ED with history of Lap Chole (7/9) complicated by ERCP with biliary stent placement for bile leak  (7/11) at OSH w/recurrence of biloma and underwent drain placement by OSH IR on 7/15, treated w/cipro&flagyl for 7 days    Subjective:   No acute events overnight.   Afebrile, HDS.   250 cc out of IR drains  +flatus  Objective:   Location could not be evaluated. This SmartLink does not work with rows of the type: String Type  BP  Min: 109/76  Max: 136/98  Pulse  Avg: 99.9  Min: 85  Max: 114  Temp  Avg: 98.3 ??F (36.8 ??C)  Min: 98 ??F (36.7 ??C)  Max: 98.7 ??F (37.1 ??C)  SpO2  Avg: 99 %  Min: 97 %  Max: 100 %  Blood pressure 113/71, pulse 103, temperature 98 ??F (36.7 ??C), temperature source Oral, resp. rate 18, height 5' 1 (1.549 m), weight 190 lb (86.2 kg), last menstrual period 02/14/2017, SpO2 100 %.    Intake/Output Summary (Last 24 hours) at 03/05/17 0604  Last data filed at 03/05/17 0410   Gross per 24 hour   Intake             1608 ml   Output                0 ml   Net             1608 ml     No intake/output data recorded.    Physical Exam  GEN: NAD  CV: RRR  RESP: unlabored, normal inspiratory effort  ABD: soft, LU epigastric, RUQ tender on palpation, nondistended, no rebound or guarding, 1 large JP drain present along R side of abdomen  EXT: wwp    Recent Labs      03/04/17   1624   WBC  9.2   HGB  12.9   HCT  39.3   MCV  81.7   PLT  636*     Recent Labs      03/04/17   1624  03/04/17   1823   NA  138  137   K  3.8  3.8   CL  103  103   CO2  24  24   PHOS  3.8  4.2   BUN  10  10   CREATININE  0.70  0.71   CALCIUM  9.5  9.2     No results for input(s): POCGLU, POCGMD in the last 72 hours.    Invalid input(s): GLU  Recent Labs      03/04/17   1624  03/04/17   1823   BILITOT  0.6  0.6   AST  31  23   ALT  20  17   ALKPHOS  104  100   LIPASE  50   --      No results for input(s): INR, PROTIME in the  last 72 hours.    Current Medications:  Scheduled Medications:      IV Meds:    dextrose 5 % and 0.45 % NaCl with KCl 20 mEq Last Rate: 125 mL/hr (03/04/17 2156)      PRN Medications:    HYDROmorphone 0.5 mg Q4H PRN   Or     HYDROmorphone 1 mg Q4H PRN   ibuprofen 600 mg Q6H PRN  ondansetron 4 mg Q6H PRN   Or     ondansetron 8 mg Q6H PRN   Or     ondansetron 4 mg Q6H PRN   Or     ondansetron 8 mg Q6H PRN   proMETHazine 12.5 mg Q6H PRN       Assessment / Plan:   Nashaly Dorantes is a 31 y.o. female who presented to the ED with history of Lap Chole (7/9) complicated by ERCP with biliary stent placement for bile leak  (7/11) at OSH w/recurrence of biloma and underwent drain placement by OSH IR on 7/15, treated w/cipro&flagyl for 7 days    NEURO: A&Ox4   - pain management:dilaudid or ibuprofen  RESP:    - encourage OOB, ambulation, IS  GI/FEN:    - Nutrition: regular diet    HEME: No current signs/symptoms of bleeding    ID:   [email or web address] for abx coverage    PPX: HSQ, SCDs    DISPO: Floor    Plan:  -in discussion with GI regarding f/o ERCP for stent migration or malplacement    Teodora Medici, MD  General Surgery, PGY-1  03/05/2017

## 2017-03-05 NOTE — Unmapped (Signed)
Pt returned from Endo and c/o pain  . RN assessed vitals BP 153/108 HR 93. Pt denies any other symptoms except pain.RN to administer pain meds and reassess vitals. MD Zingg notified.[email or web address].RN

## 2017-03-06 DIAGNOSIS — K9189 Other postprocedural complications and disorders of digestive system: Principal | ICD-10-CM

## 2017-03-06 LAB — CBC
Hematocrit: 36.6 % (ref 35.0–45.0)
Hemoglobin: 11.9 g/dL (ref 11.7–15.5)
MCH: 26.6 pg (ref 27.0–33.0)
MCHC: 32.6 g/dL (ref 32.0–36.0)
MCV: 81.5 fL (ref 80.0–100.0)
MPV: 7.3 fL (ref 7.5–11.5)
Platelets: 490 10*3/uL (ref 140–400)
RBC: 4.49 10*6/uL (ref 3.80–5.10)
RDW: 13.9 % (ref 11.0–15.0)
WBC: 8 10*3/uL (ref 3.8–10.8)

## 2017-03-06 LAB — BASIC METABOLIC PANEL
Anion Gap: 7 mmol/L (ref 3–16)
BUN: 6 mg/dL — ABNORMAL LOW (ref 7–25)
CO2: 25 mmol/L (ref 21–33)
Calcium: 9.2 mg/dL (ref 8.6–10.3)
Chloride: 104 mmol/L (ref 98–110)
Creatinine: 0.65 mg/dL (ref 0.60–1.30)
Glucose: 125 mg/dL — ABNORMAL HIGH (ref 70–100)
Osmolality, Calculated: 281 mosm/kg (ref 278–305)
Potassium: 4.2 mmol/L (ref 3.5–5.3)
Sodium: 136 mmol/L (ref 133–146)
eGFR AA CKD-EPI: >90 See note.
eGFR NONAA CKD-EPI: >90 See note.

## 2017-03-06 LAB — CLOSTRIDIUM DIFFICILE DNA AMPLIFICATION: Clost. Diff DNA Amp.: NEGATIVE

## 2017-03-06 LAB — MAGNESIUM: Magnesium: 2.2 mg/dL (ref 1.5–2.5)

## 2017-03-06 LAB — PHOSPHORUS: Phosphorus: 3.7 mg/dL (ref 2.1–4.7)

## 2017-03-06 MED ORDER — lidocaine (LIDODERM) 5 % 1 patch
5 | TOPICAL | Status: AC
Start: 2017-03-06 — End: 2017-03-09
  Administered 2017-03-07: 02:00:00 1 via TRANSDERMAL

## 2017-03-06 MED ORDER — oxyCODONE (ROXICODONE) immediate release tablet 5 mg
5 | ORAL | Status: AC | PRN
Start: 2017-03-06 — End: 2017-03-09

## 2017-03-06 MED ORDER — fluconazole (DIFLUCAN) 400 mg IV in NaCl (iso-osm) 200 mL
400 | INTRAVENOUS | Status: AC
Start: 2017-03-06 — End: 2017-03-07
  Administered 2017-03-06: 400 mg via INTRAVENOUS

## 2017-03-06 MED ORDER — heparin (porcine) injection 5,000 Units
5000 | Freq: Three times a day (TID) | INTRAMUSCULAR | Status: AC
Start: 2017-03-06 — End: 2017-03-09
  Administered 2017-03-06 – 2017-03-07 (×3): 5000 [IU] via SUBCUTANEOUS

## 2017-03-06 MED ORDER — oxyCODONE (ROXICODONE) immediate release tablet 10 mg
5 | ORAL | Status: AC | PRN
Start: 2017-03-06 — End: 2017-03-09
  Administered 2017-03-06 – 2017-03-09 (×12): 10 mg via ORAL

## 2017-03-06 MED FILL — DILAUDID (PF) 0.5 MG/0.5 ML INJECTION SYRINGE: 0.5 0.5 mg/0.5 mL | INTRAMUSCULAR | Qty: 0.5

## 2017-03-06 MED FILL — DILAUDID (PF) 0.5 MG/0.5 ML INJECTION SYRINGE: 0.5 0.5 mg/0.5 mL | INTRAMUSCULAR | Qty: 1

## 2017-03-06 MED FILL — FLUCONAZOLE 400 MG/200 ML IN SOD. CHLORIDE(ISO) INTRAVENOUS PIGGYBACK: 400 400 mg/200 mL | INTRAVENOUS | Qty: 200

## 2017-03-06 MED FILL — HEPARIN (PORCINE) 5,000 UNIT/ML INJECTION SOLUTION: 5000 5,000 unit/mL | INTRAMUSCULAR | Qty: 1

## 2017-03-06 MED FILL — PROMETHAZINE 25 MG/ML INJECTION SOLUTION: 25 25 mg/mL | INTRAMUSCULAR | Qty: 1

## 2017-03-06 MED FILL — ZOSYN 3.375 GRAM/50 ML IN DEXTROSE (ISO-OSMOTIC) INTRAVENOUS PIGGYBACK: 3.375 3.375 gram/50 mL | INTRAVENOUS | Qty: 50

## 2017-03-06 MED FILL — LIDODERM 5 % TOPICAL PATCH: 5 5 % | TOPICAL | Qty: 1

## 2017-03-06 MED FILL — OXYCODONE 5 MG TABLET: 5 5 MG | ORAL | Qty: 2

## 2017-03-06 MED FILL — ONDANSETRON HCL (PF) 4 MG/2 ML INJECTION SOLUTION: 4 4 mg/2 mL | INTRAMUSCULAR | Qty: 4

## 2017-03-06 MED FILL — IBUPROFEN 200 MG TABLET: 200 200 MG | ORAL | Qty: 3

## 2017-03-06 NOTE — Unmapped (Signed)
[facility] PROGRESS NOTE  Admit Date: 03/04/2017  OR Date: 03/05/2017      Subjective:   HDS on room air  2.1 in via IV / adequate UOP.  270 mL out of perc drain from OSH (biloma)  1 BM.  Pain better controlled this AM  s/p ERCP on evening of 7/29      Objective:   Vitals:  Temp:  [97.4 ??F (36.3 ??C)-98.3 ??F (36.8 ??C)] 98.3 ??F (36.8 ??C)  Heart Rate:  [59-100] 87  Resp:  [16-18] 18  BP: (121-165)/(69-108) 124/89  FiO2:  [48 %-97 %] 95 %  Vitals:    03/06/17 0410   BP: 124/89   Pulse: 87   Resp: 18   Temp: 98.3 ??F (36.8 ??C)   SpO2: 94%         Date 03/05/17 0700 - 03/06/17 0659 03/06/17 0700 - 03/07/17 0659   Shift 0700-1459 1500-2259 2300-0659 24 Hour Total 0700-1459 1500-2259 2300-0659 24 Hour Total   I  N  T  A  K  E   P.O.  0 0 0          P.O.  0 0 0        I.V.  (mL/kg) 854  (9.9) 766  (8.9) 520  (6) 2140  (24.8)          I.V. 854 [phone]          Volume (mL) (lactated Ringers infusion)  200  200        Shift Total  (mL/kg) 854  (9.9) 766  (8.9) 520  (6) 2140  (24.8)       O  U  T  P  U  T   Urine  (mL/kg/hr)  850  (1.2) 950 1800          Urine  [phone]        Drains 10 130 130 270          Output (mL) (Drain 1 Abdomen Right) 10 130 130 270        Stool              Stool Occurrence  0 x 1 x 1 x        Shift Total  (mL/kg) 10  (0.1) 980  (11.4) 1080  (12.5) 2070  (24)       Weight (kg) 86.2 86.2 86.2 86.2 86.2 86.2 86.2 86.2       Physical Exam:  Gen: Alert and oriented x3, no acute distress  HEENT: NCAT  CV: Regular rate and rhythm  Resp: Even and easy, no respiratory distress  Abd: Soft, non-distended, non-tender, no masses, midline abdominal drain with bilious OP to bulb suction  Ext: Warm and well perfused  Wound: Incision clean/dry/intact    Recent Labs      03/04/17   1624  03/05/17   0617   WBC  9.2  7.8   HGB  12.9  11.4*   HCT  39.3  35.1   MCV  81.7  82.3   PLT  636*  455*     Recent Labs      03/04/17   1624  03/04/17   1823  03/05/17   0617   NA  138  137  139   K  3.8  3.8  3.8   CL   103  103  107   CO2  24  24  24  PHOS  3.8  4.2  4.3   BUN  10  10  8    CREATININE  0.70  0.71  0.74   CALCIUM  9.5  9.2  9.0     No results for input(s): POCGLU, POCGMD in the last 72 hours.    Invalid input(s): GLU  Recent Labs      03/04/17   1624  03/04/17   1823   BILITOT  0.6  0.6   AST  31  23   ALT  20  17   ALKPHOS  104  100   LIPASE  50   --      No results for input(s): INR, PROTIME in the last 72 hours.    Current Medications:  Scheduled Medications:    HYDROmorphone     piperacillin-tazobactam (ZOSYN) IV extended interval 3.375 g Q8H      IV Meds:    dextrose 5 % and 0.45 % NaCl with KCl 20 mEq Last Rate: 100 mL/hr (03/06/17 0455)      PRN Medications:    HYDROmorphone 0.5 mg Q4H PRN   Or     HYDROmorphone 1 mg Q4H PRN   ibuprofen 600 mg Q6H PRN   ondansetron 4 mg Q6H PRN   Or     ondansetron 8 mg Q6H PRN   Or     ondansetron 4 mg Q6H PRN   Or     ondansetron 8 mg Q6H PRN   proMETHazine 12.5 mg Q6H PRN       Imaging:  No results found.      Assessment / Plan:   Diane Stafford is a 31 y.o. female  who presented to the ED with history of Lap Chole (7/9) complicated by ERCP with biliary stent placement for bile leak (7/11) at OSH w/recurrence of biloma and underwent drain placement by OSH IR on 7/15.  She is s/p treatment w/Cipro&Flagyl for 7 days    Bile Leak / Biloma after lap chole at OSH  - ERCP with biliary stent placed at OSH 7/11.  - Perc drain placement 7/15 s/p 7 days of Cipro and Flagyl at OSH  - Continued increased bilious OP; s/p ERCP at [facility] on 7/29 with [name]     - Prior biliary stent was removed   - Bile leak persistent to two short cystic duct remnants: placed 8x80 metal biliary stent  - Decreased OP from perc drain today  - Will have IR look at additional fluid collections to determine if able to access / drain  - NPO; will advance to Clear liquid diet once completed or discussed with IR.  - Continue Zosyn  - MIVF with IV Tylenol and IV Dilaudid prn for pain control.  Will transition  to oral options later today when taking PO.    - Hold DVT prophylaxis this AM for potential IR.  Will restart later today.    - Cdiff precautions / cx pending from multiple bouts of diarrhea prior to admission.  No current episodes of diarrhea.    Nero Sawatzky S. Edwena Bunde, CNP  Division of Trauma, Surgical Critical Care, and Acute Care Surgery  Acute Care Surgery Pager:  [phone]

## 2017-03-06 NOTE — Unmapped (Signed)
Addendum  created 03/06/17 1111 by Sammie Bench, RNSA    Sign clinical note

## 2017-03-06 NOTE — Care Coordination-Inpatient (Signed)
[facility]  Care Management Department    High Risk Screen    Name: Diane Stafford  MRN:  [id number]  Date:   03/06/2017     High Risk Screen  Patient admitted from nursing home, group home or rehab facility: No  Patient is over 32 years of age and lives alone: No  Patient is active with Hospice services: No  Patient is suspected victim of abuse, neglect, violence: No  Current alcohol or drug abuse: No  Patient is homeless: No  Patient is from justice center or on police hold: No  Patient has a new diagnosis of a terminal illness: No  Patient has a new diagnosis of a chronic illness: No  Patient has multiple co-morbidities and/or>5 home medications: Yes  Patient has no PCP or medical home: No  Patient has history of dementia, mental illness or DD: Yes  Patient has no payer source: No  Patient has previous admission within last 30 days: No  Anticipate specialized home health needs, i.e., wounds, trach, cellulitis, ostomy, tube-feeds, TPN, DME: No  Score: 4    Assignment of Risk Level    Patient has been screened by Care Mangement and meets Moderate Risk Indicators, psychosocial assessment to follow. (Score of 2-4)    Tanja Port, MSSW, LSW  [phone]

## 2017-03-06 NOTE — Unmapped (Signed)
Anesthesia Post Note    Patient: Diane Stafford    Procedure(s) Performed: Procedure(s):  ERCP    Anesthesia type: general endotracheal    Patient location: Med Surgical Floor    Post pain: Adequate analgesia    Post assessment: no apparent anesthetic complications, tolerated procedure well, no evidence of recall, able to flex hips and bend knees bilateral and voided    Last Vitals:   Vitals:    03/05/17 2225 03/06/17 0007 03/06/17 0410 03/06/17 0815   BP: 149/89 (!) 130/92 124/89 136/73   BP Location:  Right arm Right arm Left arm   Patient Position:  Lying Lying Lying   Pulse: 59 92 87 94   Resp:  18 18 14    Temp:  97.4 ??F (36.3 ??C) 98.3 ??F (36.8 ??C) 97.7 ??F (36.5 ??C)   TempSrc:  Oral Oral Oral   SpO2:  99% 94% 97%   Weight:       Height:            Post vital signs: stable    Level of consciousness: awake, alert  and oriented    Complications: None

## 2017-03-07 MED ORDER — acetaminophen (OFIRMEV) Soln 1,000 mg
1000 | Freq: Once | INTRAVENOUS | Status: AC
Start: 2017-03-07 — End: 2017-03-08
  Administered 2017-03-08: 15:00:00 1000 mg via INTRAVENOUS

## 2017-03-07 MED ORDER — fluconazole (DIFLUCAN) tablet 400 mg
200 | Freq: Every day | ORAL | Status: AC
Start: 2017-03-07 — End: 2017-03-09
  Administered 2017-03-07 – 2017-03-09 (×3): 400 mg via ORAL

## 2017-03-07 MED ORDER — acetaminophen (TYLENOL) tablet 650 mg
325 | Freq: Four times a day (QID) | ORAL | Status: AC | PRN
Start: 2017-03-07 — End: 2017-03-07

## 2017-03-07 MED ORDER — melatonin Tab 3 mg
3 | Freq: Every evening | ORAL | Status: AC
Start: 2017-03-07 — End: 2017-03-09
  Administered 2017-03-08 – 2017-03-09 (×2): 3 mg via ORAL

## 2017-03-07 MED ORDER — ketorolac (TORADOL) injection 15 mg
15 | Freq: Four times a day (QID) | INTRAMUSCULAR | Status: AC
Start: 2017-03-07 — End: 2017-03-08
  Administered 2017-03-08 (×4): 15 mg via INTRAVENOUS

## 2017-03-07 MED ORDER — ibuprofen (ADVIL,MOTRIN) tablet 800 mg
800 | Freq: Three times a day (TID) | ORAL | Status: AC
Start: 2017-03-07 — End: 2017-03-07
  Administered 2017-03-07: 17:00:00 800 mg via ORAL

## 2017-03-07 MED ORDER — acetaminophen (OFIRMEV) Soln 1,000 mg
1000 | Freq: Once | INTRAVENOUS | Status: AC
Start: 2017-03-07 — End: 2017-03-08
  Administered 2017-03-08: 07:00:00 1000 mg via INTRAVENOUS

## 2017-03-07 MED ORDER — ciprofloxacin HCl (CIPRO) tablet 250 mg
500 | Freq: Two times a day (BID) | ORAL | Status: AC
Start: 2017-03-07 — End: 2017-03-09
  Administered 2017-03-07 – 2017-03-09 (×5): 250 mg via ORAL

## 2017-03-07 MED ORDER — gabapentin (NEURONTIN) capsule 100 mg
100 | Freq: Three times a day (TID) | ORAL | Status: AC
Start: 2017-03-07 — End: 2017-03-09
  Administered 2017-03-07 – 2017-03-09 (×6): 100 mg via ORAL

## 2017-03-07 MED ORDER — acetaminophen (TYLENOL) tablet 975 mg
325 | Freq: Three times a day (TID) | ORAL | Status: AC
Start: 2017-03-07 — End: 2017-03-07
  Administered 2017-03-07: 17:00:00 975 mg via ORAL

## 2017-03-07 MED ORDER — senna (SENOKOT) tablet 1 tablet
8.6 | Freq: Every evening | ORAL | Status: AC
Start: 2017-03-07 — End: 2017-03-09

## 2017-03-07 MED ORDER — acetaminophen (OFIRMEV) Soln 1,000 mg
1000 | Freq: Once | INTRAVENOUS | Status: AC
Start: 2017-03-07 — End: 2017-03-07
  Administered 2017-03-08: 01:00:00 1000 mg via INTRAVENOUS

## 2017-03-07 MED ORDER — AMOXicillin-clavulanate (AUGMENTIN) 875-125 mg per tablet 1 tablet
875-125 | Freq: Two times a day (BID) | ORAL | Status: AC
Start: 2017-03-07 — End: 2017-03-09
  Administered 2017-03-07 – 2017-03-09 (×5): 1 via ORAL

## 2017-03-07 MED FILL — AMOXICILLIN 875 MG-POTASSIUM CLAVULANATE 125 MG TABLET: 875-125 875-125 mg | ORAL | Qty: 1

## 2017-03-07 MED FILL — LIDODERM 5 % TOPICAL PATCH: 5 5 % | TOPICAL | Qty: 1

## 2017-03-07 MED FILL — OFIRMEV 1,000 MG/100 ML (10 MG/ML) INTRAVENOUS SOLUTION: 1000 1,000 mg/100 mL (10 mg/mL) | INTRAVENOUS | Qty: 100

## 2017-03-07 MED FILL — FLUCONAZOLE 200 MG TABLET: 200 200 MG | ORAL | Qty: 2

## 2017-03-07 MED FILL — IBUPROFEN 800 MG TABLET: 800 800 MG | ORAL | Qty: 1

## 2017-03-07 MED FILL — HEPARIN (PORCINE) 5,000 UNIT/ML INJECTION SOLUTION: 5000 5,000 unit/mL | INTRAMUSCULAR | Qty: 1

## 2017-03-07 MED FILL — OXYCODONE 5 MG TABLET: 5 5 MG | ORAL | Qty: 2

## 2017-03-07 MED FILL — CIPROFLOXACIN 500 MG TABLET: 500 500 MG | ORAL | Qty: 1

## 2017-03-07 MED FILL — MELATONIN 3 MG TABLET: 3 3 mg | ORAL | Qty: 1

## 2017-03-07 MED FILL — GABAPENTIN 100 MG CAPSULE: 100 100 MG | ORAL | Qty: 1

## 2017-03-07 MED FILL — TYLENOL 325 MG TABLET: 325 325 mg | ORAL | Qty: 3

## 2017-03-07 MED FILL — KETOROLAC 15 MG/ML INJECTION SOLUTION: 15 15 mg/mL | INTRAMUSCULAR | Qty: 1

## 2017-03-07 NOTE — Unmapped (Signed)
[facility] PROGRESS NOTE  Admit Date: 03/04/2017  OR Date: 03/05/2017      Subjective:   HDS on room air  240 mL out of perc drain from OSH (biloma)  1 BM.  Pain better controlled this AM    Objective:   Vitals:  Temp:  [97.1 ??F (36.2 ??C)-97.9 ??F (36.6 ??C)] 97.8 ??F (36.6 ??C)  Heart Rate:  [68-94] 68  Resp:  [14-16] 16  BP: (121-143)/(73-102) 142/87  Vitals:    03/07/17 0416   BP: 142/87   Pulse: 68   Resp: 16   Temp: 97.8 ??F (36.6 ??C)   SpO2: 99%         Date 03/06/17 0700 - 03/07/17 0659 03/07/17 0700 - 03/08/17 0659   Shift 0700-1459 1500-2259 2300-0659 24 Hour Total 0700-1459 1500-2259 2300-0659 24 Hour Total   I  N  T  A  K  E   P.O.  400 200 600          P.O.  400 200 600        I.V.  (mL/kg) 900  (10.4) 1048  (12.2) 790  (9.2) 2738  (31.8)          I.V. 900 1048 790 2738        Shift Total  (mL/kg) 900  (10.4) 1448  (16.8) 990  (11.5) 3338  (38.7)       O  U  T  P  U  T   Urine  (mL/kg/hr) 350  (0.5) 1650  (2.4) 900 2900          Urine 350 1650 900 2900          Urine Occurrence 1 x   1 x        Drains 90 50 100 240          Output (mL) (Drain 1 Abdomen Right) 90 50 100 240        Stool              Stool Occurrence 1 x 0 x  1 x        Shift Total  (mL/kg) 440  (5.1) 1700  (19.7) 1000  (11.6) 3140  (36.4)       Weight (kg) 86.2 86.2 86.2 86.2 86.2 86.2 86.2 86.2       Physical Exam:  Gen: Alert and oriented x3, no acute distress  HEENT: NCAT  CV: Regular rate and rhythm  Resp: Even and easy, no respiratory distress  Abd: Soft, non-distended, non-tender, no masses, midline abdominal drain with bilious OP to bulb suction  Ext: Warm and well perfused  Wound: Incision clean/dry/intact    Recent Labs      03/04/17   1624  03/05/17   0617  03/06/17   0638   WBC  9.2  7.8  8.0   HGB  12.9  11.4*  11.9   HCT  39.3  35.1  36.6   MCV  81.7  82.3  81.5   PLT  636*  455*  490*     Recent Labs      03/04/17   1823  03/05/17   0617  03/06/17   0638   NA  137  139  136   K  3.8  3.8  4.2   CL  103  107  104   CO2   24  24  25    PHOS  4.2  4.3  3.7  BUN  10  8  6*   CREATININE  0.71  0.74  0.65   CALCIUM  9.2  9.0  9.2     No results for input(s): POCGLU, POCGMD in the last 72 hours.    Invalid input(s): GLU  Recent Labs      03/04/17   1624  03/04/17   1823   BILITOT  0.6  0.6   AST  31  23   ALT  20  17   ALKPHOS  104  100   LIPASE  50   --      No results for input(s): INR, PROTIME in the last 72 hours.    Current Medications:  Scheduled Medications:    fluconazole in NaCl (iso-osm) 400 mg Q24H   heparin (porcine) 5,000 Units 3 times per day   lidocaine 1 patch Q24H   piperacillin-tazobactam (ZOSYN) IV extended interval 3.375 g Q8H      IV Meds:    dextrose 5 % and 0.45 % NaCl with KCl 20 mEq Last Rate: 100 mL/hr (03/06/17 2154)      PRN Medications:    ibuprofen 600 mg Q6H PRN   ondansetron 4 mg Q6H PRN   Or     ondansetron 8 mg Q6H PRN   Or     ondansetron 4 mg Q6H PRN   Or     ondansetron 8 mg Q6H PRN   oxyCODONE 5 mg Q4H PRN   Or     oxyCODONE 10 mg Q4H PRN   proMETHazine 12.5 mg Q6H PRN       Imaging:  No results found.      Assessment / Plan:   Diane Stafford is a 31 y.o. female  who presented to the ED with history of Lap Chole (7/9) complicated by ERCP with biliary stent placement for bile leak (7/11) at OSH w/recurrence of biloma and underwent drain placement by OSH IR on 7/15.  She is s/p treatment w/Cipro&Flagyl for 7 days    Bile Leak / Biloma after lap chole at OSH  - ERCP with biliary stent placed at OSH 7/11.  - Perc drain placement 7/15 s/p 7 days of Cipro and Flagyl at OSH  - Continued increased bilious OP; s/p ERCP at [facility] on 7/29 with [name]     - Prior biliary stent was removed   - Bile leak persistent to two short cystic duct remnants: placed 8x80 metal biliary stent  - Decreased OP from perc drain today. Will change to biliary bag today  - Unable to drain any fluid collection Will have IR look at additional fluid collections to determine if able to access / drain  - Low fat diet   - Change to  cipro/augmentin/diflucan PO  - Cdiff negative   - MIVF with IV Tylenol and IV Dilaudid prn for pain control.  Will transition to oral options later today when taking PO.  - SQH   - Dispo planning for tomorrow     Eugenie Norrie, MD  Department of Surgery  Division of Trauma, Surgical Critical Care and Acute Care Surgery   Pager:[phone]

## 2017-03-07 NOTE — Care Coordination-Inpatient (Signed)
Received report from CNP concerning discharge planning. Pt anticipated to discharge home mid this week.     Sw received a call from Delaney Meigs [phone]) with [facility] who stated that the pt is active with them for skilled nursing and requested for resumption order at discharge.     [address] [place], [place]  [phone]

## 2017-03-07 NOTE — Unmapped (Signed)
Contacted the team. Patient was emotionally upset and stated that pain was still a 9/10. Patient stated that the meds given to her throughout the day were not controlling abdominal pain. Team stated that they would assess patient.

## 2017-03-08 ENCOUNTER — Inpatient Hospital Stay: Admit: 2017-03-08 | Payer: Medicaid (Managed Care)

## 2017-03-08 MED ORDER — ibuprofen (ADVIL,MOTRIN) tablet 600 mg
200 | Freq: Three times a day (TID) | ORAL | Status: AC
Start: 2017-03-08 — End: 2017-03-09

## 2017-03-08 MED ORDER — Tc-99m technetium mebrofenin 4.5 millicurie
Freq: Once | Status: AC | PRN
Start: 2017-03-08 — End: 2017-03-08
  Administered 2017-03-08: 16:00:00 4.5 via INTRAVENOUS

## 2017-03-08 MED ORDER — dextrose 5 % and 0.45 % NaCl infusion 1000 mL
INTRAVENOUS | Status: AC
Start: 2017-03-08 — End: 2017-03-08
  Administered 2017-03-08: 10:00:00 50 mL/h via INTRAVENOUS

## 2017-03-08 MED FILL — KETOROLAC 15 MG/ML INJECTION SOLUTION: 15 15 mg/mL | INTRAMUSCULAR | Qty: 1

## 2017-03-08 MED FILL — OXYCODONE 5 MG TABLET: 5 5 MG | ORAL | Qty: 2

## 2017-03-08 MED FILL — FLUCONAZOLE 200 MG TABLET: 200 200 MG | ORAL | Qty: 2

## 2017-03-08 MED FILL — MELATONIN 3 MG TABLET: 3 3 mg | ORAL | Qty: 1

## 2017-03-08 MED FILL — OFIRMEV 1,000 MG/100 ML (10 MG/ML) INTRAVENOUS SOLUTION: 1000 1,000 mg/100 mL (10 mg/mL) | INTRAVENOUS | Qty: 100

## 2017-03-08 MED FILL — GABAPENTIN 100 MG CAPSULE: 100 100 MG | ORAL | Qty: 1

## 2017-03-08 MED FILL — HEPARIN (PORCINE) 5,000 UNIT/ML INJECTION SOLUTION: 5000 5,000 unit/mL | INTRAMUSCULAR | Qty: 1

## 2017-03-08 MED FILL — CIPROFLOXACIN 500 MG TABLET: 500 500 MG | ORAL | Qty: 1

## 2017-03-08 MED FILL — AMOXICILLIN 875 MG-POTASSIUM CLAVULANATE 125 MG TABLET: 875-125 875-125 mg | ORAL | Qty: 1

## 2017-03-08 MED FILL — TC-99M TECHNETIUM MEBROFENIN: 4.45 4.45 millicurie | Qty: 1

## 2017-03-08 MED FILL — LIDODERM 5 % TOPICAL PATCH: 5 5 % | TOPICAL | Qty: 1

## 2017-03-08 MED FILL — SENNA LAX 8.6 MG TABLET: 8.6 8.6 mg | ORAL | Qty: 1

## 2017-03-08 NOTE — Consults (Signed)
[place]   Social Work Psychosocial Assessment     ADYLENE RANDO  [id number]  31 y.o.  female  White or Caucasian  Marital Status: Single    Postoperative intra-abdominal abscess, sequela (CMS Dx) Avelino.Null.4XXS, K65.1]    Referred by: Census Review  Referred Reason: Discharge Planning      History    Past Medical History:   Diagnosis Date    Menarche     age 86       History   Drug Use No     Comment: 11-24-2009       History   Alcohol Use    Yes     Comment: occ       Mental Health History: No mental health history reported or documented.     Mental Status    Current Mental Status: Awake, Oriented to Person, Oriented to Place, Oriented to Time, Oriented to Situation  Mental Status Prior to Admission: Awake, Oriented to Person, Oriented to Place, Oriented to Time, Oriented to Situation  Activities of Daily Living: Independent       Current Living Arrangements    Current Living Arrangements: With Friends  Type of Living Arrangement: Home/Apartment    Support Systems    Next of Kin/Contact Person: [phone]  Next of Kin Relationship:  Significant Other  Next of Kin Phone Number: Jamse Belfast    Community Resources Used Prior to Admission: No    Cultural/Spiritual/Language Barriers    Religious/Cultural Factors: Not reported.     Other Pertinent Data    Home Health Agency Name: [facility] Phone Number: [phone]    Case Manager for Mental Health IssuesPrior to Admission: No          Durable Medical Equipment Prior to Admission: No     Pharmacy: [facility] on [address] in [place], [place]     Assessment/Plan    Pt is a 31 y/o Caucasian female who presented to [facility]  with history of Lap Chole (7/9) complicated by ERCP with biliary stent placement for bile leak  (7/11) at OSH w/recurrence of biloma and underwent drain placement by OSH IR on 7/15, treated w/cipro&flagyl for 7 days. She presents to [facility] this admission w/5 day history of RUQ pain, bilious emesis, and bilious  diarrhea.     Sw completed a chart review and meet with pt at bedside. Sw introduced herself and explained Sw role. Pt confirmed demographics and no changes needed.     Pt lives with a roommate and is able to complete ADL's independently. 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 Pt confirmed to be receiving skilled nursing through [facility] and in agreement with continuing to work with them at discharge. Pt denied any further needs at this time.     DCPA, Festus Barren, will send a referral to [facility] and will assist with sending resumption orders at discharge.     Patient/Family aware and taking part in the discharge plan.  Patient and family were offered a post-acute provider list as applicable to the discharge plan and insurance provider.  Patient and family were given the freedom to choose providers and financial interest(s) were disclosed as appropriate.    This assessment has been reviewed with the multi-disciplinary team.    [address], [place], [place]  [phone]

## 2017-03-08 NOTE — Unmapped (Signed)
[facility] PROGRESS NOTE  Admit Date: 03/04/2017  OR Date: 03/05/2017      Subjective:   No acute events overnight  Added neurontin to pain regimen 7/31  Remains HDS  x1 Bm  Drain output past 24 hours  Tolerated diet yesterday; NPO at midnight for HIDA today    Objective:   Vitals:  Temp:  [97.5 ??F (36.4 ??C)-98.1 ??F (36.7 ??C)] 97.7 ??F (36.5 ??C)  Heart Rate:  [76-88] 80  Resp:  [16] 16  BP: (103-131)/(66-87) 109/66  Vitals:    03/08/17 0349   BP: 109/66   Pulse: 80   Resp: 16   Temp: 97.7 ??F (36.5 ??C)   SpO2: 99%         Date 03/07/17 0700 - 03/08/17 0659 03/08/17 0700 - 03/09/17 0659   Shift 0700-1459 1500-2259 2300-0659 24 Hour Total 0700-1459 1500-2259 2300-0659 24 Hour Total   I  N  T  A  K  E   P.O. 240 600 0 840          P.O. 240 600 0 840        I.V.  (mL/kg)  20  (0.2) 270  (3.1) 290  (3.4)          I.V.  20 270 290        Shift Total  (mL/kg) 240  (2.8) 620  (7.2) 270  (3.1) 1130  (13.1)       O  U  T  P  U  T   Urine  (mL/kg/hr) 1400  (2) 400  (0.6) 450 2250          Urine 1400 [phone]        Drains 75 50 100 225          Output (mL) (Drain 1 Abdomen Right) 75 50 100 225        Stool              Stool Occurrence  1 x 0 x 1 x        Shift Total  (mL/kg) 1475  (17.1) 450  (5.2) 550  (6.4) 2475  (28.7)       Weight (kg) 86.2 86.2 86.2 86.2 86.2 86.2 86.2 86.2       Physical Exam:  Gen: Alert and oriented x3, no acute distress  HEENT: NCAT  CV: Regular rate and rhythm  Resp: Even and easy, no respiratory distress  Abd: Soft, non-distended, non-tender, no masses, midline abdominal drain with bilious OP to drainage bag  Ext: Warm and well perfused  Wound: Incision clean/dry/intact    Recent Labs      03/05/17   0617  03/06/17   0638   WBC  7.8  8.0   HGB  11.4*  11.9   HCT  35.1  36.6   MCV  82.3  81.5   PLT  455*  490*     Recent Labs      03/05/17   0617  03/06/17   0638   NA  139  136   K  3.8  4.2   CL  107  104   CO2  24  25   PHOS  4.3  3.7   BUN  8  6*   CREATININE  0.74  0.65    CALCIUM  9.0  9.2     No results for input(s): POCGLU, POCGMD in the last 72 hours.  Invalid input(s): GLU  No results for input(s): BILITOT, AST, ALT, ALKPHOS, GGT, AMYLASE, LIPASE in the last 72 hours.    Invalid input(s): BILIDIR, 5NUC, ALB  No results for input(s): INR, PROTIME in the last 72 hours.    Current Medications:  Scheduled Medications:    acetaminophen 1,000 mg Once   AMOXicillin-clavulanate 1 tablet 2 times per day   ciprofloxacin HCl 250 mg 2 times per day   fluconazole 400 mg Daily 0900   gabapentin 100 mg TID   heparin (porcine) 5,000 Units 3 times per day   ketorolac 15 mg Q6H   lidocaine 1 patch Q24H   melatonin 3 mg Nightly (2100)   senna 1 tablet Nightly (2100)      IV Meds:    dextrose 5 % and 0.45 % NaCl      PRN Medications:    ondansetron 4 mg Q6H PRN   Or     ondansetron 8 mg Q6H PRN   Or     ondansetron 4 mg Q6H PRN   Or     ondansetron 8 mg Q6H PRN   oxyCODONE 5 mg Q4H PRN   Or     oxyCODONE 10 mg Q4H PRN   proMETHazine 12.5 mg Q6H PRN       Imaging:  No results found.      Assessment / Plan:   Diane Stafford is a 31 y.o. female  who presented to the ED with history of Lap Chole (7/9) complicated by ERCP with biliary stent placement for bile leak (7/11) at OSH w/recurrence of biloma and underwent drain placement by OSH IR on 7/15.  She is s/p treatment w/Cipro&Flagyl for 7 days    Bile Leak / Biloma after lap chole at OSH  - ERCP with biliary stent placed at OSH 7/11.  - Perc drain placement 7/15 s/p 7 days of Cipro and Flagyl at OSH  - Continued increased bilious OP; s/p ERCP at [facility] on 7/29 with [name]     - Prior biliary stent was removed   - Bile leak persistent to two short cystic duct remnants: placed 8x80 metal biliary stent  - Discussed with IR; unable to drain any fluid collections  - Perc drain output  - Plan for HIDA today due to increased drain output  - NPO for HIDA; resume diet after  - Continue cipro/augmentin/diflucan PO  - Cdiff negative   - Multimodal pain  control: IV tylenol, lidoderm, neurontin, toradol, neurontin  -bowel regimen: increase senna bid    DVT prophylaxis: Subcutaneous heparin  Dispo: Floor       Elias Else, CNP  Department of Surgery  Division of Trauma, Surgical Critical Care and Acute Care Surgery   Pager:[phone]

## 2017-03-08 NOTE — Unmapped (Addendum)
CCA advised by SW Katrina to send a resumption of home care referral to [facility] for skilled nursing for drain care. Referral has been sent.     [facility] updated [facility] that pt is discharged ready today    CCA sent the home health orders to [facility]. E      [facility] Coordinator Assistant   Hem/Onc/BMT/9CCP  [phone]

## 2017-03-09 LAB — CBC
Hematocrit: 43.3 % (ref 35.0–45.0)
Hemoglobin: 13.8 g/dL (ref 11.7–15.5)
MCH: 26.8 pg (ref 27.0–33.0)
MCHC: 32 g/dL (ref 32.0–36.0)
MCV: 83.9 fL (ref 80.0–100.0)
MPV: 7.8 fL (ref 7.5–11.5)
Platelets: 392 10*3/uL (ref 140–400)
RBC: 5.16 10*6/uL (ref 3.80–5.10)
RDW: 14.4 % (ref 11.0–15.0)
WBC: 11.3 10*3/uL (ref 3.8–10.8)

## 2017-03-09 LAB — BASIC METABOLIC PANEL
Anion Gap: 10 mmol/L (ref 3–16)
BUN: 11 mg/dL (ref 7–25)
CO2: 26 mmol/L (ref 21–33)
Calcium: 10.2 mg/dL (ref 8.6–10.3)
Chloride: 102 mmol/L (ref 98–110)
Creatinine: 0.87 mg/dL (ref 0.60–1.30)
Glucose: 91 mg/dL (ref 70–100)
Osmolality, Calculated: 285 mOsm/kg (ref 278–305)
Potassium: 4.2 mmol/L (ref 3.5–5.3)
Sodium: 138 mmol/L (ref 133–146)
eGFR AA CKD-EPI: >90 See note.
eGFR NONAA CKD-EPI: 88 See note.

## 2017-03-09 LAB — MAGNESIUM: Magnesium: 2.2 mg/dL (ref 1.5–2.5)

## 2017-03-09 LAB — PHOSPHORUS: Phosphorus: 4.4 mg/dL (ref 2.1–4.7)

## 2017-03-09 MED ORDER — oxyCODONE (ROXICODONE) 5 MG immediate release tablet
5 | ORAL_TABLET | ORAL | 0 refills | 6.00000 days | Status: AC | PRN
Start: 2017-03-09 — End: 2017-03-16

## 2017-03-09 MED ORDER — senna (SENOKOT) 8.6 mg tablet
8.6 | ORAL_TABLET | Freq: Two times a day (BID) | ORAL | 0 refills | Status: AC
Start: 2017-03-09 — End: 2017-03-29

## 2017-03-09 MED ORDER — AMOXicillin-clavulanate (AUGMENTIN) 875-125 mg per tablet
875-125 | ORAL_TABLET | Freq: Two times a day (BID) | ORAL | 0 refills | Status: AC
Start: 2017-03-09 — End: 2017-03-14

## 2017-03-09 MED ORDER — gabapentin (NEURONTIN) 100 MG capsule
100 | ORAL_CAPSULE | Freq: Three times a day (TID) | ORAL | 0 refills | Status: AC
Start: 2017-03-09 — End: 2017-03-29

## 2017-03-09 MED ORDER — ciprofloxacin HCl (CIPRO) 250 MG tablet
250 | ORAL_TABLET | Freq: Two times a day (BID) | ORAL | 0 refills | Status: AC
Start: 2017-03-09 — End: 2017-03-14

## 2017-03-09 MED ORDER — fluconazole (DIFLUCAN) 200 MG tablet
200 | ORAL_TABLET | Freq: Every day | ORAL | 0 refills | Status: AC
Start: 2017-03-09 — End: 2017-03-15

## 2017-03-09 MED ORDER — acetaminophen (TYLENOL) tablet 975 mg
325 | Freq: Three times a day (TID) | ORAL | Status: AC
Start: 2017-03-09 — End: 2017-03-09

## 2017-03-09 MED ORDER — acetaminophen (TYLENOL) 325 MG tablet
325 | ORAL | Status: AC
Start: 2017-03-09 — End: 2017-03-09

## 2017-03-09 MED ORDER — melatonin 3 mg Tab
3 | Freq: Every evening | ORAL | 0 refills | Status: AC | PRN
Start: 2017-03-09 — End: 2017-03-31

## 2017-03-09 MED ORDER — ibuprofen (ADVIL,MOTRIN) 600 MG tablet
600 | ORAL_TABLET | Freq: Three times a day (TID) | ORAL | 0 refills | Status: AC | PRN
Start: 2017-03-09 — End: 2017-03-29

## 2017-03-09 MED ORDER — acetaminophen (TYLENOL) 325 MG tablet
325 | ORAL_TABLET | Freq: Three times a day (TID) | ORAL | 0 refills | 11.00000 days | Status: AC | PRN
Start: 2017-03-09 — End: ?

## 2017-03-09 MED FILL — OXYCODONE 5 MG TABLET: 5 5 MG | ORAL | Qty: 2

## 2017-03-09 MED FILL — IBUPROFEN 200 MG TABLET: 200 200 MG | ORAL | Qty: 3

## 2017-03-09 MED FILL — TYLENOL 325 MG TABLET: 325 325 mg | ORAL | Qty: 3

## 2017-03-09 MED FILL — GABAPENTIN 100 MG CAPSULE: 100 100 MG | ORAL | Qty: 1

## 2017-03-09 MED FILL — CIPROFLOXACIN 500 MG TABLET: 500 500 MG | ORAL | Qty: 1

## 2017-03-09 NOTE — Unmapped (Signed)
[facility]    Social Worker Discharge Summary     Patient name: Diane Stafford                                        Patient MRN: [id number]  DOB: 1986-06-16                              Age: 31 y.o.              Gender: female  Patient emergency contact: Extended Emergency Contact Information  Primary Emergency Contact: Dyson,Joshua  Address: [address]           [place], [place] [address] [place]  Home Phone: [phone]  Mobile Phone: [phone]  Relation: Significant other      Attending provider: No att. providers found  Primary care physician: Santiago Glad (Inactive)    The MD has indicated that the patient is ready for discharge.  Emilio Aspen was referred and accepted at [facility]. [facility] [phone]) for skilled nursing to assist with drain care.      Transfer Mode/Level of Care: Family    The plan has been reviewed:     Patient/Family Informed of Discharge Plan: Yes    Plan Reviewed With Patient, Family, or Significant Other: Yes    Patient and or family are aware and in agreement with the discharge plan: Yes             Plan reviewed with MD and other members of the health care team: Yes  Care Plan Completed: Yes    No further SW needs.    This plan has been reviewed with the multi-disciplinary team.     [address], [place], [place]  [phone]

## 2017-03-09 NOTE — Unmapped (Signed)
[facility] ACUTE CARE SURGERY PROGRESS NOTE  Admit Date: 03/04/2017  OR Date: 03/05/2017      Subjective:   No acute events overnight  HIDA with leak of bile along the perc drain catheter with no intra-abd collection  Drain 42 output   Objective:   Vitals:  Temp:  [97.2 ??F (36.2 ??C)-98.4 ??F (36.9 ??C)] 97.8 ??F (36.6 ??C)  Heart Rate:  [67-89] 86  Resp:  [16] 16  BP: (104-150)/(73-95) 107/76  Vitals:    03/09/17 0421   BP: 107/76   Pulse: 86   Resp: 16   Temp: 97.8 ??F (36.6 ??C)   SpO2: 96%         Date 03/08/17 0700 - 03/09/17 0659 03/09/17 0700 - 03/10/17 0659   Shift 0700-1459 1500-2259 2300-0659 24 Hour Total 0700-1459 1500-2259 2300-0659 24 Hour Total   I  N  T  A  K  E   P.O.  240 240 480          P.O.  240 240 480        I.V.  (mL/kg) 346  (4)  0  (0) 346  (4)          I.V. 346  0 346        Shift Total  (mL/kg) 346  (4) 240  (2.8) 240  (2.8) 826  (9.6)       O  U  T  P  U  T   Urine  (mL/kg/hr)  500  (0.7) 300  (0.4) 800  (0.4)          Urine  500 300 800        Drains  275 150 425          Output (mL) (Drain 1 Abdomen Right)  275 150 425        Shift Total  (mL/kg)  775  (9) 450  (5.2) 1225  (14.2)       Weight (kg) 86.2 86.2 86.2 86.2 86.2 86.2 86.2 86.2       Physical Exam:  Gen: Alert and oriented x3, no acute distress  HEENT: NCAT  CV: Regular rate and rhythm  Resp: Even and easy, no respiratory distress  Abd: Soft, non-distended, mild upper abd tenderness to palpation, improving, no masses, midline abdominal drain with bilious OP to drainage bag  Ext: Warm and well perfused  Wound: Incision clean/dry/intact    No results for input(s): WBC, HGB, HCT, MCV, PLT in the last 72 hours.  No results for input(s): NA, K, CL, CO2, PHOS, BUN, CREATININE, CALCIUM in the last 72 hours.  No results for input(s): POCGLU, POCGMD in the last 72 hours.    Invalid input(s): GLU  No results for input(s): BILITOT, AST, ALT, ALKPHOS, GGT, AMYLASE, LIPASE in the last 72 hours.    Invalid input(s): BILIDIR, 5NUC, ALB  No results for  input(s): INR, PROTIME in the last 72 hours.    Current Medications:  Scheduled Medications:    acetaminophen 975 mg Q8H   AMOXicillin-clavulanate 1 tablet 2 times per day   ciprofloxacin HCl 250 mg 2 times per day   fluconazole 400 mg Daily 0900   gabapentin 100 mg TID   heparin (porcine) 5,000 Units 3 times per day   ibuprofen 600 mg TID   lidocaine 1 patch Q24H   melatonin 3 mg Nightly (2100)   senna 1 tablet Nightly (2100)      IV Meds:  PRN Medications:    ondansetron 4 mg Q6H PRN   Or     ondansetron 8 mg Q6H PRN   Or     ondansetron 4 mg Q6H PRN   Or     ondansetron 8 mg Q6H PRN   oxyCODONE 5 mg Q4H PRN   Or     oxyCODONE 10 mg Q4H PRN   proMETHazine 12.5 mg Q6H PRN       Imaging:  Nm Hepatobiliary Imaging    Result Date: 03/08/2017  NM HEPATOBILIARY IMAGING dated 03/08/2017 12:01 PM EDT CLINICAL: Cholecystitis / cholangitis, known, follow up; bile leak; RADIOPHARMACEUTICAL: Tc-30m mebrofenin 4.45 mCi IV TECHNICAL:   Hepatobiliary system imaging was performed. SPECT-CT imaging was performed to the upper abdomen. FINDINGS: Sequential hepatobiliary images reveal prompt radionuclide uptake by the liver which appears normal in size and configuration.  Uptake by the liver is uniform.  There is rapid excretion of the radionuclide from the liver with good visualization of the intra and extra hepatic biliary tree.  There is free passage of the radionuclide into the duodenum. There is reflux of activity into the stomach. Activity drains along the right upper quadrant percutaneous drainage catheter. No intra-abdominal collections of radiotracer are demonstrated. Low-dose CT images demonstrate a left pleural effusion as well as small fluid collections along the greater curvature of the stomach and the splenic hilum.     IMPRESSION: Leak of bile along the percutaneous drainage catheter with no intra-abdominal collection demonstrated. Report Verified by: Jaye Beagle, M.D. at 03/08/2017 3:00 PM EDT        Assessment /  Plan:   Diane Stafford is a 31 y.o. female  who presented to the ED with history of Lap Chole (7/9) complicated by ERCP with biliary stent placement for bile leak (7/11) at OSH w/recurrence of biloma and underwent drain placement by OSH IR on 7/15.  She is s/p treatment w/Cipro&Flagyl for 7 days    Bile Leak / Biloma after lap chole at OSH  - ERCP with biliary stent placed at OSH 7/11.  - Perc drain placement 7/15 s/p 7 days of Cipro and Flagyl at OSH  - Continued increased bilious OP; s/p ERCP at [facility] on 7/29 with [name]     - Prior biliary stent was removed   - Bile leak persistent to two short cystic duct remnants: placed 8x80 metal biliary stent  - Discussed with IR; unable to drain any fluid collections  - Perc drain output  - HIDA with leak of bile along the perc drain catheter with no intra-abd collection  - Tolerating diet  - Continue cipro/augmentin/diflucan PO  - Drain teaching today  - Cdiff negative   - Multimodal pain control: scheduled tylenol, lidoderm, neurontin, prn oxy  - bowel regimen: senna bid    DVT prophylaxis: Subcutaneous heparin  Dispo: Floor, home today with home health       Elias Else, CNP  Department of Surgery  Division of Trauma, Surgical Critical Care and Acute Care Surgery   Pager:[phone]

## 2017-03-09 NOTE — Unmapped (Signed)
REFERRAL FOR HOME HEALTH SERVICES FORM     Patient name: Diane Stafford  Patient MRN: [id number]  DOB: 06/20/1986  Age: 31 y.o.  Gender: female  SSN: [id number]  Address: [address] [place] [address]     Phone number: [phone] (home)    Patient emergency contact: Extended Emergency Contact Information  Primary Emergency Contact: Dyson,Joshua  Address: [address]           [place], [place] [address] [place]  Home Phone: [phone]  Mobile Phone: [phone]  Relation: Significant other    Date of admission: 03/04/2017  Date of discharge: 03/09/2017  Attending provider: Jeani Hawking, MD  Primary care physician: Santiago Glad (Inactive)    Code status: Full Code  Allergies: No Known Allergies    Insurance Information     Insurance Information                [place] MA[place]ED MEDICAID/HUMANA [place] MANAGED MEDICAID Phone:     Subscriber: Rhyan, Wolters Subscriber#: [id number]    Group#:  Precert#:           Diagnoses Present on Admission   Primary Diagnosis: Abscess of abdominal cavity (CMS Dx)   following recent laparoscopic cholecystectomy, bile leak and biloma with percutaneous drain placement at [facility]  ??  Procedure(s) performed: ERCP with bilary stent removal and replacement with wallflex metal bilary stent performed on 7/29    Discharge Diagnosis :   Active Hospital Problems    Diagnosis Date Noted   ??? Abscess of abdominal cavity (CMS Dx) [K65.1] 03/04/2017      Resolved Hospital Problems    Diagnosis Date Noted Date Resolved   No resolved problems to display.     Prognosis: good  Rehabilitation potential: good    Diet     Diet Orders          Diet regular starting at 08/01 1625           Regular Diet    Services Required   Skilled Nursing    Weight bearing status: full    Needs 24 hour supervision due to cognitive impairment: No    Discharge Medications   Medications:  Current Discharge Medication List      START taking these medications    Details      acetaminophen (TYLENOL) 325 MG tablet Take 3 tablets (975 mg total) by mouth every 8 hours as needed for Pain.  Qty: 100 tablet, Refills: 0      fluconazole (DIFLUCAN) 200 MG tablet Take 2 tablets (400 mg total) by mouth daily for 4 days.  Qty: 8 tablet, Refills: 0      gabapentin (NEURONTIN) 100 MG capsule Take 1 capsule (100 mg total) by mouth 3 times a day.  Qty: 42 capsule, Refills: 0      ibuprofen (ADVIL,MOTRIN) 600 MG tablet Take 1 tablet (600 mg total) by mouth every 8 hours as needed for Pain.  Qty: 30 tablet, Refills: 0      melatonin 3 mg Tab Take 1 tablet (3 mg total) by mouth at bedtime as needed (sleep). Indications: SLEEP  Qty: 30 each, Refills: 0      oxyCODONE (ROXICODONE) 5 MG immediate release tablet Take 1 tablet (5 mg total) by mouth every 4 hours as needed for Pain for up to 7 days.  Qty: 30 tablet, Refills: 0    Associated Diagnoses: Abscess of abdominal cavity (CMS Dx)  senna (SENOKOT) 8.6 mg tablet Take 1 tablet by mouth 2 times a day.  Qty: 30 tablet, Refills: 0    Comments: Continue while on narcotics. Hold for loose stool         CONTINUE these medications which have CHANGED    Details   AMOXicillin-clavulanate (AUGMENTIN) 875-125 mg per tablet Take 1 tablet by mouth every 12 hours for 9 doses.  Qty: 9 tablet, Refills: 0      ciprofloxacin HCl (CIPRO) 250 MG tablet Take 1 tablet (250 mg total) by mouth every 12 hours for 9 doses.  Qty: 9 tablet, Refills: 0         CONTINUE these medications which have NOT CHANGED    Details   ALBUTEROL SULFATE (PROVENTIL HFA INHL) as needed.      famotidine (PEPCID) 20 MG tablet 20 mg 2 times a day.      PRENATAL VIT/IRON FUMARATE/FA (PRENATAL ORAL)       sertraline (ZOLOFT) 50 MG tablet Take 50 mg by mouth daily.         STOP taking these medications       metronidazole (FLAGYL ORAL) Comments:   Reason for Stopping:         miscellaneous medical supply Misc Comments:   Reason for Stopping:                   Discharge Specific Orders   Discharge  specific orders:  RESPIRATORY:  Incentive spirometer ten times per hour while awake.  ELIMINATION:  Monitor for urinary retention, urinary incontinence and outputs daily  Monitor for regular bowel movements  LINES, TUBES AND DRAINS:  Percutaneous drain: Empty, measure, and record drain output daily  WOUND CARE: Your incision was dressed with dermabond glue which is healed and dissolving  ACTIVITY:  Activity as tolerated  Out of bed to chair three times a day as tolerated  Ambulate three times a day  May shower on day of discharge. Wash incision gently with soap and water and pat dry. Do not soak incisions in bath water or swim while drain is in place  Isolation     Patient Isolation Status     Isolation Added Added By Removed Removed By    Contact Plus 03/05/17 Tawni Millers, RN 03/06/17 Levora Dredge, CNP    Use for C. difficile          Vitals     Patient Vitals for the past 4 hrs:   BP Temp Temp src Pulse Resp SpO2   03/09/17 0829 121/88 97.9 ??F (36.6 ??C) Oral 91 16 97 %       Equipment/Supplies   Drain care supplies: measuring cups, gloves, alcohol wipes  No current labs  Ordering Physician: NPI  Jeani Hawking, MD]    Physician Certification   Further, I certify that my clinical findings support that this patient is homebound (i.e. absences from home require considerable and taxing effort and are for medical reasons or religious services or infrequently or short duration when for other reasons) due to deconditioning it would be a taxing effort to receive outpatient services.    My signature below is to certify that this patient is under my care and that I, or nurse practitioner, or a physician assistant working with me, had a face-to-face encounter with this is patient on: 03/09/2017     Follow-up Appointments and [facility] Discharge Physician Name     Future Appointments  Date Time Provider Department Center  03/22/2017 1:30 PM [facility] OP OP     Interventional Radiology  [facility]  [facility] Floor  Radiology Department  [address]  [place], [place] [address]  # [phone]    Interventional Radiology will contact you in 2 weeks to schedule a follow u abscessogram and drain study. If you don't hear from them please call the above number to schedule.       Discharging Physician Signature and Credentials   Discharging Physician: Electronically signed by Beecher Mcardle, CNP/Dr. Pritts  03/09/2017, 11:32 AM    Physician to follow up Information   PCP: Santiago Glad (Inactive)  PCP address: None  PCP phone number: None  PCP fax number: None    If PCP is not following patient, type physician contact information here:               Acute Care Surgery Attending:  Dr. Webb Laws  Acute Care Surgery Clinic  [address]  [place], [place] [address]  [phone]  Fax #[phone]  Emergencies Only:  [phone] & ask for the Acute Care Surgery Resident on call        Discharge Planner and Credentials     Provider/Company Name and Contact Number:          Home Health Company Name: [facility]  [phone]     Home Health Company Contact Number:  [phone]                                     Discharge Planner Name and Telephone Number: Emily Filbert [phone]

## 2017-03-09 NOTE — Telephone Encounter (Signed)
Received a call to ACS hotline @ 16:00 PM that patient is unable to get her narcotic script filled due to her [employer] Medicaid insurance and NP Mayford Knife being listed as the provider. Patient's pharmacy states it will only fill 3 days worth of Oxycodone for her. Patient was able to have the other scripts filled. Clinician apologized for this inconvenience, explained that narcotic scripts can't be called in over the phone to a pharmacy. Offered to have attending signed script placed at the front desk for patient to pick up tonight or tomorrow morning. Patient states she doesn't want to do this since [place]. Encouraged patient to call back if she changes her mind for pain medication issue to be resolved tomorrow morning.     Margaretmary Lombard, RN  Acute Care Surgery Nurse Clinician  Phone:[phone]  Pager:[phone]

## 2017-03-09 NOTE — Unmapped (Signed)
Acute Care Surgery Nurse Clinician Discharge Note    Patient: Diane Stafford  Address: [address] [facility] [place] [address]  Insurance Information: Payor: [place] MANAGED MEDICAID / Plan: HU[employer]NA [employer] [place] MANAGED MEDICAID / Product Type: Medicaid Mng[place] care /      Diagnosis:Bile leak [K83.9]   Procedure(s):  ERCP       Discharge plan:  Anticipate discharge to Home with resumption of home health care today.  Discussed discharge plan with patient, family, social work.  Acute Care Surgery discharge instructions added to discharge navigator.    Met with patient at bedside while she was resting in bed. Updated on current plan of care including plan to discharge home today with resumption of home health care for drain care. SW is following and will set up home care. Reviewed discharge instructions and follow up appointment information with patient. Patient is requesting follow up with [facility] doctors only and does not plan to return to [facility] where her initial surgery and procedures were performed. Patient is requesting work note which CNP Williams completed. Acute Care Surgery Brochure and business card were given to the patient/family. No further needs or questions at this time.    Follow-up:  Appointment with General Surgery Clinic added to discharge navigator.    IR follow up will be needed for drain care, ambulatory referral was placed by CNP Williams on 7/31. IR will call to schedule, patient given follow up information to contact IR if she doesn't hear from them in 1-2 weeks.     GI follow up appointment was scheduled for 9/13 @ 7:30 AM for repeat ERCP/choleangiogram with stent removal with Dr. Floreen Comber.     Follow up with their PCP as scheduled, prn.  Future Appointments  Date Time Provider Department Center   03/22/2017 1:30 PM [facility] OP OP          Margaretmary Lombard, RN  Acute Care Surgery Nurse Clinician  Phone:[phone]  Pager:[phone]

## 2017-03-09 NOTE — Unmapped (Signed)
Acute Care Surgery Discharge Instructions    Diagnosis:  Abscess of abdominal cavity following recent laparoscopic cholecystectomy, bile leak and biloma with percutaneous drain placement at [facility]. Diane Stafford    Procedure(s) performed: ERCP with bilary stent removal and replacement with wallflex metal bilary stent performed on 7/29    Activity:    Do not lift more than 10 pounds for 2 weeks post-operatively                 Light activity  Get out of bed and walk frequently    Return to work:   May return to work as tolerated         Diet:   Regular diet     Drain/Dressing/Wound Care:  Empty, measure, and record drain output daily  Your incision was dressed with dermabond glue which is healed and dissolving.  May shower on day of discharge. Wash incision gently with soap and water and pat dry. Do not soak incisions in bath water or swim while drain is in place    Provider/Company Name and Contact Number:      Home Health Company Name: [facility] Contact Number: [phone]    Gasteroenterology Follow up appointment 9/13 Arrive @ 6:30 AM for 7:30 AM appointment for repeat ERCP with stent removal and choleangiogram    GI- Dr Floreen Comber  [facility], 2nd floor  Diagnostic Center- Endoscopy suite  [address]  [place], [place] [address]  # [phone]    Special instructions for the procedure:   1) Nothing to eat or drink past midnight the night before your procedure.   2) Ok to take your antihypertensive (blood pressure) medications the morning of your procedure with a small sip of water.  3) Stop taking your anticoagulation (blood thinners, aspirin) and NSAIDS (Ibuprofen, Naprosyn) 5 days before your procedure.  4) It is ok to take Tylenol for pain relief.  5) The total time you should expect to be at the hospital is 5 hours, please have a family member or friend with you to drive you home.    Other Instructions:    Incentive spirometer (IS) - 10 times an hour while awake.  Cough and deep  breathe.    If you are being discharged with medication to take as needed for pain, requests for refills may not be addressed at night or on the weekend.    If you are in severe pain, not relieved by your medication, please call or return to the Emergency Room.    No driving if taking narcotic pain medications    If you have questions after discharge please call the  Acute Care Surgery Hotline at [phone]  Identify yourself as an ACUTE CARE SURGERY patient and ask for the ACUTE CARE SURGERY RESIDENT on call.    Please call or return to the emergency room if you experience any of the following:    Worsening pain, nausea, or vomiting not relieved by medications  Temperature greater than 101 F. Fever/chills  Redness around incision/wound, drainage, or wound edge separation  Chest pain, shortness of breath, persistent dizziness, swelling in one or both legs    Future Appointments  Date Time Provider Department Center   03/22/2017 1:30 PM [facility] OP OP     **Surgery follow up appointment with Acute Care Surgery is in the Outpatient Center, located on the 2nd floor    Directions to [place] of  [facility] Outpatient Center  Parking Garage  Patients and visitors may park in the [place] [facility] parking garage.  The garage entrance is on [facility], across from the [employer].  [facility] is directly accessible from [employer].  Complimentary parking vouchers are available at the lobby information desk in the [facility] and at the registration desks at all clinics in the Outpatient Center for patients and the patient visitors.  The hospital assumes no responsibility for vehicles or their contents when parked at our facilities.  For more information, contact the Parking and Engineer, agricultural at [phone].    Valet Parking  Full-service valet parking is available at the [facility], [address], Monday through Friday 5a.m. - 9  p.m. and [facility], [address], Monday through Friday 10 a.m. - 2 p.m. Valet parking is offered to patients and visitors for $4.    After You Have Parked  Proceed to the main Hospital, [address] A, via the connector on garage level P2 (Red)  -Proceed to the Main Lobby  -Follow the red circle ???breadcrumbs??? on the wall to the tunnel  -Continue to follow the red circle ???breadcrumbs??? on the wall through the tunnel to stay on the public path to the Outpatient Center  OR  -Proceed to [facility] F1 via the connector on garage Level P3 (Blue)  -Proceed to the porch  -Turn right and follow the path to the Outpatient Center located on the left    Shuttle Service  For you convenience, [employer] provides a shuttle service to connect you to the Outpatient Center, Shuttle Stop 6 - [address]. The shuttle operates Monday throught Friday 6 a.m. - 8:30 p.m.; stops made every 20 minutes.  You may take the shuttle from the [facility] entrance [facility] A). For more information, call the Parking and Hydrographic surveyor Office at [phone].    Patient Drop Off at [facility])  Turn left onto the one-way drive directly accessible from [employer] [address] is accessible from [facility].) to arrive at the Outpatient Center, [address].  If you need assistance getting to your destination, please stop by the information desk located in the lobby of the [facility] where one of our associates will help guide you to the Outpatient Center.

## 2017-03-09 NOTE — Unmapped (Signed)
Drain teaching completed.

## 2017-03-09 NOTE — Unmapped (Signed)
Patient discharged to home per MD order. RN completed drain teaching and went over all discharge instructions, patient verbalized an understanding. Paper prescriptions given to patient. Patient transported to [facility] to wait for ride.

## 2017-03-09 NOTE — Unmapped (Signed)
[facility]  Inpatient Surgery Discharge Summary     Patient ID:  Diane Stafford [id number]  [id number]:[id number]  [id number]    Admit Service: Acute Care Surgery   Admit date: 03/04/2017  Discharge date and time: 03/09/2017 11:34 AM  Admitting Physician: Jeani Hawking, MD   Discharge Physician: Dr. Audie Box  Admission Condition: fair  Discharged Condition: good    Indication for Admission:   Management of biloma and bile leak following laparoscopic cholecystectomy    Primary Admission Diagnosis:   Postoperative intra-abdominal abscess, sequela (CMS Dx) [Y86.4XXS, K65.1]    Secondary Admission Diagnoses / Past Medical Problems:  Patient Active Problem List   Diagnosis   ??? Urinary tract infection, site not specified   ??? Obesity   ??? 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 Routine postpartum follow-up   ??? Previous cesarean delivery, antepartum condition or complication   ??? Abscess of abdominal cavity (CMS Dx)       Discharge New Diagnoses/Injuries:   Same    Operations/Procedures Performed:  ERCP placement of stent    Consultants:   Gastroenterology for ERCP    Brief Hospital Course:   Diane Stafford is a 31 year old female who presented following at laparoscopic cholecystectomy on 7/9 at an outside hospital, which was complicated by ERCP with stent placement for bile leak on 7/11, with biloma and drain placement on 7/15. She presented to [facility] for second opinion on 7/28 after symptoms failed to resolve.      Bile Leak  Upon arrival patient complained of decreased oral intake secondary to bilious vomiting and nausea, with right upper quadrant pain persistent for the past 5 days. Her previously placed biloma drain was putting out 400-500cc daily. Patient underwent ERCP by gastroenterology on hospital day 2, for evaluation of previous stent placement. Evaluation during ERCP revealed two short cystic duct remnants and a continued bile leak. A new stent  was placed to cover the remaining bile leak. Percutaneous drain output diminished on hospital day 2. On hospital day 3 patient complained of increased pain and inability to tolerate PO intake, and she underwent HIDA scan on hospital day 4 which demonstrated bile leak along percutaneous drainage catheter with no intra-abdominal collection. Patient diet was advanced and she tolerated well. She will follow up with GI in 6-8 weeks for stent removal. She will be discharged with her previously placed drain. Drain teaching completed and she will follow up in 2 weeks for abscessogram with IR. Home health arranged.       Abdominal abscesses  Multiple fluid collections seen on CT imaging including, near left kidney, inferior to stomach, and  posterior to uterus. Primary team discussed draining of fluid collections with IR, however it was determined that collections were unable to be drained appropriately with invasive intervention. Patient initially placed on zosyn-diflucan. She was switched to oral augmentin, ciprofloxacin and diflucan on hospital day 3 to prepare for discharge planning. She will complete a total of 7 days of antibiotics.      Pain Control  Patient presented with persistent abdominal pain. She was started on IV tylenol and IV dilaudid PRN for pain control. Pain was difficult to control and neurontin, lidocaine patches, and toradol were initiated. Prior to discharge she was transitioned to tylenol, ibuprofen, prn oxycodone, and scheduled neurontin. She will continue to wean the oxycodone at discharge.     At time of discharge, the patient was tolerating oral  food and hydration, voiding spontaneously, had return of bowel function, was ambulating without difficulty, and pain was controlled on oral medications. The patient was determined to be suitable for discharge and the patient felt comfortable with that decision. Patient was discharged in stable condition.    Disposition: Home with Home Health      Patient  Instructions:   Allergies: No Known Allergies       Medication List      TAKE these medications, which are NEW      Quantity/Refills   acetaminophen 325 MG tablet  Commonly known as:  TYLENOL  Take 3 tablets (975 mg total) by mouth every 8 hours as needed for Pain.   Quantity:  100 tablet  Refills:  0     fluconazole 200 MG tablet  Commonly known as:  DIFLUCAN  Take 2 tablets (400 mg total) by mouth daily for 4 days.  Start taking on:  03/11/2017   Quantity:  8 tablet  Refills:  0     gabapentin 100 MG capsule  Commonly known as:  NEURONTIN  Take 1 capsule (100 mg total) by mouth 3 times a day.   Quantity:  42 capsule  Refills:  0     ibuprofen 600 MG tablet  Commonly known as:  ADVIL,MOTRIN  Take 1 tablet (600 mg total) by mouth every 8 hours as needed for Pain.   Quantity:  30 tablet  Refills:  0     melatonin 3 mg Tab  Take 1 tablet (3 mg total) by mouth at bedtime as needed (sleep). Indications: SLEEP   Quantity:  30 each  For:  SLEEP  Refills:  0     oxyCODONE 5 MG immediate release tablet  Commonly known as:  ROXICODONE  Take 1 tablet (5 mg total) by mouth every 4 hours as needed for Pain for up to 7 days.   Quantity:  30 tablet  Refills:  0     senna 8.6 mg tablet  Commonly known as:  SENOKOT  Take 1 tablet by mouth 2 times a day.   Quantity:  30 tablet  Refills:  0        TAKE these medication, which have CHANGED      Quantity/Refills   AMOXicillin-clavulanate 875-125 mg per tablet  Commonly known as:  AUGMENTIN  Take 1 tablet by mouth every 12 hours for 9 doses.  What changed:  additional instructions   Quantity:  9 tablet  Refills:  0     ciprofloxacin HCl 250 MG tablet  Commonly known as:  CIPRO  Take 1 tablet (250 mg total) by mouth every 12 hours for 9 doses.  What changed:  ?? medication strength  ?? how much to take  ?? when to take this   Quantity:  9 tablet  Refills:  0        TAKE these medications, which you were ALREADY TAKING      Quantity/Refills   famotidine 20 MG tablet  Commonly known as:   PEPCID  20 mg 2 times a day.   Refills:  0     PRENATAL ORAL   Refills:  0     PROVENTIL HFA INHL  as needed.   Refills:  0     sertraline 50 MG tablet  Commonly known as:  ZOLOFT  Take 50 mg by mouth daily.   Refills:  0        STOP taking these medications    FLAGYL  ORAL     miscellaneous medical supply Misc           Where to Get Your Medications      You can get these medications from any pharmacy    Bring a paper prescription for each of these medications  ?? acetaminophen 325 MG tablet  ?? AMOXicillin-clavulanate 875-125 mg per tablet  ?? ciprofloxacin HCl 250 MG tablet  ?? fluconazole 200 MG tablet  ?? gabapentin 100 MG capsule  ?? ibuprofen 600 MG tablet  ?? melatonin 3 mg Tab  ?? oxyCODONE 5 MG immediate release tablet  ?? senna 8.6 mg tablet       ??        Discharge Specific Orders   Discharge specific orders:  RESPIRATORY:  Incentive spirometer ten times per hour while awake.  ELIMINATION:  Monitor for urinary retention, urinary incontinence and outputs daily  Monitor for regular bowel movements  LINES, TUBES AND DRAINS:  Percutaneous drain: Empty, measure, and record drain output daily  WOUND CARE: Your incision was dressed with dermabond glue which is healed and dissolving  ACTIVITY:  Activity as tolerated  Out of bed to chair three times a day as tolerated  Ambulate three times a day  May shower on day of discharge. Wash incision gently with soap and water and pat dry. Do not soak incisions in bath water or swim while drain is in place        Follow-up:  Future Appointments  Date Time Provider Department Center   03/22/2017 1:30 PM [facility] OP     Interventional Radiology  [facility]  Radiology Department  [address]  [place], [place] [address]  # [phone]    Interventional Radiology will contact you in 2 weeks to schedule a follow u abscessogram and drain study. If you don't hear from them please call the above number to schedule.     Beecher Mcardle, CNP  Department of  Surgery  Division of Trauma, Surgical Critical Care and Acute Care Surgery   Pager:[phone]  Academic Office: [phone]  Trauma NP pager: [phone]

## 2017-03-09 NOTE — Nursing Note (Signed)
Scheduled Ibuprofen due. Patient declines, rates her pain 6/10. Without IV access. Expresses she does not want a PIV placed. Bilious liquid noted within abdominal drain.

## 2017-03-10 NOTE — Telephone Encounter (Addendum)
Received call from Margaretmary Lombard Acute Care Nurse calling to schedule repeat ERCP due 6-8 weeks from ERCP done on 03/05/17. Procedure date/time and prep/arrival info to be included in Discharge papers. 04/20/17 7:30am. Also mailed procedure confirmation and prep instructions to patient.      Recommendation:      - Return patient to hospital ward for ongoing care.                       - Repeat ERCP in 6-8 weeks for stent removal and repeat                        cholangiogram. Contact Chancy Milroy at [phone] to                        schedule.                       - Monitor symptoms and output from the percutaneous                        drain.

## 2017-03-22 ENCOUNTER — Ambulatory Visit: Admit: 2017-03-22 | Payer: Medicaid (Managed Care)

## 2017-03-22 DIAGNOSIS — K838 Other specified diseases of biliary tract: Secondary | ICD-10-CM

## 2017-03-22 NOTE — Unmapped (Addendum)
-  Follow up with [name] in 2 weeks: Appointment on August 27th at 2 pm in Dr[facility] clinic [facility]   -A referral has been made for IR to study your drain. They should call you in about 2 weeks to schedule an appointment. If they have not please bring this up when you see [name]   -Please arrive at 1:30 pm to the 2nd floor diagnostic center at [facility] on 03/23/17. Nothing to eat or drink after 7:30 am. Ok to have light liquid breakfast prior to 7:30 am (protein shake/juice/ etc)

## 2017-03-22 NOTE — Unmapped (Signed)
Reviewed test & AVS instructions with patient, states good understanding.

## 2017-03-22 NOTE — Unmapped (Signed)
Hunt call from [facility] -     Diane Stafford in the outpatient surgery area is calling to schedule an appt for the pt. I informed him that you might have stepped away from your desk or you might be in clinic with patients. He expressed understanding.    Please return his call as soon as possible. I also provided him with your backline.

## 2017-03-22 NOTE — Unmapped (Signed)
Chief Complaint:    Chief Complaint   Patient presents with   ??? New Patient Visit/ Consultation     Bile leak       Subjective   HPI:   Patient ID: Diane Stafford is a 31 y.o. female with PMH of laparoscopic cholecystectomy 7/9 at [facility] c/b bile leak s/p Stafford with biliary stent placement 7/11 with biloma recurrence s/p placement of IR drain on 7/15 all at OSH. She presented to the ED at [facility] for RUQ pain, bilious emesis and bilious drainage from her drain. After admission to [facility] for continued bilious emesis/drainage from tube, she had a repeat Stafford showing two cystic duct remnants with leakage with removal of existing biliary stent and replacement with new stent. She was discharged on 8/2 with about 400-500 cc of bilious drainage/day from perc drain tolerating diet with decreased pain.     Diane Stafford presents to the office after hospital discharge on 8/2. She continues to have about 700 cc bilious, light green drainage per day from her percutaneous drain. She states this is increased. She also has new RUQ pain as well as continued pain around her percutaneous drainage site. She denies N/V. She is eating and drinking well. No fever. Increased number of bowel movements, normal consistency. No diarrhea. She does not some decreased urine output but has been trying to increase her PO fluid intake to compensate. She reports she feels fatigued.       Allergies  Patient has no known allergies.    Medications  Outpatient Encounter Prescriptions as of 03/22/2017   Medication Sig Dispense Refill   ??? acetaminophen (TYLENOL) 325 MG tablet Take 3 tablets (975 mg total) by mouth every 8 hours as needed for Pain. 100 tablet 0   ??? ibuprofen (ADVIL,MOTRIN) 600 MG tablet Take 1 tablet (600 mg total) by mouth every 8 hours as needed for Pain. 30 tablet 0   ??? ALBUTEROL SULFATE (PROVENTIL HFA INHL) as needed.     ??? famotidine (PEPCID) 20 MG tablet 20 mg 2 times a day.     ??? gabapentin (NEURONTIN) 100 MG capsule Take 1 capsule  (100 mg total) by mouth 3 times a day. 42 capsule 0   ??? melatonin 3 mg Tab Take 1 tablet (3 mg total) by mouth at bedtime as needed (sleep). Indications: SLEEP 30 each 0   ??? PRENATAL VIT/IRON FUMARATE/FA (PRENATAL ORAL)      ??? senna (SENOKOT) 8.6 mg tablet Take 1 tablet by mouth 2 times a day. 30 tablet 0   ??? sertraline (ZOLOFT) 50 MG tablet Take 50 mg by mouth daily.       No facility-administered encounter medications on file as of 03/22/2017.         Histories  She has a past medical history of Menarche.    She has a past surgical history that includes Wisdom tooth extraction (2010); Cesarean section; Cholecystectomy; Stafford; and Stafford (N/A, 03/05/2017).    Her family history includes Alzheimer's disease in her maternal grandmother; Aneurysm in her maternal grandfather; Heart disease in her maternal grandmother; Hypertension in her maternal grandfather, maternal grandmother, and other; Mental illness in her other; Other in her other.    She reports that she has quit smoking. She has never used smokeless tobacco. She reports that she drinks alcohol. She reports that she does not use drugs.        ROS:   Review of Systems   Constitutional: Positive for fatigue. Negative for  appetite change and fever.   Respiratory: Negative for chest tightness and shortness of breath.    Cardiovascular: Negative for chest pain.   Gastrointestinal: Positive for abdominal pain. Negative for abdominal distention, bloating, diarrhea, nausea and vomiting.   Genitourinary:        Decreased urine output    Musculoskeletal: Negative for myalgias.   Neurological: Positive for weakness. Negative for dizziness, light-headedness and headaches.   All other systems reviewed and are negative.      Objective:   Physical Exam   Constitutional: She is oriented to person, place, and time. She appears well-developed and well-nourished.   HENT:   Head: Normocephalic and atraumatic.   Eyes: Conjunctivae are normal. Pupils are equal, round, and reactive to  light.   Neck: Normal range of motion. Neck supple.   Cardiovascular: Normal rate and regular rhythm.    Pulmonary/Chest: Effort normal and breath sounds normal.   Abdominal: Soft. She exhibits no distension.   Some TTP in RUQ; tenderness and some mild erythema around drain site. No TTP in rest of abdomen.    Musculoskeletal: Normal range of motion.   Neurological: She is alert and oriented to person, place, and time.   Skin: Skin is warm and dry.   Psychiatric: She has a normal mood and affect. Her behavior is normal.          Assessment/Plan:   Diane Stafford is a 31 yo woman with PMH lap chole 7/9 c/b bile leak with Stafford, stent placement further complicated by biloma s/p IR drainage all at OSH. She was admitted to [facility] on 7/28 for unresolved bile leakage and pain where she underwent repeat Stafford with removal and replacement of biliary stent and was discharged with percutaneous drain in place and about 400 cc of bilious drainage. She presents to the office with continued 700 cc/day bilious drainage and RUQ pain.     Plan:   - Stafford with Dr. Floreen Comber tomorrow at 3 pm. Instructions for preparation and arrival given to patient today in office.  - Will follow up in Dr. Beverly Sessions office on August 27th at 2 pm.  - Encouraged to continue aggressive hydration for slightly decreased UOP.         Attending Documentation/Addendum    I saw and examined the patient on 03/22/2017.  I discussed with the resident, fellow, or NP and agree with the findings and plan as documented in the note with the following additions/revisions:    Increased drainage from IR subhepatic drain.  Now draining 700 per day.  Highly suspicious for non functioning CBD stent.  She will need both IR and GI follow up.  I think she needs GI follow up more promptly than 9/13 to evaluate stent.  I discussed this with Dr. Dwan Bolt who is in complete agreement and has adjusted his schedule to accommodate Diane Stafford tomorrow afternoon.  Following  this, she will need IR follow up.      While the care in the clinic is always staffed by attendings, I would prefer for Diane Stafford to follow up in my office (or one of my partners) to maintain continuity of care at the attending level.  Her case is complicated and will require long term consistency of course.  She is willing to follow up with me in the [facility].  Arrangements have been made.    This documentation is for service provided on 03/22/2017.    Mellody Drown P Coni Homesley  03/22/2017  4:22 PM

## 2017-03-23 ENCOUNTER — Ambulatory Visit: Admit: 2017-03-23 | Payer: Medicaid (Managed Care)

## 2017-03-23 ENCOUNTER — Observation Stay
Admit: 2017-03-23 | Discharge: 2017-03-24 | Disposition: A | Discharge status: Home Health | Payer: Medicaid (Managed Care) | Source: Ambulatory Visit

## 2017-03-23 MED ORDER — lidocaine (PF) 20 mg/mL (2 %) Soln
20 | INTRAVENOUS | Status: AC | PRN
Start: 2017-03-23 — End: 2017-03-23
  Administered 2017-03-23: 20:00:00 100 via INTRAVENOUS

## 2017-03-23 MED ORDER — iothalamate meglumine (CONRAY) 60 % injection
60 | INTRAMUSCULAR | Status: AC | PRN
Start: 2017-03-23 — End: 2017-03-23
  Administered 2017-03-23: 22:00:00 50

## 2017-03-23 MED ORDER — dexamethasone (DECADRON) injection
4 | INTRAMUSCULAR | Status: AC | PRN
Start: 2017-03-23 — End: 2017-03-23
  Administered 2017-03-23: 20:00:00 4 via INTRAVENOUS

## 2017-03-23 MED ORDER — sterilewaterPFSoln
INTRAMUSCULAR | Status: AC
Start: 2017-03-23 — End: 2017-03-24

## 2017-03-23 MED ORDER — ondansetron (ZOFRAN) injection
4 | INTRAMUSCULAR | Status: AC | PRN
Start: 2017-03-23 — End: 2017-03-23
  Administered 2017-03-23: 20:00:00 4 via INTRAVENOUS

## 2017-03-23 MED ORDER — lactated Ringers infusion
INTRAVENOUS | Status: AC | PRN
Start: 2017-03-23 — End: 2017-03-23
  Administered 2017-03-23: 20:00:00 via INTRAVENOUS

## 2017-03-23 MED ORDER — glucagon HCl 1 mg injection
1 | INTRAMUSCULAR | Status: AC
Start: 2017-03-23 — End: 2017-03-24

## 2017-03-23 MED ORDER — iothalamate meglumine (CONRAY) 60 % injection
60 | INTRAMUSCULAR | Status: AC
Start: 2017-03-23 — End: 2017-03-24

## 2017-03-23 MED ORDER — succinylcholine (QUELICIN) injection
20 | INTRAMUSCULAR | Status: AC | PRN
Start: 2017-03-23 — End: 2017-03-23
  Administered 2017-03-23: 20:00:00 100 via INTRAVENOUS

## 2017-03-23 MED ORDER — AMPicillin-sulbactam (UNASYN) 3 g in sodium chloride 0.9% 100 mL ADDaptor IVPB
3 | Freq: Once | INTRAMUSCULAR | Status: AC
Start: 2017-03-23 — End: 2017-03-23
  Administered 2017-03-23: 21:00:00 3 g via INTRAVENOUS

## 2017-03-23 MED ORDER — acetaminophen (TYLENOL) tablet 650 mg
325 | Freq: Once | ORAL | Status: AC
Start: 2017-03-23 — End: 2017-03-23
  Administered 2017-03-23: 23:00:00 650 mg via ORAL

## 2017-03-23 MED ORDER — sodium chloride 0.9 % infusion
INTRAVENOUS | Status: AC
Start: 2017-03-23 — End: 2017-03-24

## 2017-03-23 MED ORDER — propofol 10 mg/ml (DIPRIVAN) injection
10 | INTRAVENOUS | Status: AC | PRN
Start: 2017-03-23 — End: 2017-03-23
  Administered 2017-03-23: 20:00:00 200 via INTRAVENOUS

## 2017-03-23 MED ORDER — fentaNYL (SUBLIMAZE) injection
50 | INTRAMUSCULAR | Status: AC | PRN
Start: 2017-03-23 — End: 2017-03-23
  Administered 2017-03-23: 21:00:00 50 via INTRAVENOUS
  Administered 2017-03-23: 20:00:00 100 via INTRAVENOUS
  Administered 2017-03-23 (×2): 25 via INTRAVENOUS

## 2017-03-23 MED ORDER — heparin (porcine) injection 5,000 Units
5000 | Freq: Three times a day (TID) | INTRAMUSCULAR | Status: AC
Start: 2017-03-23 — End: 2017-03-24

## 2017-03-23 MED ORDER — acetaminophen (TYLENOL) 325 MG tablet
325 | ORAL | Status: AC
Start: 2017-03-23 — End: 2017-03-24

## 2017-03-23 MED ORDER — lidocaine 40 mg/mL (4 %) injection
40 | INTRAMUSCULAR | Status: AC | PRN
Start: 2017-03-23 — End: 2017-03-23
  Administered 2017-03-23: 20:00:00 100 via INTRATRACHEAL

## 2017-03-23 MED ORDER — dextrose 5 % and 0.45 % NaCl infusion 1000 mL
INTRAVENOUS | Status: AC
Start: 2017-03-23 — End: 2017-03-24

## 2017-03-23 MED ORDER — sodium chloride flush 10 mL
INTRAMUSCULAR | Status: AC
Start: 2017-03-23 — End: 2017-03-24

## 2017-03-23 MED FILL — TYLENOL 325 MG TABLET: 325 325 mg | ORAL | Qty: 2

## 2017-03-23 MED FILL — GLUCAGON HCL 1 MG/ML SOLUTION FOR INJECTION: 1 1 mg/mL | INTRAMUSCULAR | Qty: 1

## 2017-03-23 MED FILL — AMPICILLIN-SULBACTAM 3 GRAM SOLUTION FOR INJECTION: 3 3 gram | INTRAMUSCULAR | Qty: 1

## 2017-03-23 MED FILL — CONRAY 60 % INJECTION SOLUTION: 60 60 % | INTRAMUSCULAR | Qty: 100

## 2017-03-23 MED FILL — WATER FOR INJECTION, STERILE INJECTION SOLUTION: INTRAMUSCULAR | Qty: 10

## 2017-03-23 NOTE — Anesthesia Post-Procedure Evaluation (Signed)
Anesthesia Post Note    Patient: Diane Stafford    Procedure(s) Performed: Procedure(s):  ERCP w/ possible stent exhange    Anesthesia type: No value filed.    Patient location: Endoscopy PACU    Post pain: Adequate analgesia    Post assessment: no apparent anesthetic complications    Last Vitals:   Vitals:    03/23/17 1345 03/23/17 1401 03/23/17 1800 03/23/17 1815   BP: 102/76  138/55 122/88   BP Location: Left arm  Right arm Right arm   Patient Position: Lying  Lying Lying   Pulse: 110  100 100   Resp: 16  16 16    Temp: 96.8 F (36 C)  98 F (36.7 C)    TempSrc: Oral  Oral    SpO2: 100%  100% 99%   Weight:  190 lb (86.2 kg)     Height:  5' 1 (1.549 m)          Post vital signs: stable    Level of consciousness: awake, alert  and oriented    Complications: None

## 2017-03-23 NOTE — Unmapped (Signed)
Anesthesia Extubation Criteria:    Airway Device: endotracheal tube    Emergence Details:      Smooth      _x_      Stormy       __       Prolonged   __     Extubation Criteria:      Motor strength intact       _x_      Follows commands        _x_      Good airway reflexes      _x_      OP suctioned                  _x_        Follows commands:  Yes     Patient extubated:  Yes

## 2017-03-23 NOTE — Unmapped (Signed)
[facility] HEALTH  DEPARTMENT OF INTERNAL MEDICINE  HISTORY & PHYSICAL  Patient: Diane Stafford  MRN: [id number]  CSN: [id number]    Chief Complaint     Bile duct leak    History of Present Illness     HPI   Diane Stafford is a 31 year old female with a PMH of laparoscopic cholecystectomy 7/9 at [facility] hospital complicated by  bile leak s/p ERCP with biliary stent placement 7/11 with biloma recurrence s/p placement of IR drain on 7/15 all at OSH who is presenting with abdominal pain. Bilious drainage from her percutaneous drain increased to 700 cc/ day from 400 to 500 cc/ day from discharge at OSH. She was admitted to [facility] where repeat ERCP was performed showing two cystic duct remnants with leakage; existing biliary stent was removed and replacement with new fully covered biliary metal stent. Per patient, she had her gallbladder removed in July and started experiencing bile leakage after. Stents were placed and she has been having increased yellow drainage and pain since beginning of August. Abdominal pain located in RUQ and described as constant, pressure-like, 6/10, minimally relieved with Tylenol or Ibuprofen, not exacerbated by eating. Denies fevers, chills, night sweats, nausea, vomiting, constipation, decreased appetite, or diarrhea. She has a repeat ERCP scheduled for September.       Review of Systems     Review of Systems   Constitutional: Negative for activity change, appetite change, fatigue and fever.   HENT: Negative for congestion and rhinorrhea.    Eyes: Negative for photophobia, redness and visual disturbance.   Respiratory: Negative for cough, shortness of breath and wheezing.    Cardiovascular: Negative for chest pain, palpitations and leg swelling.   Gastrointestinal: Positive for abdominal pain. Negative for abdominal distention, blood in stool, constipation, diarrhea, nausea and vomiting.   Genitourinary: Positive for difficulty urinating. Negative for dysuria, frequency, hematuria and urgency.    Musculoskeletal: Negative for arthralgias, back pain, myalgias, neck pain and neck stiffness.   Skin: Negative for color change and wound.   Neurological: Negative for dizziness, weakness, light-headedness and headaches.   Psychiatric/Behavioral: Negative for agitation and behavioral problems.       Past Medical History     Past Medical History:   Diagnosis Date   ??? Menarche     age 69       Past Surgical History     Past Surgical History:   Procedure Laterality Date   ??? CESAREAN SECTION      x 4   ??? CHOLECYSTECTOMY     ??? ERCP     ??? ERCP N/A 03/05/2017    Procedure: ERCP;  Surgeon: Floreen Comber, MD;  Location: [facility];  Service: Gastroenterology;  Laterality: N/A;   ??? WISDOM TOOTH EXTRACTION  2010    two       Family History     Family History   Problem Relation Age of Onset   ??? Heart disease Maternal Grandmother      aorta valve problem   ??? Hypertension Maternal Grandmother    ??? Alzheimer's disease Maternal Grandmother    ??? Aneurysm Maternal Grandfather      brain   ??? Hypertension Maternal Grandfather    ??? Mental illness Other      aunt-psych issues   ??? Other Other      2 uncles had open heart surgery   ??? Hypertension Other      two uncles  Social History     Social History     Social History   ??? Marital status: Single     Spouse name: N/A   ??? Number of children: N/A   ??? Years of education: N/A     Occupational History   ??? Not on file.     Social History Main Topics   ??? Smoking status: Former Smoker   ??? Smokeless tobacco: Never Used      Comment: 11-24-2009   ??? Alcohol use Yes      Comment: occ   ??? Drug use: No      Comment: 11-24-2009   ??? Sexual activity: Not Currently     Other Topics Concern   ??? Not on file     Social History Narrative   ??? No narrative on file       Medications     Allergies:  No Known Allergies    Home Meds:  Prior to Admission medications as of 03/23/17 1357   Medication Sig Taking?   acetaminophen (TYLENOL) 325 MG tablet Take 3 tablets (975 mg total) by mouth every 8 hours as needed  for Pain.    ALBUTEROL SULFATE (PROVENTIL HFA INHL) as needed.    famotidine (PEPCID) 20 MG tablet 20 mg 2 times a day.    gabapentin (NEURONTIN) 100 MG capsule Take 1 capsule (100 mg total) by mouth 3 times a day.    ibuprofen (ADVIL,MOTRIN) 600 MG tablet Take 1 tablet (600 mg total) by mouth every 8 hours as needed for Pain.    melatonin 3 mg Tab Take 1 tablet (3 mg total) by mouth at bedtime as needed (sleep). Indications: SLEEP    PRENATAL VIT/IRON FUMARATE/FA (PRENATAL ORAL)     senna (SENOKOT) 8.6 mg tablet Take 1 tablet by mouth 2 times a day.    sertraline (ZOLOFT) 50 MG tablet Take 50 mg by mouth daily.        Inpatient Meds:  Scheduled:  ??? acetaminophen       ??? glucagon HCl       ??? heparin (porcine)  5,000 Units Subcutaneous 3 times per day   ??? iothalamate meglumine       ??? sodium chloride  10 mL Intravenous QS   ??? sterile water         Continuous:  ??? dextrose 5 % and 0.45 % NaCl     ??? sodium chloride 0.9 %         PRN:    Vital Signs     Temp:  [96.8 ??F (36 ??C)-98 ??F (36.7 ??C)] 98 ??F (36.7 ??C)  Heart Rate:  [100-110] 100  Resp:  [16] 16  BP: (102-138)/(55-88) 122/88  FiO2:  [81 %-90 %] 87 %    Intake/Output Summary (Last 24 hours) at 03/23/17 2141  Last data filed at 03/23/17 1749   Gross per 24 hour   Intake              450 ml   Output                0 ml   Net              450 ml       Physical Exam     Physical Exam   Constitutional: She is oriented to person, place, and time. She appears well-developed and well-nourished. No distress.   HENT:   Head: Normocephalic and atraumatic.   Mouth/Throat: Oropharynx is  clear and moist.   Eyes: Conjunctivae and EOM are normal. Pupils are equal, round, and reactive to light. No scleral icterus.   Neck: Normal range of motion. Neck supple. No thyromegaly present.   Cardiovascular: Normal rate and regular rhythm.  Exam reveals friction rub. Exam reveals no gallop.    No murmur heard.  Pulmonary/Chest: Effort normal and breath sounds normal. She has no wheezes. She  has no rales.   Abdominal: Soft. Bowel sounds are normal. She exhibits no distension. There is tenderness (right upper quadrant/midepigastric). There is no rebound and no guarding.   Musculoskeletal: Normal range of motion. She exhibits no edema or tenderness.   Neurological: She is alert and oriented to person, place, and time.   Skin: Skin is warm and dry. No rash noted. No erythema.   Psychiatric: She has a normal mood and affect. Her behavior is normal.       Laboratory Data       Diagnostic Studies     Fluoro ERCP 03/23/17  Impression:          - Successful removal of the previously placed covered                        metal biliary stent. The stent had not migrated and                        appeared patent.                       - Cholangiogram images revealed an abberrant take-off of                        the right posteror hepatic duct from the common hepatic                        duct. 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 No leak was seen from the adjacent                        cystic duct remnant as was thought previously during the                        prior ERCP on 03/05/2017. The right anterior, left                        hepatic, and left intrahepatic ducts appeared normal.                       - A guidewire was successfully passed upstream to the                        site of the right posterior duct injury and contrast                        injection revealed filling of small right posterior                        intrahepatic branches which were not seen previously.                       -  The right posterior duct stricture/leak was                        successfully dilated using a 4 mm biliary dilation                        balloon. A 7 Fr x 12 cm plastic biliary stent was placed                        across the site of the leak. The intraductal end of the                        stent extends into a small right intrahepatic branch                         with the opposite end extending into the duodenum. A 10                        Fr x 7 cm plastic stent was placed in the common duct to                        help anchor the 7 Fr stent to prevent migration.      Assessment & Plan     Diane Stafford is a 31 y.o. female with Bile duct leak. Medical problems being addressed in this encounter include the following:    Active Hospital Problems    Diagnosis Date Noted   ??? Bile duct leak [K83.8] 03/22/2017   ??? Bile leak, postoperative [K91.89, K83.8] 03/23/2017      Resolved Hospital Problems    Diagnosis Date Noted Date Resolved   No resolved problems to display.       #Abdominal pain: Hx of laparoscopic cholecystectomy 7/9 complicated by bile leak s/p ERCP with biliary stent placement 7/11 with biloma recurrence s/p placement of IR drain on 7/15. Repeat ERCP and stent replacement at [facility]; results revealed an abberrant take-off of??the right posteror hepatic duct from the common hepatic duct. A stricture and bile leak was seen in the right posterior duct. Patient has been afebrile during this hospitalization. No evidence of leukocytosis on CBC.   - Take ciprofloxacin 500 mg po BID for 10 days for infection prophylaxis  - Encourage patient to monitor output from the percutaneous drain.  - Follow up with referring physician as previously scheduled- Repeat ERCP with stent removal/ exchange in 8 to 10 weeks if percutaneous drain ouput decreases (patient has appointment scheduled for September).         Nutrition:   Diet Orders          Diet regular starting at 08/16 2123          Code Status: Full Code    Signed:  Karen Chafe, MD  03/23/2017, 9:41 PM

## 2017-03-23 NOTE — Unmapped (Signed)
Anesthesia Transfer of Care Note    Patient: Diane Stafford  Procedure(s) Performed: Procedure(s):  ERCP w/ possible stent exhange    Patient location: Endoscopy PACU    Anesthesia type: No value filed.    Airway Device on Arrival to PACU/ICU: Nasal Cannula    IV Access: Peripheral    Monitors Recommended to be Used During PACU/ICU: Standard Monitors    Outstanding Issues to Address: None    Level of Consciousness: awake, alert  and oriented    Post vital signs:    Vitals:    03/23/17 1757   BP: 138/55   Pulse: 101   Resp: 16   Temp: 97.8 ??F    SpO2: 100%       Complications: None      Date 03/22/17 1500 - 03/23/17 0659(Not Admitted) 03/23/17 0700 - 03/24/17 0659   Shift 1500-2259 2300-0659 24 Hour Total 0700-1459 1500-2259 2300-0659 24 Hour Total   I  N  T  A  K  E   I.V.     450  (5.2)  450  (5.2)      Volume (mL) (lactated Ringers infusion)     450  450    Shift Total  (mL/kg)     450  (5.2)  450  (5.2)   O  U  T  P  U  T   Shift Total  (mL/kg)          Weight (kg)    86.2 86.2 86.2 86.2

## 2017-03-23 NOTE — Unmapped (Signed)
[id number]  _______________________________________________________________________________  Procedure Date: 03/23/2017 4:06 PM     Patient Name: Diane Stafford  MRN: [id number]                         Account Number: [id number]  Date of Birth: 1986/02/06              Admit Type: Outpatient  Age: 31                               Gender: Female  Note Status: Finalized                Attending MD: [name] , MD  _______________________________________________________________________________     Procedure:           ERCP  Indications:         The patient is a 31 y.o. who is s/p laparoscopic                        cholecystectomy on 02/13/2017 at [facility]                        The procedure was complicated by bile leak and biloma.                        She underwent ERCP with plastic biliary stent placement                        on 02/15/2017 by Dr. Estil Daft. She also underwent                        percutaneous drain placement for the biloma on                        02/19/2017. She was later admitted to [facility] acute care                        surgery service for RUQ pain, bilious emesis and                        persistent bilious drainage the percutaneous drain. ERCP                        at [facility] on 03/05/2017 revealed what appeared to be two                        cystic duct remnants with extravasation of injected                        contrast in the region, suspected to represent a cytic                        duct remnant leak. An 8 mm x 80 mm fully covered                        Wallflex biliary metal stent was placed. However, output                        from the  percutaneous drain increased to 700 mL/day from                        about 400 mL/day previously. She presents today for                        repeat ERCP for further evaluation and management.  Providers:           [name], MD, Kathryne Gin, MD (Fellow)  Referring MD:        Eulis Manly. Sheliah Mends, MD  Medicines:            Unasyn 3 g IV, General Anesthesia  Complications:       No immediate complications.  _______________________________________________________________________________  Procedure:           Pre-Anesthesia Assessment:                       - Prior to the procedure, a History and Physical was                        performed, and patient medications and allergies were                        reviewed. The patient is competent. The risks and                        benefits of the procedure and the sedation options and                        risks were discussed with the patient. All questions                        were answered and informed consent was obtained. Patient                        identification and proposed procedure were verified by                        the physician, the nurse and the anesthesiologist in the                        pre-procedure area in the procedure room. Mental Status                        Examination: alert and oriented. Airway Examination:                        normal oropharyngeal airway and neck mobility.                        Respiratory Examination: clear to auscultation. CV                        Examination: normal. Prophylactic Antibiotics: The                        patient requires prophylactic antibiotics as clinically                        indicated based on published guidelines  for the planned                        ERCP. Prior Anticoagulants: The patient has taken no                        previous anticoagulant or antiplatelet agents. ASA Grade                        Assessment: II - A patient with mild systemic disease.                        After reviewing the risks and benefits, the patient was                        deemed in satisfactory condition to undergo the                        procedure. The anesthesia plan was to use general                        anesthesia. Immediately prior to administration of                        medications, the patient was  re-assessed for adequacy to                        receive sedatives. The heart rate, respiratory rate,                        oxygen saturations, blood pressure, adequacy of                        pulmonary ventilation, and response to care were                        monitored throughout the procedure. The physical status                        of the patient was re-assessed after the procedure.                       After obtaining informed consent, the scope was passed                        under direct vision. Throughout the procedure, the                        patient's blood pressure, pulse, and oxygen saturations                        were monitored continuously. The Duodenoscope was                        introduced through the mouth, and advanced to the                        duodenum and used to inject contrast into the bile duct.  The ERCP was unusually difficult due to post-surgical                        anatomy. The patient tolerated the procedure fairly well.                                                                                   Findings:       The procedure was performed with the patient in a supine position. A        scout filim revealed a metal biliary stent in the RUQ which did not        appear migrated. A pigtail percutaneous drain was also seen. Endoscopic        views of the stomach and duodenum were normal. The metal stent was seen        extending from the major papilla. The stent appeared patent and was not        migrated. The stent was easily extracted using a foreign body forceps.        The bile duct was cannulated using a RX 39 sphinctretome preloaded with        a 0.025-inch Visiglide guidewire. The orifice of the major papilla was        widely patent, compatible with a prior biliary sphincterotomy. Initial        balloon occlusion cholangiogram images obtained using Conray revealed        normal left intrahepatic branches and right anterior  branches. The right        posterior branches were not seen. Further contrast injection as the        inflated balloon was pulled distally from the biliary confluence again        revealed two short duct extending from the mid-common duct. Careful        examination of the images led [place] to suspect that the more distal duct        represented the cystic duct remnant whereas the more cephalad duct        likely represented the take-off of the right posterior duct from the        common hepatic duct. Extravasation of injected contrast was seen from        the more cephalad duct (i.e. the right posterior duct). The presumed        right posterior duct was then probed with the guidewire. The guidewire        initially passed caudally through the site of the bile leak. We were        subsequently able to advanced the wire in a more cephalad direction into        what appeared to be a small duct just above the percutaneous biliary        drain. Attempts to advance the sphinecterotome over the wire resulted in        kinking and angulation of the wire. Therefore, the sphinecterotome was        exchanged for a 9-12 mm stone extraction balloon catheter which        permitted deeper advancement of the  guidewire upstream to the leak.        Contrast injection via the balloon resulted in filling of the right        posterior branches which had not filled previously today or on the prior        ERCP on 03/05/2017. The right posterior stricture/leak was dilated using        a 4 mm x 4 cm [employer] biliary dilation balloon. Following        dilation, a 7 Fr x 12 cm plastic (Cotton-Leung) biliary stent was placed        across the site of the right posterior stricture/leak with the        intraductal end of the stent extending into a small right intrahepatic        branch. A 10 Fr x 7 cm plastic stent was placed in the common duct with        a goal of anchoring the 7 Fr stent to prevent migration. .                                                                                    Impression:          - Successful removal of the previously placed covered                        metal biliary stent. The stent had not migrated and                        appeared patent.                       - Cholangiogram images revealed an abberrant take-off of                        the right posteror hepatic duct from the common hepatic                        duct. 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 No leak was seen from the adjacent                        cystic duct remnant as was thought previously during the                        prior ERCP on 03/05/2017. The right anterior, left                        hepatic, and left intrahepatic ducts appeared normal.                       - A guidewire was successfully passed upstream to the  site of the right posterior duct injury and contrast                        injection revealed filling of small right posterior                        intrahepatic branches which were not seen previously.                       - The right posterior duct stricture/leak was                        successfully dilated using a 4 mm biliary dilation                        balloon. A 7 Fr x 12 cm plastic biliary stent was placed                        across the site of the leak. The intraductal end of the                        stent extends into a small right intrahepatic branch                        with the opposite end extending into the duodenum. A 10                        Fr x 7 cm plastic stent was placed in the common duct to                        help anchor the 7 Fr stent to prevent migration.  Recommendation:      - Patient has a contact number available for                        emergencies. The signs and symptoms of potential delayed                        complications were discussed with the patient. Return to                         normal activities tomorrow. Written discharge                        instructions were provided to the patient.                       - Discharge patient to home (with escort).                       - Follow up with referring physician as previously                        scheduled.                       - Take ciprofloxacin 500 mg po BID for 10 days for  infection prophylaxis                       - Monitor output from the percutaneous drain.                       - Repeat ERCP with stent removal/ exchange in 8 to 10                        weeks if percutaneous drain ouput decreases. Otherwise,                        consider other management options. Note: The findings                        were discussed with [name] by telephone following                        the procedure.                                                                                   Procedure Code(s):   --- Professional ---                       [phone], GC, Endoscopic retrograde                        cholangiopancreatography (ERCP); diagnostic, including                        collection of specimen(s) by brushing or washing, when                        performed (separate procedure)  Diagnosis Code(s):   --- Professional ---                       K83.9, Disease of biliary tract, unspecified                       K83.8, Other specified diseases of biliary tract    CPT copyright 2016 [employer]. All rights reserved.    The codes documented in this report are preliminary and upon coder review may   be revised to meet current compliance requirements.  Attending Participation:       I was present and participated during the entire procedure, including        non-key portions.                                                                                     __________________  [name], MD  03/23/2017 8:11:08 PM  This report has been signed electronically.Floreen Comber ,  MD    ________________  Kathryne Gin, MD  Total Procedure Duration Time 1 hour 22 minutes 10 seconds   Scope In: 4:19:39 PM  Scope Out: 5:41:49 PM         [address] [place], [place] [id number]

## 2017-03-23 NOTE — Unmapped (Signed)
Pre-sedation Endoscopy History and Physical      03/23/2017  2:18 PM    Diane Stafford is an 31 y.o. female who is here for a   ERCP     Reason for exam:Ms.  Stafford is a 31 y.o. female with PMH of laparoscopic cholecystectomy 7/9 at [facility] complicated by  bile leak s/p ERCP with biliary stent placement 7/11 with biloma recurrence s/p placement of IR drain on 7/15 all at OSH. She was admitted at [facility] for RUQ pain, bilious emesis and bilious drainage from her drain and had a repeat ERCP showing two cystic duct remnants with leakage with removal of existing biliary stent and replacement with new fully covered biliary metal stent. How ever, her bilious drainage from her percutaneous drain increased to 700 cc/ day from 400 to 500 cc/ day from discharge.     Patient Active Problem List   Diagnosis   ??? Urinary tract infection, site not specified   ??? Obesity   ??? 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 Routine postpartum follow-up   ??? Previous cesarean delivery, antepartum condition or complication   ??? Abscess of abdominal cavity (CMS Dx)   ??? Bile leak   ??? Bile duct leak       History:  Past Medical History:   Diagnosis Date   ??? Menarche     age 67     Past Surgical History:   Procedure Laterality Date   ??? CESAREAN SECTION      x 4   ??? CHOLECYSTECTOMY     ??? ERCP     ??? ERCP N/A 03/05/2017    Procedure: ERCP;  Surgeon: Floreen Comber, MD;  Location: [facility];  Service: Gastroenterology;  Laterality: N/A;   ??? WISDOM TOOTH EXTRACTION  2010    two     Family History   Problem Relation Age of Onset   ??? Heart disease Maternal Grandmother      aorta valve problem   ??? Hypertension Maternal Grandmother    ??? Alzheimer's disease Maternal Grandmother    ??? Aneurysm Maternal Grandfather      brain   ??? Hypertension Maternal Grandfather    ??? Mental illness Other      aunt-psych issues   ??? Other Other      2 uncles had open heart surgery   ???  Hypertension Other      two uncles     Social History   Substance Use Topics   ??? Smoking status: Former Smoker   ??? Smokeless tobacco: Never Used      Comment: 11-24-2009   ??? Alcohol use Yes      Comment: occ     No Known Allergies      Home Medications:   Prescriptions Prior to Admission   Medication Sig Dispense Refill Last Dose   ??? acetaminophen (TYLENOL) 325 MG tablet Take 3 tablets (975 mg total) by mouth every 8 hours as needed for Pain. 100 tablet 0 Unknown at Unknown time   ??? ALBUTEROL SULFATE (PROVENTIL HFA INHL) as needed.   Unknown at Unknown time   ??? famotidine (PEPCID) 20 MG tablet 20 mg 2 times a day.   Unknown at Unknown time   ??? gabapentin (NEURONTIN) 100 MG capsule Take 1 capsule (100 mg total) by mouth 3 times a day. 42 capsule 0 Unknown at Unknown time   ??? ibuprofen (ADVIL,MOTRIN)  600 MG tablet Take 1 tablet (600 mg total) by mouth every 8 hours as needed for Pain. 30 tablet 0 Unknown at Unknown time   ??? melatonin 3 mg Tab Take 1 tablet (3 mg total) by mouth at bedtime as needed (sleep). Indications: SLEEP 30 each 0 Unknown at Unknown time   ??? PRENATAL VIT/IRON FUMARATE/FA (PRENATAL ORAL)    Unknown at Unknown time   ??? senna (SENOKOT) 8.6 mg tablet Take 1 tablet by mouth 2 times a day. 30 tablet 0 Unknown at Unknown time   ??? sertraline (ZOLOFT) 50 MG tablet Take 50 mg by mouth daily.   Unknown at Unknown time       ROS:  Chest pain?  No  Shortness of breath?   No  Recent illness?   Yes  Prior Anesthesia?   Yes  Prior reaction to Anesthesia?   No    Review of Nursing History/Assessment:  Yes    IV site verified?   Yes    Vitals:  Blood pressure 102/76, pulse 110, temperature 96.8 ??F (36 ??C), temperature source Oral, resp. rate 16, height 5' 1 (1.549 m), weight 190 lb (86.2 kg), last menstrual period 02/15/2017, SpO2 100 %.    Physical Exam:  Neuro:  Alert and Oriented x 4, Follows commands  Cardio:  Regular rate and rhythm, no murmurs/rub/gallop  Pulm:  Clear to auscultation bilaterally, no  wheezes, rhonchi, rub  Abdomen:  Soft, nontender, nondistended.  Positive bowel sounds.    Airway Assessment:  within normal limits    Mallampati Classification:  III (soft and hard palate and base of uvula visible)    ASA Classification:  ASA 3 - Patient with moderate systemic disease with functional limitations    Relevant diagnostic tests and images reviewed:   Yes    Medication Reconciliation Reviewed:  Yes     Labs:   Lab Results   Component Value Date    GLUCOSE 91 03/09/2017    BUN 11 03/09/2017    CO2 26 03/09/2017    CREATININE 0.87 03/09/2017    K 4.2 03/09/2017    NA 138 03/09/2017    CL 102 03/09/2017    CALCIUM 10.2 03/09/2017     Lab Results   Component Value Date    WBC 11.3 (H) 03/09/2017    HGB 13.8 03/09/2017    HCT 43.3 03/09/2017    MCV 83.9 03/09/2017    PLT 392 03/09/2017     No results found for: PTT, INR    Assessment/Plan:  ERCP     Sedation plan (type/medications): Deep Sedation general anesthesia    Site Marked:  Not Applicable    Patient / Surrogate has consented for the above procedure.  Yes     This patient is in satisfactory condition for the procedure and sedation/anesthesia.   Yes     This patient was reevaluated immediately prior to procedure/sedation.  Any changes are noted below.   Yes       Diane Stafford  03/23/2017

## 2017-03-24 DIAGNOSIS — K9189 Other postprocedural complications and disorders of digestive system: Secondary | ICD-10-CM

## 2017-03-24 MED ORDER — ciprofloxacin HCl (CIPRO) tablet 500 mg
500 | Freq: Two times a day (BID) | ORAL | Status: AC
Start: 2017-03-24 — End: 2017-03-24
  Administered 2017-03-24: 12:00:00 500 mg via ORAL

## 2017-03-24 MED ORDER — oxyCODONE (ROXICODONE) immediate release tablet 5 mg
5 | Freq: Once | ORAL | Status: AC
Start: 2017-03-24 — End: 2017-03-24
  Administered 2017-03-24: 13:00:00 5 mg via ORAL

## 2017-03-24 MED FILL — OXYCODONE 5 MG TABLET: 5 5 MG | ORAL | Qty: 1

## 2017-03-24 MED FILL — CIPROFLOXACIN 500 MG TABLET: 500 500 MG | ORAL | Qty: 1

## 2017-03-24 NOTE — Other (Signed)
[facility]    Social Worker Discharge Summary     Patient name: Diane Stafford                                        Patient MRN: [id number]  DOB: 11/04/1985                              Age: 31 y.o.              Gender: female  Patient emergency contact: Extended Emergency Contact Information  Primary Emergency Contact: Dyson,Joshua  Address: [address]           [place], [place] [address] [place]  Home Phone: [phone]  Mobile Phone: [phone]  Relation: Significant other      Attending provider: No att. providers found  Primary care physician: Santiago Glad (Inactive)    The MD has indicated that the patient is ready for discharge.  Emilio Aspen was referred and accepted for resumption of home health care services for skilled nursing through [facility] agency.  The patient will be transported by metro bus at time of discharge. Pt reported that she had bus passes for [place] (where she lives) but does not have any means of obtaining [place] bus passes [place] get [place]. SW provided pt with two metro bus passes to get pt back to [place] border where she would be able to use her own [place] bus passes to travel the remainder of the distance home. Per medical team, there were no restrictions to or concerns for pt ambulating home and safely using the bus.  Transfer Mode/Level of Care: [employer]    The plan has been reviewed:     Patient/Family Informed of Discharge Plan: Yes    Plan Reviewed With Patient, Family, or Significant Other: Yes    Patient and or family are aware and in agreement with the discharge plan: Yes             Plan reviewed with MD and other members of the health care team: Yes  Care Plan Completed: Yes    No further SW needs.    This plan has been reviewed with the multi-disciplinary team.     [name], MSW, LSW  [phone]

## 2017-03-24 NOTE — Unmapped (Signed)
[facility]  DEPARTMENT OF ANESTHESIOLOGY  PRE-PROCEDURAL EVALUATION    Diane Stafford is a 31 y.o. year old female presenting for:    Procedure(s):  ERCP w/ possible stent exhange    Surgeon:   Floreen Comber, MD    Chief Complaint         Review of Systems     Anesthesia Evaluation    Patient summary reviewed.       No history of anesthetic complications   I have reviewed the History and Physical Exam, any relevant changes are noted in the anesthesia pre-operative evaluation.      Cardiovascular:      (-) hypertension, past MI, CABG/stent, dysrhythmias, angina.    Neuro/Muscoloskeletal/Psych:    Seizures (only as a child. None as adult).    (-) TIA, CVA.     Pulmonary:      (-) COPD, asthma, shortness of breath, recent URI, sleep apnea.       GI/Hepatic/Renal:    GERD (indigestion) is.    (-) renal disease.    Endo/Other:        (-) diabetes mellitus.       Past Medical History     Past Medical History:   Diagnosis Date   ??? Menarche     age 61       Past Surgical History     Past Surgical History:   Procedure Laterality Date   ??? CESAREAN SECTION      x 4   ??? CHOLECYSTECTOMY     ??? ERCP     ??? ERCP N/A 03/05/2017    Procedure: ERCP;  Surgeon: Floreen Comber, MD;  Location: [facility];  Service: Gastroenterology;  Laterality: N/A;   ??? WISDOM TOOTH EXTRACTION  2010    two       Family History     Family History   Problem Relation Age of Onset   ??? Heart disease Maternal Grandmother      aorta valve problem   ??? Hypertension Maternal Grandmother    ??? Alzheimer's disease Maternal Grandmother    ??? Aneurysm Maternal Grandfather      brain   ??? Hypertension Maternal Grandfather    ??? Mental illness Other      aunt-psych issues   ??? Other Other      2 uncles had open heart surgery   ??? Hypertension Other      two uncles       Social History     Social History     Social History   ??? Marital status: Single     Spouse name: N/A   ??? Number of children: N/A   ??? Years of education: N/A     Occupational History   ??? Not on file.     Social  History Main Topics   ??? Smoking status: Former Smoker   ??? Smokeless tobacco: Never Used      Comment: 11-24-2009   ??? Alcohol use Yes      Comment: occ   ??? Drug use: No      Comment: 11-24-2009   ??? Sexual activity: Not Currently     Other Topics Concern   ??? Not on file     Social History Narrative   ??? No narrative on file       Medications     Allergies:  No Known Allergies    Home Meds:  Prior to Admission medications as of 03/23/17 1357   Medication  Sig Taking?   acetaminophen (TYLENOL) 325 MG tablet Take 3 tablets (975 mg total) by mouth every 8 hours as needed for Pain.    ALBUTEROL SULFATE (PROVENTIL HFA INHL) as needed.    famotidine (PEPCID) 20 MG tablet 20 mg 2 times a day.    gabapentin (NEURONTIN) 100 MG capsule Take 1 capsule (100 mg total) by mouth 3 times a day.    ibuprofen (ADVIL,MOTRIN) 600 MG tablet Take 1 tablet (600 mg total) by mouth every 8 hours as needed for Pain.    melatonin 3 mg Tab Take 1 tablet (3 mg total) by mouth at bedtime as needed (sleep). Indications: SLEEP    PRENATAL VIT/IRON FUMARATE/FA (PRENATAL ORAL)     senna (SENOKOT) 8.6 mg tablet Take 1 tablet by mouth 2 times a day.    sertraline (ZOLOFT) 50 MG tablet Take 50 mg by mouth daily.        Inpatient Meds:  Scheduled:     Continuous:       PRN:     Vital Signs     Wt Readings from Last 3 Encounters:   03/23/17 190 lb (86.2 kg)   03/22/17 196 lb (88.9 kg)   03/05/17 190 lb (86.2 kg)     Ht Readings from Last 3 Encounters:   03/23/17 5' 1 (1.549 m)   03/22/17 5' 1 (1.549 m)   03/05/17 5' 1 (1.549 m)     Temp Readings from Last 3 Encounters:   03/23/17 98.4 ??F (36.9 ??C) (Oral)   03/22/17 97.5 ??F (36.4 ??C) (Oral)   03/09/17 97.9 ??F (36.6 ??C) (Oral)     BP Readings from Last 3 Encounters:   03/24/17 (!) 145/99   03/22/17 117/84   03/09/17 121/88     Pulse Readings from Last 3 Encounters:   03/24/17 75   03/22/17 97   03/09/17 91     @LASTSAO2 (3)@    Physical Exam     Airway:     Mallampati: II  Mouth Opening: >2 FB  TM distance: >  = 3 FB  Neck ROM: full  (-) no facial hair, neck not short      Dental:        Pulmonary:       Breath sounds clear to auscultation.       Cardiovascular:     Rhythm: regular  Rate: normal    Neuro/Musculoskeletal/Psych:      Abdominal:       Current OB Status:       Other Findings:        Laboratory Data     Lab Results   Component Value Date    WBC 11.3 (H) 03/09/2017    HGB 13.8 03/09/2017    HCT 43.3 03/09/2017    MCV 83.9 03/09/2017    PLT 392 03/09/2017       No results found for: [facility]    Lab Results   Component Value Date    GLUCOSE 91 03/09/2017    BUN 11 03/09/2017    CO2 26 03/09/2017    CREATININE 0.87 03/09/2017    K 4.2 03/09/2017    NA 138 03/09/2017    CL 102 03/09/2017    CALCIUM 10.2 03/09/2017    ALBUMIN 3.5 03/04/2017    ALBUMIN 3.5 03/04/2017    PROT 7.6 03/04/2017    ALKPHOS 100 03/04/2017    ALT 17 03/04/2017    AST 23 03/04/2017    BILITOT 0.6 03/04/2017  No results found for: PTT, INR    Lab Results   Component Value Date    PREGTESTUR NEGATIVE 03/04/2017    HCGQUANT .64 01/18/2010       Anesthesia Plan     ASA 2       Female and opiate use    Anesthesia Type:  general endotracheal.     (Standard monitoring with PIV)    Intravenous induction.            Plan discussed with CRNA.

## 2017-03-24 NOTE — Unmapped (Signed)
[facility]  Department of Internal Medicine  Inpatient Discharge Summary    Patient: Diane Stafford   MRN: [id number]   CSN: [id number]    Date of Admission: 03/23/2017  Date of Discharge: 03/24/2017  Attending Physician: Tillman Abide, MD       Diagnoses Present on Admission     Past Medical History:   Diagnosis Date   ??? Menarche     age 31        Discharge Diagnoses     Active Hospital Problems    Diagnosis Date Noted   ??? Bile duct leak [K83.8] 03/22/2017   ??? Bile leak, postoperative [K91.89, K83.8] 03/23/2017      Resolved Hospital Problems    Diagnosis Date Noted Date Resolved   No resolved problems to display.       Operations/Procedures Performed (include dates)     Surgeries:  Surgical/Procedural Cases on this Admission     Case IDs Date Procedure Surgeon Location Status    [id number] 03/23/17 ERCP w/ stent exhange Floreen Comber, MD [facility]          Lines/Drains/Airways:  Patient Lines/Drains/Airways Status    Active Line / PIV Line     Name:   Placement date:   Placement time:   Site:   Days:    Peripheral IV 03/23/17 Left Antecubital  03/23/17    2225    Antecubital    less than 1                Notable Imaging Studies:  1. Fluoro ERCP 03/23/17  Impression: ????????????????  - Successful removal of the previously placed covered??metal biliary stent. The stent had not migrated and appeared patent.  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 A stricture and bile leak was seen in the right posterior duct. No leak was seen from the adjacent cystic duct remnant as was thought previously during the prior ERCP on 03/05/2017. The right anterior, left??hepatic, and left intrahepatic ducts appeared normal.  - A guidewire was successfully passed upstream to the site of the right posterior duct injury and contrast injection revealed filling of small right posterior intrahepatic branches which were not seen previously.  - The  right posterior duct stricture/leak was successfully dilated using a 4 mm biliary dilation balloon. A 7 Fr x 12 cm plastic biliary stent was placed across the site of the leak. The intraductal end of the stent extends into a small right intrahepatic branch??with the opposite end extending into the duodenum. A 10 Fr x 7 cm plastic stent was placed in the common duct to help anchor the 7 Fr stent to prevent migration.    Other Procedures:  1. ERCP    Consulting Services (include reason)     1. None    Allergies      No known drug allergies    Discharge Medications        Medication List      TAKE these medications, which you were ALREADY TAKING      Quantity/Refills   acetaminophen 325 MG tablet  Commonly known as:  TYLENOL  Take 3 tablets (975 mg total) by mouth every 8 hours as needed for Pain.   Quantity:  100 tablet  Refills:  0     famotidine 20 MG tablet  Commonly known as:  PEPCID  20 mg 2 times a day.   Refills:  0     gabapentin 100 MG capsule  Commonly known as:  NEURONTIN  Take 1 capsule (100 mg total) by mouth 3 times a day.   Quantity:  42 capsule  Refills:  0     ibuprofen 600 MG tablet  Commonly known as:  ADVIL,MOTRIN  Take 1 tablet (600 mg total) by mouth every 8 hours as needed for Pain.   Quantity:  30 tablet  Refills:  0     melatonin 3 mg Tab  Take 1 tablet (3 mg total) by mouth at bedtime as needed (sleep). Indications: SLEEP   Quantity:  30 each  For:  SLEEP  Refills:  0     PRENATAL ORAL   Refills:  0     PROVENTIL HFA INHL  as needed.   Refills:  0     senna 8.6 mg tablet  Commonly known as:  SENOKOT  Take 1 tablet by mouth 2 times a day.   Quantity:  30 tablet  Refills:  0     sertraline 50 MG tablet  Commonly known as:  ZOLOFT  Take 50 mg by mouth daily.   Refills:  0            Reason for Admission     Diane Stafford??is a 31 y.o female who is s/p laparoscopic cholecystectomy on 02/13/2017 at [facility].  The procedure was complicated by bile leak and biloma. She underwent ERCP with  plastic biliary stent placement on 02/15/2017 by Dr. Estil Daft. She also underwent percutaneous drain placement for the biloma on 02/19/2017. She was later admitted to [facility] acute care surgery service for RUQ pain, bilious emesis and persistent bilious drainage the percutaneous drain. ERCP at [facility] on 03/05/2017 revealed what appeared to be two cystic duct remnants with extravasation of injected contrast in the region, suspected to represent a cytic duct remnant leak. An 8 mm x 80 mm fully covered Wallflex biliary metal stent was placed. However, output from the percutaneous drain increased to 700 mL/day from about 400 mL/day previously. She presented on 8/16 for repeat ERCP for further evaluation and management.    Hospital Course By Problem     1. Abdominal Pain  S/P stent placment for high biliary leak s/p lap chole.  Patient is stable this morning and ok for discharge. Take ciprofloxacin 500 mg po BID for 10 days for infection prophylaxis. Monitor output from the percutaneous drain. Repeat ERCP with stent removal/ exchange in 8 to 10 weeks if percutaneous drain ouput decreases. Otherwise, consider other management options.      Condition on Discharge     1. Functional Status: Normal    2. Mental Status: Normal    3. Diet / Tube Feeding / TPN:  Diet Orders          Diet regular starting at 08/16 2123        As listed above    4. Respiratory / Lines & Tubes / Wounds:  LINES, TUBES AND DRAINS:  Percutaneous drain Care per routine    5. Discharge Physical Exam:  BP 118/80    Pulse 75    Temp 98.4 ??F (36.9 ??C) (Oral)    Resp 21    Ht 5' 1 (1.549 m)    Wt 190 lb (86.2 kg)    LMP 02/15/2017    SpO2 100%    BMI 35.90 kg/m??    BP 118/80    Pulse 75    Temp 98.4 ??  F (36.9 ??C) (Oral)    Resp 21    Ht 5' 1 (1.549 m)    Wt 190 lb (86.2 kg)    LMP 02/15/2017    SpO2 100%    BMI 35.90 kg/m??     General: alert, NAD  Eye: PEERL, conjunctiva clear b/l with no icterus, EOMI  ENT: oropharynx clear without erythema or exudate,  MMM  Neck: supple with normal ROM, no thyromegaly  Lymph: no cervical LAD, no submandibular LAD  CV: RRR, nl S1/S2, no M/R/G, no JVD, no edema, 2+ DP pulses b/l  Pulm: good air movement, normal WOB, CTAB  Abd: soft, tenderness in RUQ/midepigastric region; percutaneous drain in place - site is clean, dry, and pink; bowel sounds active all four quadrants, no masses, no organomegaly  MSK: normal ROM, no joint swelling, no muscle tenderness   Skin: no rashes, no ecchymosis  Neurologic: alert, oriented to self/place/time/situation, no facial asymmetry, no gross focal weakness, answers questions and follows commands appropriately, moves all 4 extremities spontaneously  Psych: normal mood, normal behavior    Core Measure Documentation (As Applicable)     Most Recent Wt: Weight: 190 lb (86.2 kg)    Disposition     Home with Home Health    Follow-Up Appointments     Future Appointments  Date Time Provider Department Center   04/03/2017 2:00 PM Bertram Savin, MD [facility]     No follow-up provider specified.     Patient Instructions / Follow-Up Items for Receiving Physician     1. Ciprofloxacin BID 10 days  2. Pain control with oxycodone 5 q8 PRN x3 days  3. Monitor drain output  4. Repeat ERCP in 8-10 weeks      Signed:    Elsie Amis, MD   03/24/2017, 10:34 AM

## 2017-03-24 NOTE — Unmapped (Signed)
[facility]  Care Management Department    High Risk Screen    Name: Diane Stafford  MRN:  [id number]  Date:   03/24/2017     High Risk Screen  Patient admitted from nursing home, group home or rehab facility: No  Patient is over 32 years of age and lives alone: No  Patient is active with Hospice services: No  Patient is suspected victim of abuse, neglect, violence: No  Current alcohol or drug abuse: No  Patient is homeless: No  Patient is from justice center or on police hold: No  Patient has a new diagnosis of a terminal illness: No  Patient has a new diagnosis of a chronic illness: No  Patient has multiple co-morbidities and/or>5 home medications: Yes  Patient has no PCP or medical home: No  Patient has history of dementia, mental illness or DD: No  Patient has no payer source: No  Patient has previous admission within last 30 days: Yes  Anticipate specialized home health needs, i.e., wounds, trach, cellulitis, ostomy, tube-feeds, TPN, DME: Yes  Score: 7    Assignment of Risk Level  Patient has been screened by Care Management and meets High Risk Indicators, psychosocial assessment to follow. (Score of > 4)    [name], MSW, [place]  [phone]

## 2017-03-24 NOTE — Unmapped (Signed)
Please take your antibiotics twice a day for the next 10 days.           [facility]  ENDOSCOPY INSTRUCTION/RELEASE FORMS    Date: 03/23/2017  Procedure: Procedure(s):  ERCP w/ possible stent exhange    IF YOU DEVELOP EXCESSIVE BLEEDING, UNCONTROLLED PAIN OR SHORTNESS OF BREATH, GO TO THE EMERGENCY DEPARTMENT FOR AN EXAMINATION.  IF YOU DEVELOP CHILLS, FEVER (greater than 100.5) OR HAVE CONCERNS ABOUT YOUR RECOVERY, CALL [name]  AT [phone].    1. You may experience lightheadedness and nausea.  Rest today; no strenuous activity.  2. DO NOT:  ?? Drink alcoholic beverages today.  ?? Make serious financial or legal decisions today.  ?? Operate automobiles, heavy machinery or use any appliances today.  3. Diet:  ?? Resume your diet as tolerated or recommended by your primary care provider.  4. Medications:  ?? The treatment you received today does not change your current medications.  ?? New/revised prescriptions given to patient?: No.  5. Side Effects You May Experience:  ?? Sore throat.  ?? Cramping, gas, bloating, belching.  6. Additional:    Your surgeons will be in touch with you for further care    All Patient Belongings Have Been Returned: _________(patient initials)  I understand and acknowledge receipt of the above instructions.                                                                                                                                          Patient or Guardian Signature                                                         Date/Time                                                                                                                                            Physician's or R.N.'s Signature  Date/Time      The discharge instructions have been reviewed with the patient and/or Guardian.  Patient and/or Guardian signed and retained a printed copy.

## 2017-03-24 NOTE — Unmapped (Signed)
Social Work Psychosocial Assessment     Diane Stafford  [id number]  31 y.o.  female  White or Caucasian  Marital Status: Significant Other/Life Partner    Bile duct leak    Referred by: GI Team  Referred Reason: Discharge Planning      History    Past Medical History:   Diagnosis Date   ??? Menarche     age 61       History   Drug Use No     Comment: 11-24-2009       History   Alcohol Use   ??? Yes     Comment: occ       Mental Health History: Denied by pt    Mental Status    Current Mental Status: Awake, Oriented to Place, Oriented to Time, Oriented to Situation, Oriented to Person  Mental Status Prior to Admission: Awake, Oriented to Person, Oriented to Place, Oriented to Time, Oriented to Situation  Activities of Daily Living: Independent       Current Living Arrangements    Current Living Arrangements: Other (See Comments) (Roommate, Tara)    One Story or Two (check all that apply): Live on first floor  Enter the number of steps and rails to enter the residence: 2 with railing  Enter the number of steps and rails inside the residence: 0    Support Systems    Next of Kin/Contact Person: Jamse Belfast (finace, currently incarcerated)  Next of Kin Relationship:  Significant Other  Next of Kin Phone Number: [phone]    [facility] Used Prior to Admission: No     Cultural/Spiritual/Language Barriers    None reported    Other Pertinent Data    Home Health Agency Name: [facility]  Home Health Agency Phone Number: [phone]    Case Manager for Mental Health IssuesPrior to Admission: No          Durable Medical Equipment Prior to Admission: No    Name/number of PCP: Dr. Helyn Numbers [phone]  Pharmacy[facility] [place]    Assessment/Plan    Per H&P documentation: S/P stent placment for high biliary leak s/p lap chole.  Had no ride home last night.  Stable this am.  OK for discharge.    Care Coordinator/Social Worker Michaelle Copas was consulted by GI team due to  concerns for pt's transportation home. SW met with pt at bedside to complete psychosocial assessment for discharge planning as part of routine care. SW introduced herself and role of SW in hospital. SW verified demographic information. Pt is a 31 year old female, engaged to Jamse Belfast, and living with a roommate named Delice Bison in their first-floor apartment in [place], [place]. Pt denies any concerns for her safety or for abuse. Pt reports she has four children Clinical cytogeneticist, Colton/son, McKynna/daughter, and Research officer, trade union) who live with pt's mother. Pt reports he fiance is currently incarcerated. Pt reports that she works full-time and will return to work upon discharge from hospital. Pt denies any history of or current mental health diagnoses or concerns. PT denies any history of or current suicidal ideation, self-injurious urges or behaviors, homicidal ideation, or audio/visual hallucinations. Pt denies any history of or current tobacco or drug/illicit substance use. Pt reports she drinks alcohol occasionally and reports she will have 1 drink socially around 1x per month. Pt denies any history of skilled nursing facility or inpatient rehabilitation facility admissions. Pt denies any home oxygen, DME  use, or dialysis. Pt reports that she receives skilled nursing through [facility]. [facility] agency. SW faxed complete home care orders for resumption of services to [facility] company. Pt was able to verbalize what a living will and health care power of attorney are but stated she is not interested in completing this paperwork at this time.    Patient/Family aware and taking part in the discharge plan.  Patient and family were offered a post-acute provider list as applicable to the discharge plan and insurance provider.  Patient and family were given the freedom to choose providers and financial interest(s) were disclosed as appropriate.    This assessment has been reviewed with the multi-disciplinary team.    SW  will continue to follow this pt and to communicate with the medical team to assist with discharge planning and to address psychosocial needs as they arise.    [name], MSW, LSW  [phone]

## 2017-03-24 NOTE — Progress Notes (Signed)
Pt d/c'd to home, per bus, via ambulatory, belongings w/ pt, no complaints, VSS, no c/o pain after Oxycodone.

## 2017-03-24 NOTE — Unmapped (Signed)
REFERRAL FOR HOME HEALTH SERVICES FORM     Patient name: Diane Stafford  Patient MRN: [id number]  DOB: 1986/07/25  Age: 31 y.o.  Gender: female  SSN: [id number]  Address: [address] [place] [address]     Phone number: [phone] (home)    Patient emergency contact: Extended Emergency Contact Information  Primary Emergency Contact: Dyson,Joshua  Address: [address]           [place], [place] [address] [place]  Home Phone: [phone]  Mobile Phone: [phone]  Relation: Significant other    Date of admission: 03/23/2017  Date of discharge: 03/24/2017  Attending provider: Tillman Abide, MD  Primary care physician: Santiago Glad (Inactive)    Code status: Full Code  Allergies: No Known Allergies    Insurance Information     Insurance Information                [place] MANAGED MEDICAID/[employer] [place] MANAGED MEDICAID Phone:     Subscriber: Seth, Friedlander Subscriber#: [id number]    Group#:  Precert#:           Diagnoses Present on Admission   Primary Diagnosis: Bile duct leak  Discharge Diagnosis :   Active Hospital Problems    Diagnosis Date Noted   ??? Bile duct leak [K83.8] 03/22/2017   ??? Bile leak, postoperative [K91.89, K83.8] 03/23/2017      Resolved Hospital Problems    Diagnosis Date Noted Date Resolved   No resolved problems to display.     Prognosis: good  Rehabilitation potential: good    Diet     Diet Orders          Diet regular starting at 08/16 2123           As listed above    Services Required   Skilled Nursing    Weight bearing status: full    Needs 24 hour supervision due to cognitive impairment: No    Discharge Medications   Medications:  Current Discharge Medication List      CONTINUE these medications which have NOT CHANGED    Details   acetaminophen (TYLENOL) 325 MG tablet Take 3 tablets (975 mg total) by mouth every 8 hours as needed for Pain.  Qty: 100 tablet, Refills: 0      ibuprofen (ADVIL,MOTRIN) 600 MG tablet Take 1 tablet (600 mg total) by  mouth every 8 hours as needed for Pain.  Qty: 30 tablet, Refills: 0      ALBUTEROL SULFATE (PROVENTIL HFA INHL) as needed.      famotidine (PEPCID) 20 MG tablet 20 mg 2 times a day.      gabapentin (NEURONTIN) 100 MG capsule Take 1 capsule (100 mg total) by mouth 3 times a day.  Qty: 42 capsule, Refills: 0      melatonin 3 mg Tab Take 1 tablet (3 mg total) by mouth at bedtime as needed (sleep). Indications: SLEEP  Qty: 30 each, Refills: 0      PRENATAL VIT/IRON FUMARATE/FA (PRENATAL ORAL)       senna (SENOKOT) 8.6 mg tablet Take 1 tablet by mouth 2 times a day.  Qty: 30 tablet, Refills: 0    Comments: Continue while on narcotics. Hold for loose stool      sertraline (ZOLOFT) 50 MG tablet Take 50 mg by mouth daily.             Discharge Specific Orders   Discharge specific orders:  LINES, TUBES AND DRAINS:  Percutaneous drain Routine care  Isolation      None    Vitals     Patient Vitals for the past 4 hrs:   BP Resp SpO2   03/24/17 0739 118/80 21 100 %       Equipment/Supplies     None    Physician Certification     My signature below is to certify that this patient is under my care and that I, or nurse practitioner, or a physician assistant working with me, had a face-to-face encounter with this is patient on: 03/24/2017     Follow-up Appointments and [facility] Discharge Physician Name     Future Appointments  Date Time Provider Department Center   04/03/2017 2:00 PM Bertram Savin, MD [facility]     No follow-up provider specified.    Discharging Physician Signature and Credentials   Discharging Physician: Electronically signed by Elsie Amis  03/24/2017, 10:31 AM    Physician to follow up Information   PCP: Santiago Glad (Inactive)  PCP address: None  PCP phone number: None  PCP fax number: None    If PCP is not following patient, type physician contact information here:           Patient will be followed by PCP    Discharge Planner and Credentials     Provider/Company Name and Contact Number:

## 2017-03-29 ENCOUNTER — Emergency Department: Admit: 2017-03-29 | Payer: Medicaid (Managed Care)

## 2017-03-29 ENCOUNTER — Inpatient Hospital Stay
Admission: EM | Admit: 2017-03-29 | Discharge: 2017-03-30 | Disposition: A | Discharge status: Home / Self Care | Payer: Medicaid (Managed Care) | Admitting: Gastroenterology

## 2017-03-29 DIAGNOSIS — R1011 Right upper quadrant pain: Principal | ICD-10-CM

## 2017-03-29 LAB — HEPATIC FUNCTION PANEL
ALT: 98 U/L (ref 7–52)
AST: 92 U/L (ref 13–39)
Albumin: 3.9 g/dL (ref 3.5–5.7)
Alkaline Phosphatase: 216 U/L (ref 36–125)
Bilirubin, Direct: 0.1 mg/dL (ref 0.0–0.4)
Bilirubin, Indirect: 0.5 mg/dL (ref 0.0–1.1)
Total Bilirubin: 0.6 mg/dL (ref 0.0–1.5)
Total Protein: 7.7 g/dL (ref 6.4–8.9)

## 2017-03-29 LAB — DIFFERENTIAL
Basophils Absolute: 29 /uL (ref 0–200)
Basophils Relative: 0.5 % (ref 0.0–1.0)
Eosinophils Absolute: 302 /uL (ref 15–500)
Eosinophils Relative: 5.3 % (ref 0.0–8.0)
Lymphocytes Absolute: 2069 /uL (ref 850–3900)
Lymphocytes Relative: 36.3 % (ref 15.0–45.0)
Monocytes Absolute: 604 /uL (ref 200–950)
Monocytes Relative: 10.6 % (ref 0.0–12.0)
Neutrophils Absolute: 2696 /uL (ref 1500–7800)
Neutrophils Relative: 47.3 % (ref 40.0–80.0)
PLT Morphology: NORMAL
nRBC: 0 /100 WBC (ref 0–0)

## 2017-03-29 LAB — BASIC METABOLIC PANEL
Anion Gap: 9 mmol/L (ref 3–16)
BUN: 7 mg/dL (ref 7–25)
CO2: 25 mmol/L (ref 21–33)
Calcium: 9.4 mg/dL (ref 8.6–10.3)
Chloride: 104 mmol/L (ref 98–110)
Creatinine: 0.7 mg/dL (ref 0.60–1.30)
Glucose: 135 mg/dL (ref 70–100)
Osmolality, Calculated: 286 mOsm/kg (ref 278–305)
Potassium: 4.8 mmol/L (ref 3.5–5.3)
Sodium: 138 mmol/L (ref 133–146)
eGFR AA CKD-EPI: >90 See note.
eGFR NONAA CKD-EPI: >90 See note.

## 2017-03-29 LAB — CBC
Hematocrit: 40.8 % (ref 35.0–45.0)
Hemoglobin: 13.5 g/dL (ref 11.7–15.5)
MCH: 27.8 pg (ref 27.0–33.0)
MCHC: 33 g/dL (ref 32.0–36.0)
MCV: 84.2 fL (ref 80.0–100.0)
MPV: 8.6 fL (ref 7.5–11.5)
Platelet Estimate: ADEQUATE
Platelets: 254 10E3/uL (ref 140–400)
RBC: 4.84 10E6/uL (ref 3.80–5.10)
RDW: 16.9 % — ABNORMAL HIGH (ref 11.0–15.0)
WBC: 5.7 10E3/uL (ref 3.8–10.8)

## 2017-03-29 LAB — LIPASE: Lipase: 9 U/L (ref 4–82)

## 2017-03-29 MED ORDER — proMETHazine (PHENERGAN) injection 12.5 mg
25 | Freq: Once | INTRAMUSCULAR | Status: AC
Start: 2017-03-29 — End: 2017-03-29
  Administered 2017-03-30: 12.5 mg via INTRAVENOUS

## 2017-03-29 MED ORDER — oxyCODONE (ROXICODONE) immediate release tablet 10 mg
5 | ORAL | Status: AC | PRN
Start: 2017-03-29 — End: 2017-03-29
  Administered 2017-03-29: 20:00:00 10 mg via ORAL

## 2017-03-29 MED ORDER — heparin (porcine) injection 5,000 Units
5000 | Freq: Three times a day (TID) | INTRAMUSCULAR | Status: AC
Start: 2017-03-29 — End: 2017-03-30

## 2017-03-29 MED ORDER — ondansetron (ZOFRAN) tablet 4 mg
4 | Freq: Three times a day (TID) | ORAL | Status: AC | PRN
Start: 2017-03-29 — End: 2017-03-30
  Administered 2017-03-29: 20:00:00 4 mg via ORAL

## 2017-03-29 MED ORDER — morPHINE injection 4 mg
4 | INTRAVENOUS | Status: AC | PRN
Start: 2017-03-29 — End: 2017-03-30
  Administered 2017-03-30 (×4): 4 mg via INTRAVENOUS

## 2017-03-29 MED ORDER — albuterol (PROVENTIL;VENTOLIN;PROAIR) inhaler 1 puff
90 | Freq: Four times a day (QID) | RESPIRATORY_TRACT | Status: AC | PRN
Start: 2017-03-29 — End: 2017-03-29

## 2017-03-29 MED ORDER — ursodiol (ACTIGALL) capsule 300 mg
300 | Freq: Two times a day (BID) | ORAL | Status: AC
Start: 2017-03-29 — End: 2017-03-30
  Administered 2017-03-30 (×2): 300 mg via ORAL

## 2017-03-29 MED ORDER — ciprofloxacin HCl (CIPRO) tablet 500 mg
500 | Freq: Two times a day (BID) | ORAL | Status: AC
Start: 2017-03-29 — End: 2017-03-30
  Administered 2017-03-29 – 2017-03-30 (×2): 500 mg via ORAL

## 2017-03-29 MED ORDER — ondansetron (ZOFRAN) injection 4 mg
4 | Freq: Once | INTRAMUSCULAR | Status: AC
Start: 2017-03-29 — End: 2017-03-29
  Administered 2017-03-29: 16:00:00 4 mg via INTRAVENOUS

## 2017-03-29 MED ORDER — gabapentin (NEURONTIN) capsule 100 mg
100 | Freq: Three times a day (TID) | ORAL | Status: AC
Start: 2017-03-29 — End: 2017-03-29

## 2017-03-29 MED ORDER — morPHINE injection 2 mg
4 | INTRAVENOUS | Status: AC | PRN
Start: 2017-03-29 — End: 2017-03-30

## 2017-03-29 MED ORDER — senna (SENOKOT) tablet 1 tablet
8.6 | Freq: Two times a day (BID) | ORAL | Status: AC
Start: 2017-03-29 — End: 2017-03-29

## 2017-03-29 MED ORDER — melatonin Tab 3 mg
3 | Freq: Every evening | ORAL | Status: AC | PRN
Start: 2017-03-29 — End: 2017-03-30

## 2017-03-29 MED ORDER — sertraline (ZOLOFT) tablet 50 mg
50 | Freq: Every day | ORAL | Status: AC
Start: 2017-03-29 — End: 2017-03-29

## 2017-03-29 MED ORDER — famotidine (PEPCID) tablet 20 mg
20 | Freq: Two times a day (BID) | ORAL | Status: AC
Start: 2017-03-29 — End: 2017-03-29

## 2017-03-29 MED ORDER — OMNIPAQUE (iohexol) 350 mg iodine/mL 150 mL
350 | Freq: Once | INTRAVENOUS | Status: AC | PRN
Start: 2017-03-29 — End: 2017-03-29
  Administered 2017-03-29: 17:00:00 150 mL via INTRAVENOUS

## 2017-03-29 MED ORDER — acetaminophen (TYLENOL) tablet 975 mg
325 | Freq: Three times a day (TID) | ORAL | Status: AC | PRN
Start: 2017-03-29 — End: 2017-03-30

## 2017-03-29 MED ORDER — ondansetron (ZOFRAN) injection 4 mg
4 | Freq: Three times a day (TID) | INTRAMUSCULAR | Status: AC | PRN
Start: 2017-03-29 — End: 2017-03-30

## 2017-03-29 MED ORDER — lactated Ringers 1,000 mL IV fluid
Freq: Once | INTRAVENOUS | Status: AC
Start: 2017-03-29 — End: 2017-03-29
  Administered 2017-03-29: 16:00:00 1000 mL via INTRAVENOUS

## 2017-03-29 MED ORDER — oxyCODONE (ROXICODONE) immediate release tablet 5 mg
5 | ORAL | Status: AC | PRN
Start: 2017-03-29 — End: 2017-03-29

## 2017-03-29 MED FILL — OXYCODONE 5 MG TABLET: 5 5 MG | ORAL | Qty: 2

## 2017-03-29 MED FILL — ONDANSETRON HCL (PF) 4 MG/2 ML INJECTION SOLUTION: 4 4 mg/2 mL | INTRAMUSCULAR | Qty: 2

## 2017-03-29 MED FILL — CIPROFLOXACIN 500 MG TABLET: 500 500 MG | ORAL | Qty: 1

## 2017-03-29 MED FILL — SENNA LAX 8.6 MG TABLET: 8.6 8.6 mg | ORAL | Qty: 1

## 2017-03-29 MED FILL — MORPHINE 4 MG/ML INTRAVENOUS SOLUTION: 4 4 mg/mL | INTRAVENOUS | Qty: 1

## 2017-03-29 MED FILL — ONDANSETRON HCL 4 MG TABLET: 4 4 MG | ORAL | Qty: 1

## 2017-03-29 MED FILL — SERTRALINE 50 MG TABLET: 50 50 MG | ORAL | Qty: 1

## 2017-03-29 MED FILL — URSODIOL 300 MG CAPSULE: 300 300 mg | ORAL | Qty: 1

## 2017-03-29 MED FILL — PROMETHAZINE 25 MG/ML INJECTION SOLUTION: 25 25 mg/mL | INTRAMUSCULAR | Qty: 1

## 2017-03-29 MED FILL — HEPARIN (PORCINE) 5,000 UNIT/ML INJECTION SOLUTION: 5000 5,000 unit/mL | INTRAMUSCULAR | Qty: 1

## 2017-03-29 MED FILL — OMNIPAQUE 350 MG IODINE/ML INTRAVENOUS SOLUTION: 350 350 mg iodine/mL | INTRAVENOUS | Qty: 150

## 2017-03-29 NOTE — Progress Notes (Signed)
Nursing Day Shift Progress Note    Significant Events During Shift  Pt was admitted to the floor from the ED. Pt is struggling with pain control and MD's are aware.    Patient/Family Concerns  Visitors: none  Concerns: Pain control    Assessment  Nursing time demands: moderate  IV access: has IV access, adequate and functioning  Sitter requirements:  no    Mental Status  No data found.      Medications  [phone] - Medications Not Given  (last 12 hrs)         ** SITE UNKNOWN **       Medication Name Action Time Action Reason Comments     heparin (porcine) injection 5,000 Units 03/29/17 1457 Not Given Patient/family refused      heparin (porcine) injection 5,000 Units 03/29/17 2025 Not Given Patient/family refused

## 2017-03-29 NOTE — Unmapped (Signed)
.                                                      [facility] ED Note    Date of service:  03/29/2017    Reason for Visit: Abdominal Pain      Patient History     HPI:  Diane Stafford is a 31 y.o. female with PMHx of lap cholecystectomy on 7/9 @ OSH who presents with a chief complaint of abdominal pain. Pt has had multiple complications related to this surgery. She had a stent placed due to a biliary leak on 7/11, an IR guided drain placed for a biloma on 7/15. She was last admitted to [facility] 8/16 for abd pain and had a repeat ERCP done showing 2 leaking cystic duct remnants and had stents replaced. She was discharged with a 10 day course of cipro. Pt states that she was feeling better at discharge but then gradually developed epigastric abd pain 2-3 days ago that has been worsening. Pt states that the pain feels like the pain she got before her cholecystectomy. She has lost her appetite and has had poor intake in the last few days. She denies any chest pain, palpitations, SOB. Her BMs have not been totally normal since this all started but there have been no changes. No urinary symptoms. Has been nauseous but has not had any vomiting.    Aside from the above, patient denies any aggravating or alleviating factors or associated symptoms.       Past Medical History:   Diagnosis Date   ??? Menarche     age 48       Past Surgical History:   Procedure Laterality Date   ??? CESAREAN SECTION      x 4   ??? CHOLECYSTECTOMY     ??? ERCP     ??? ERCP N/A 03/05/2017    Procedure: ERCP;  Surgeon: Floreen Comber, MD;  Location: [facility];  Service: Gastroenterology;  Laterality: N/A;   ??? ERCP N/A 03/23/2017    Procedure: ERCP w/ stent exhange;  Surgeon: Floreen Comber, MD;  Location: [facility];  Service: Gastroenterology;  Laterality: N/A;   ??? TUBAL LIGATION     ??? WISDOM TOOTH EXTRACTION  2010    two       Diane Stafford  reports that she has quit smoking. She has never used smokeless tobacco. She reports that she drinks alcohol. She  reports that she does not use drugs.    Previous Medications    ACETAMINOPHEN (TYLENOL) 325 MG TABLET    Take 3 tablets (975 mg total) by mouth every 8 hours as needed for Pain.    ALBUTEROL SULFATE (PROVENTIL HFA INHL)    as needed.    FAMOTIDINE (PEPCID) 20 MG TABLET    20 mg 2 times a day.    GABAPENTIN (NEURONTIN) 100 MG CAPSULE    Take 1 capsule (100 mg total) by mouth 3 times a day.    IBUPROFEN (ADVIL,MOTRIN) 600 MG TABLET    Take 1 tablet (600 mg total) by mouth every 8 hours as needed for Pain.    MELATONIN 3 MG TAB    Take 1 tablet (3 mg total) by mouth at bedtime as needed (sleep). Indications: SLEEP    PRENATAL VIT/IRON FUMARATE/FA (PRENATAL ORAL)  SENNA (SENOKOT) 8.6 MG TABLET    Take 1 tablet by mouth 2 times a day.    SERTRALINE (ZOLOFT) 50 MG TABLET    Take 50 mg by mouth daily.       Allergies:   Allergies as of 03/29/2017   ??? (No Known Allergies)         Review of Systems     ROS:  Positive for epigastric pain, nausea., loss of appetite  Negative for vomiting, CP, SOB, urinary symptoms, blood in stool or urine. All other systems reviewed are negative except as mentioned in HPI.        Physical Exam     ED Triage Vitals [03/29/17 1041]   Vital Signs Group      Temp 97.9 ??F (36.6 ??C)      Temp Source Oral      Heart Rate 75      Heart Rate Source Monitor;Automatic      Resp 20      SpO2 100 %      BP (!) 147/94      MAP (mmHg)       BP Location Right arm      BP Method Automatic      Patient Position Sitting   SpO2 100 %   O2 Device None (Room air)       General:  Pt is uncomfortable, holding her stomach in pain    HEENT:  Normocephalic, atraumatic. Mucous membranes are dry    Eyes:  Anicteric, EOMI.     Neck:  Supple, trachea midline     Pulmonary:   Lungs clear to auscultation bilaterally. No increased work of breathing.    Cardiac:  Regular rate and rhythm. Normal S1 and S2. No murmurs/rubs/gallops.    Abdomen:  Obese abd, soft, tender on palpation of epigastric region, no peritoneal signs,  IR drainage site dressing clean and dry, no drainage present in bag    Extremities:  2+ radial pulses.    Skin:  No rashes or bruising on exposed skin.    Neuro:  Alert and oriented x3. Speech normal. Moves UE and LE spontaneously.     Psych:  Mood and affect appropriate for situation.     Diagnostic Studies     Labs:    Please see electronic medical record for any tests performed in the ED     Radiology:    Please see electronic medical record for any tests performed in the ED    EKG:    No EKG done in the ED at this time.    Emergency Department Procedures     None       ED Course and MDM     Diane Stafford is a 31 y.o. female with a history and presentation as described above in HPI.  The patient was evaluated by myself and the ED Attending Physician, Dr. Estevan Ryder. All management and disposition plans were discussed and agreed upon. Appropriate labs and diagnostic studies were reviewed as they were made available. Pertinent laboratory studies in medical decision making are listed below.     Upon presentation, the patient was uncomfortable and complaining of epigastric abd pain for the past 2-3 days. She states the pain feels like it did before her GB was removed. Pt recently at [facility] for biliary stent replacement 8/16 and has multiple complications from her lap chole on 7/9 including a biloma w/ subsequent IR guided drainage. Pt now with recurrent abd pain and  elevated LFTs, AST 92, ALT 98, Alk phos 216. WBC 5.7, afebrile, VSS. Low suspicion for infection at this time, pt has been on abx for the past week. Consulted GI from ED and they recommended a CT abd to evaluate stents due to elevation in LFTs. Will be admitted to GI ward service.    At this time the patient has been admitted to GI primary service for further evaluation and management of possible biliary obstruction or leakeage. The patient will continue to be monitored here in the emergency department until which time she is moved to her new treatment  location.      Consults:  GI     Summary of Treatment in ED:    Medications   lactated Ringers 1,000 mL IV fluid (1,000 mLs Intravenous New Bag 03/29/17 1140)   ondansetron (ZOFRAN) injection 4 mg (4 mg Intravenous Given 03/29/17 1141)   OMNIPAQUE (iohexol) 350 mg iodine/mL 150 mL (150 mLs Intravenous Given 03/29/17 1258)           Impression     1. Biliary stent obstruction, subsequent encounter              Megan Salon, DO  Resident  03/29/17 [phone]

## 2017-03-29 NOTE — Unmapped (Signed)
ED Attending Attestation Note    Date of service:  03/29/2017    This patient was seen by the resident physician.  I have seen and examined the patient, agree with the workup, evaluation, management and diagnosis. The care plan has been discussed and I concur.     My assessment reveals a 31 y.o. female who presents with abdominal pain.  States she woke up with this proximal May 4 mornings ago.  Has gotten progressively worse.  Increased pain with eating.  Only had a few bites of mashed potatoes yesterday due to the pain.  Has also noticed that she has very minimal drainage out of her JP drain.  On exam, patient appears uncomfortable.  Abdomen soft, tender to palpation the right upper quadrant, no guarding.  She does have a drain in place without any surrounding erythema or drainage.

## 2017-03-29 NOTE — Unmapped (Addendum)
Patient well known to me.  Afebrile   Stable vitals  Resting comfortably in bed.  CT reviewed - no new fluid collection  LFT's stable   Slight bump in transaminase levels   Lipase normal  CBC   No leukocytosis  Perc drain output nearly decreased to zero after placement of second stent by [name]  Will ask ED to attempt a dose of toradol.  And notify GI.  Doubt there is any need for intervention based on imaging and labs.  Pain control and follow up with me.  Scheduled for follow up with me on Monday.  Have discussed with patient.  If pain persists, keep appointment.  If pain improves, cancel Monday appointment and follow up following Monday.      Mellody Drown P Conchita Truxillo

## 2017-03-29 NOTE — Unmapped (Signed)
Ready and clean bed assigned to [id number]. Pt updated on plan of care including transfer and is agreeable. Receiving RN may call [phone] to consult ED RN with questions regarding patients care. Pt is leaving the department in stable condition with all personal items in possession.   The patient does not have a patient monitor at bedside in the ED.  Most recent vitals: BP (!) 158/115 (BP Location: Left arm, Patient Position: Lying)    Pulse 68    Temp 97.9 ??F (36.6 ??C) (Oral)    Resp 20    LMP 02/15/2017 (Exact Date)    SpO2 100%     EIMLY Berea Majkowski ,RN

## 2017-03-29 NOTE — Unmapped (Addendum)
Pt c/o upper abd pain since her cholecystectomy in July. States had stent placed in bile duct 6 days ago which helped for a couple of days. No vomiting. + percutaneous drain present with bile drainage

## 2017-03-29 NOTE — Telephone Encounter (Signed)
Diane Stafford had her gall bladder removed 8/14 by you and is currently experiencing an increase of pain around rib cage that feels like it did before her surgery.  There is decreased drainage in her drains.  She has an appointment with you 8/27 at the [facility].  Advised her to go to ER if pain increases.  Patient would like to talk with you.

## 2017-03-29 NOTE — Unmapped (Signed)
Pt resting in bed quietly. Pt denies needing anything at this current time.

## 2017-03-29 NOTE — Unmapped (Signed)
Department of Internal Medicine  History & Physical    Patient: Diane Stafford  MRN: [id number]  CSN: [id number]    Chief Complaint     Increasing RUQ pain    History of Present Illness     Diane Stafford is a 31 y.o. female with a history of recent cholecystectomy (02/13/17) complicated by bile leak and underwent bile duct stent placement on 02/15/17 with percutaneous drain on 02/19/17, presenting to the hospital with worsening RUQ abdominal pain and decreased percutaneous drainage since Monday. Denies fever.    Hospital Course: A CT scan of the abdomen at admission read as: No new or recurrent fluid collections. No biliary ductal dilatation. Liver enzymes were noted to be elevated and ALP was 200s, which increased from 100s on last admission.      Review of Systems     Review of Systems   Constitutional: Positive for chills. Negative for fever.        Loss of appetite   HENT: Negative.    Eyes: Negative.    Respiratory: Negative for cough and shortness of breath.    Cardiovascular: Negative for chest pain, palpitations and leg swelling.   Gastrointestinal: Positive for abdominal pain. Negative for blood in stool, diarrhea and vomiting.   Musculoskeletal: Positive for back pain.   Skin: Negative for rash.   Neurological: Negative for dizziness and focal weakness.   Endo/Heme/Allergies: Negative.    Psychiatric/Behavioral: Negative for depression. The patient is not nervous/anxious.        Past Medical History     Past Medical History:   Diagnosis Date   ??? Menarche     age 70         Past Surgical History     Past Surgical History:   Procedure Laterality Date   ??? CESAREAN SECTION      x 4   ??? CHOLECYSTECTOMY     ??? ERCP     ??? ERCP N/A 03/05/2017    Procedure: ERCP;  Surgeon: Floreen Comber, MD;  Location: [facility];  Service: Gastroenterology;  Laterality: N/A;   ??? ERCP N/A 03/23/2017    Procedure: ERCP w/ stent exhange;  Surgeon: Floreen Comber, MD;  Location: [facility];  Service: Gastroenterology;  Laterality: N/A;   ???  TUBAL LIGATION     ??? WISDOM TOOTH EXTRACTION  2010    two         Family History     Family History   Problem Relation Age of Onset   ??? Heart disease Maternal Grandmother      aorta valve problem   ??? Hypertension Maternal Grandmother    ??? Alzheimer's disease Maternal Grandmother    ??? Aneurysm Maternal Grandfather      brain   ??? Hypertension Maternal Grandfather    ??? Mental illness Other      aunt-psych issues   ??? Other Other      2 uncles had open heart surgery   ??? Hypertension Other      two uncles         Social History     Social History     Social History   ??? Marital status: Single     Spouse name: N/A   ??? Number of children: N/A   ??? Years of education: N/A     Occupational History   ??? Not on file.     Social History Main Topics   ??? Smoking status: Former Smoker   ???  Smokeless tobacco: Never Used      Comment: 11-24-2009   ??? Alcohol use Yes      Comment: occ   ??? Drug use: No      Comment: 11-24-2009   ??? Sexual activity: Not Currently     Other Topics Concern   ??? Not on file     Social History Narrative   ??? No narrative on file         Medications     Home Meds:  Prior to Admission medications    Medication Sig Start Date End Date Taking? Authorizing Provider   acetaminophen (TYLENOL) 325 MG tablet Take 3 tablets (975 mg total) by mouth every 8 hours as needed for Pain. 03/09/17   Elias Else, CNP   ALBUTEROL SULFATE (PROVENTIL HFA INHL) as needed.    Historical Provider, MD   famotidine (PEPCID) 20 MG tablet 20 mg 2 times a day. 05/21/10   Historical Provider, MD   gabapentin (NEURONTIN) 100 MG capsule Take 1 capsule (100 mg total) by mouth 3 times a day. 03/09/17   Elias Else, CNP   ibuprofen (ADVIL,MOTRIN) 600 MG tablet Take 1 tablet (600 mg total) by mouth every 8 hours as needed for Pain. 03/09/17   Elias Else, CNP   melatonin 3 mg Tab Take 1 tablet (3 mg total) by mouth at bedtime as needed (sleep). Indications: SLEEP 03/09/17   Elias Else, CNP   PRENATAL VIT/IRON FUMARATE/FA (PRENATAL  ORAL)     Historical Provider, MD   senna (SENOKOT) 8.6 mg tablet Take 1 tablet by mouth 2 times a day. 03/09/17   Elias Else, CNP   sertraline (ZOLOFT) 50 MG tablet Take 50 mg by mouth daily. 07/23/10   Historical Provider, MD          Inpatient Meds:  Scheduled:  ??? heparin (porcine)  5,000 Units Subcutaneous 3 times per day       Continuous:      ZOX:[id number] **OR** ondansetron      Vital Signs     Temp:  [97.9 ??F (36.6 ??C)] 97.9 ??F (36.6 ??C)  Heart Rate:  [68-75] 68  Resp:  [20] 20  BP: (147-158)/(94-115) 158/115    Intake/Output Summary (Last 24 hours) at 03/29/17 1528  Last data filed at 03/29/17 1255   Gross per 24 hour   Intake             1000 ml   Output                0 ml   Net             1000 ml           Physical Exam     Physical Exam   Constitutional: She is oriented to person, place, and time. She appears well-nourished. No distress.   HENT:   Head: Normocephalic and atraumatic.   Eyes: EOM are normal. Pupils are equal, round, and reactive to light.   Neck: Normal range of motion.   Cardiovascular: Normal rate, regular rhythm and normal heart sounds.    Pulmonary/Chest: Effort normal and breath sounds normal. No respiratory distress. She has no rales.   Abdominal: Soft. Bowel sounds are normal. There is tenderness.   Musculoskeletal: Normal range of motion. She exhibits no edema.   Neurological: She is alert and oriented to person, place, and time.   Skin: Skin is warm and dry.   Psychiatric: She has  a normal mood and affect. Her behavior is normal.   Nursing note and vitals reviewed.      Laboratory Data         Lab 03/29/17  1221   WBC 5.7   HEMOGLOBIN 13.5   HEMATOCRIT 40.8   MEAN CORPUSCULAR VOLUME 84.2   PLATELETS 254           Lab 03/29/17  1116   SODIUM 138   POTASSIUM 4.8   CHLORIDE 104   CO2 25   BUN 7   CREATININE 0.70   GLUCOSE 135*           Lab 03/29/17  1116   CALCIUM 9.4                 Lab 03/29/17  1116   ALT 98*   AST 92*   ALK PHOS 216*   BILIRUBIN TOTAL 0.6   BILIRUBIN  DIRECT 0.1   ALBUMIN 3.9         Diagnostic Studies     CT Abdomen and Pelvis With IV contrast   Status: Final result   PACS Images     Show images for CT Abdomen and Pelvis With IV contrast   Study Result     EXAM: CT ABDOMEN AND PELVIS WITH IV CONTRAST  ??  CLINICAL INDICATION: Abdominal pain, unspecified; possible biliary stent obstruction or leakage;   ??  TECHNIQUE: CT of the abdomen and pelvis was performed after the uneventful administration of intravenous contrast. Axial images were obtained with coronal and sagittal reconstructions.  ??  CONTRAST: 150 mL of IOHEXOL 350 MG IODINE/ML INTRAVENOUS SOLUTION  ??  FIELD OF VIEW: 36 cm  ??  COMPARISON: 03/04/2017  ??  FINDINGS:  ??  Lower chest: Trace left pleural effusion. No focal consolidation.  ??  Liver: No focal hepatic lesions.  ??  Biliary tree: Trace pneumobilia. No intra or extrahepatic biliary ductal dilatation. 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 The CBD stent remains in place. The gallbladder surgically absent. Subhepatic drainage catheter is unchanged in position without residual or recurrent fluid collection.  ??  Spleen: Not enlarged.   ??  Pancreas: Unremarkable.  ??  Adrenal glands: Unremarkable.  ??  Kidneys/ureters: 3 mm nonobstructing calculus in the interpolar right kidney. No hydronephrosis. The urinary bladder is unremarkable.  ??  Gastrointestinal tract: Gastric, small bowel, colonic caliber and wall thickness are within normal limits. The appendix is normal.  ??  Lymphatics: No abdominal or pelvic lymph nodes are enlarged by size criteria.  ??  Vasculature: Unremarkable.  ??  Peritoneum: No free fluid, free air, or loculated fluid collections. Decreased but persistent mild subhepatic fat stranding.  ??  Abdominal wall/soft tissues: Small fat-containing periumbilical hernia.  ??  Pelvic Organs: Uterus and ovaries are normal in contour.  ??  Osseous structures: No  acute osseous abnormalities or suspicious osseous lesions.  ??  ??  ??  IMPRESSION:  ??  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 The CBD stent and a subhepatic drain are unchanged in position. No new or recurrent fluid collections. No biliary ductal dilatation.  *  Trace left pleural effusion.  ??           Assessment & Plan     Diane Stafford is a 30 y.o. female being  admitted to the hospital for abdominal pain and decreased percutaneous biliary drainage. Medical problems being addressed in this encounter include the following:    Principal Problem:    Abdominal pain    #Abdominal Pain   See HPI, the patient had cholecystectomy that was complicated by a bile leak. This was intervened with a subhepatic percutaneous drain. She decreased drainage on admission. A CT scan at admission showed no new fluid collections (see dedicated report).   -ALPs had doubled to 200s since last admission   -Pt staffed by [name] (surgeon following her in clinic) and recommended pt to see him Monday 8/28 at her regularly scheduled appointment; no acute intervention at this time    #Pain Control   -from abd pain   -Oxycodone Q4 hours 5-10mg  PRN for pain    FEN: PO liquids; K>4, Mg>2, Normal Diet    Dispo: Inpatient      Nutrition:  Diet Orders          Diet NPO effective now starting at 08/22 1323          Code Status: Full Code    Signed:  Reuel Derby, MD  03/29/2017, 3:28 PM

## 2017-03-30 DIAGNOSIS — R1011 Right upper quadrant pain: Principal | ICD-10-CM

## 2017-03-30 LAB — CBC
Hematocrit: 35.1 % (ref 35.0–45.0)
Hemoglobin: 11.7 g/dL (ref 11.7–15.5)
MCH: 27.9 pg (ref 27.0–33.0)
MCHC: 33.2 g/dL (ref 32.0–36.0)
MCV: 83.9 fL (ref 80.0–100.0)
MPV: 7.8 fL (ref 7.5–11.5)
Platelets: 172 10*3/uL (ref 140–400)
RBC: 4.18 10*6/uL (ref 3.80–5.10)
RDW: 16.7 % (ref 11.0–15.0)
WBC: 4.4 10*3/uL (ref 3.8–10.8)

## 2017-03-30 LAB — COMPREHENSIVE METABOLIC PANEL
ALT: 68 U/L (ref 7–52)
AST: 39 U/L (ref 13–39)
Albumin: 3.2 g/dL (ref 3.5–5.7)
Alkaline Phosphatase: 169 U/L (ref 36–125)
Anion Gap: 8 mmol/L (ref 3–16)
BUN: 6 mg/dL (ref 7–25)
CO2: 28 mmol/L (ref 21–33)
Calcium: 9.3 mg/dL (ref 8.6–10.3)
Chloride: 106 mmol/L (ref 98–110)
Creatinine: 0.67 mg/dL (ref 0.60–1.30)
Glucose: 111 mg/dL (ref 70–100)
Osmolality, Calculated: 292 mOsm/kg (ref 278–305)
Potassium: 3.5 mmol/L (ref 3.5–5.3)
Sodium: 142 mmol/L (ref 133–146)
Total Bilirubin: 0.4 mg/dL (ref 0.0–1.5)
Total Protein: 6.1 g/dL (ref 6.4–8.9)
eGFR AA CKD-EPI: >90 See note.
eGFR NONAA CKD-EPI: >90 See note.

## 2017-03-30 LAB — MAGNESIUM: Magnesium: 2.1 mg/dL (ref 1.5–2.5)

## 2017-03-30 MED ORDER — lidocaine (LIDODERM) 5 % 1 patch
5 | TOPICAL | Status: AC
Start: 2017-03-30 — End: 2017-03-30
  Administered 2017-03-30: 20:00:00 1 via TRANSDERMAL

## 2017-03-30 MED ORDER — lidocaine (LIDODERM) 5 %
5 | MEDICATED_PATCH | TOPICAL | 0 refills | 30.00000 days | Status: AC
Start: 2017-03-30 — End: 2017-03-31

## 2017-03-30 MED ORDER — ondansetron (ZOFRAN) 4 MG tablet
4 | ORAL_TABLET | Freq: Three times a day (TID) | ORAL | 0 refills | Status: AC | PRN
Start: 2017-03-30 — End: 2017-03-31

## 2017-03-30 MED ORDER — morphine SR (MS CONTIN, ORAMORPH SR) 12 hr tablet 15 mg
15 | Freq: Two times a day (BID) | ORAL | Status: AC
Start: 2017-03-30 — End: 2017-03-30
  Administered 2017-03-30: 14:00:00 15 mg via ORAL

## 2017-03-30 MED ORDER — morPHINE injection 5 mg
4 | INTRAVENOUS | Status: AC | PRN
Start: 2017-03-30 — End: 2017-03-30
  Administered 2017-03-30: 13:00:00 5 mg via INTRAVENOUS

## 2017-03-30 MED ORDER — morphine SR (MS CONTIN, ORAMORPH SR) 15 MG 12 hr tablet
15 | ORAL_TABLET | Freq: Two times a day (BID) | ORAL | 0 refills | Status: AC
Start: 2017-03-30 — End: 2017-03-31

## 2017-03-30 MED ORDER — proMETHazine (PHENERGAN) injection 12.5 mg
25 | Freq: Four times a day (QID) | INTRAMUSCULAR | Status: AC
Start: 2017-03-30 — End: 2017-03-30
  Administered 2017-03-30: 14:00:00 12.5 mg via INTRAVENOUS

## 2017-03-30 MED ORDER — proMETHazine (PHENERGAN) injection 12.5 mg
25 | Freq: Four times a day (QID) | INTRAMUSCULAR | Status: AC | PRN
Start: 2017-03-30 — End: 2017-03-30

## 2017-03-30 MED ORDER — acetaminophen (TYLENOL) tablet 650 mg
325 | Freq: Three times a day (TID) | ORAL | Status: AC | PRN
Start: 2017-03-30 — End: 2017-03-30
  Administered 2017-03-30: 20:00:00 650 mg via ORAL

## 2017-03-30 MED ORDER — morPHINE injection 3 mg
4 | INTRAVENOUS | Status: AC | PRN
Start: 2017-03-30 — End: 2017-03-30

## 2017-03-30 MED FILL — PROMETHAZINE 25 MG/ML INJECTION SOLUTION: 25 25 mg/mL | INTRAMUSCULAR | Qty: 1

## 2017-03-30 MED FILL — TYLENOL 325 MG TABLET: 325 325 mg | ORAL | Qty: 2

## 2017-03-30 MED FILL — MORPHINE 4 MG/ML INTRAVENOUS SOLUTION: 4 4 mg/mL | INTRAVENOUS | Qty: 1

## 2017-03-30 MED FILL — MORPHINE 4 MG/ML INTRAVENOUS SOLUTION: 4 4 mg/mL | INTRAVENOUS | Qty: 2

## 2017-03-30 MED FILL — CIPROFLOXACIN 500 MG TABLET: 500 500 MG | ORAL | Qty: 1

## 2017-03-30 MED FILL — URSODIOL 300 MG CAPSULE: 300 300 mg | ORAL | Qty: 1

## 2017-03-30 MED FILL — LIDODERM 5 % TOPICAL PATCH: 5 5 % | TOPICAL | Qty: 1

## 2017-03-30 MED FILL — HEPARIN (PORCINE) 5,000 UNIT/ML INJECTION SOLUTION: 5000 5,000 unit/mL | INTRAMUSCULAR | Qty: 1

## 2017-03-30 MED FILL — MORPHINE ER 15 MG TABLET,EXTENDED RELEASE: 15 15 MG | ORAL | Qty: 1

## 2017-03-30 NOTE — Unmapped (Signed)
Problem: Fall Prevention  Goal: Patient will remain free of falls  Assess and monitor vitals signs, neurological status including level of consciousness and orientation.  Reassess fall risk per hospital policy.    Ensure arm band on, uncluttered walking paths in room, adequate room lighting, call light and overbed table within reach, bed in low position, wheels locked, side rails up per policy (excluding SNF), and non-skid footwear provided.    Outcome: Adequate for Discharge Date Met: 03/30/17

## 2017-03-30 NOTE — Unmapped (Signed)
[facility]    Social Worker Discharge Summary     Patient name: Diane Stafford                                        Patient MRN: [id number]  DOB: 31-Oct-1985                              Age: 31 y.o.              Gender: female  Patient emergency contact: Extended Emergency Contact Information  Primary Emergency Contact: Dyson,Joshua  Address: [address]           [place], [place] [address] [place]  Home Phone: [phone]  Mobile Phone: [phone]  Relation: Significant other  Preferred language: English  Interpreter needed? No      Attending provider: No att. providers found  Primary care physician: Santiago Glad (Inactive)    The MD has indicated that the patient is ready for discharge.  ANNETT BOXWELL was referred to return home with no identified SW discharge needs.  The patient will be transported by taxi at time of discharge.    Transfer Mode/Level of Care: Taxi (funded by pt)    The plan has been reviewed:     Patient/Family Informed of Discharge Plan: Yes    Plan Reviewed With Patient, Family, or Significant Other: Yes    Patient and or family are aware and in agreement with the discharge plan: Yes             Plan reviewed with MD and other members of the health care team: Yes  Care Plan Completed: Yes    No further SW needs.    This plan has been reviewed with the multi-disciplinary team.     [name], MSW, LSW  [phone]

## 2017-03-30 NOTE — Unmapped (Signed)
Patient remains in lobby while awaiting bed placement. Reassessed by this RN. Patient is alert and oriented, respirations unlabored, and no change in patients presenting condition at this time. Triage protocol orders placed and results reviewed. VS updated. No immediate action or escalation needed at this time. Patient updated on any delay and plan. Patient verbalizes understanding and denies additional needs at this time.  Will continue to monitor while waiting in Lobby.

## 2017-03-30 NOTE — Unmapped (Addendum)
1) Continue Ciprofloxacin antibiotic for three more days including today  2) Take your pain medication as prescribed.  3) If your symptoms worsen please seek emergency medical assistance.

## 2017-03-30 NOTE — Unmapped (Signed)
Problem: Acute Pain  Patient's pain progressing toward patient's stated pain goal   Goal: Patients pain is managed to allow active participation in daily activities  Outcome: Adequate for Discharge Date Met: 03/30/17

## 2017-03-30 NOTE — Unmapped (Signed)
Nursing Overnight Progress Note    Significant Events During Shift  none    Patient/Family Concerns  Evening/Overnight Visitors: none  Concerns: pain not adequately treated    Assessment  Nursing time demands: moderate  IV access: has IV access, adequate and functioning  Sitter requirements:  no    Mental Status  No data found.      Calls to Physician Team  No data found.      Medications  [phone] - Medications Not Given  (last 12 hrs)         ** SITE UNKNOWN **       Medication Name Action Time Action Reason Comments     heparin (porcine) injection 5,000 Units 03/29/17 2025 Not Given Patient/family refused      heparin (porcine) injection 5,000 Units 03/30/17 0615 Not Given Patient/family refused                 Diet/Meals Consumed (past 24 hours)  No data found.

## 2017-03-30 NOTE — ED Triage Notes (Signed)
Patient presents to ED with complaints of post op complications since July and inability to fill her pain medications that were prescribed today upon discharge.

## 2017-03-30 NOTE — Unmapped (Signed)
Problem: Knowledge Deficit      Goal: Patient/family/caregiver demonstrates understanding of disease process, treatment plan, medications, and discharge instructions  Complete learning assessment and assess knowledge base.   Outcome: Adequate for Discharge Date Met: 03/30/17

## 2017-03-30 NOTE — Unmapped (Signed)
Lab Collection Status: Patient labs collected per protocol

## 2017-03-30 NOTE — Unmapped (Signed)
Problem: Acute Pain  Patient's pain progressing toward patient's stated pain goal   Goal: Discharge Pain Management Plan (Acute Pain)  Outcome: Adequate for Discharge Date Met: 03/30/17

## 2017-03-30 NOTE — Unmapped (Signed)
[facility]  Care Management Department    High Risk Screen    Name: Diane Stafford  MRN:  [id number]  Date:   03/30/2017     High Risk Screen  Patient admitted from nursing home, group home or rehab facility: No  Patient is over 32 years of age and lives alone: No  Patient is active with Hospice services: No  Patient is suspected victim of abuse, neglect, violence: No  Current alcohol or drug abuse: No  Patient is homeless: No  Patient is from justice center or on police hold: No  Patient has a new diagnosis of a terminal illness: No  Patient has a new diagnosis of a chronic illness: No  Patient has multiple co-morbidities and/or>5 home medications: Yes  Patient has no PCP or medical home: No  Patient has history of dementia, mental illness or DD: No  Patient has no payer source: No  Patient has previous admission within last 30 days: Yes  Anticipate specialized home health needs, i.e., wounds, trach, cellulitis, ostomy, tube-feeds, TPN, DME: Yes  Score: 7    Assignment of Risk Level  Patient has been screened by Care Management and meets High Risk Indicators, psychosocial assessment to follow. (Score of > 4)    [name], MSW, [place]  [phone]

## 2017-03-30 NOTE — Unmapped (Signed)
ED Attending Attestation Note    Date of service:  03/31/2017    This patient was seen by the resident physician.  I have seen and examined the patient, agree with the workup, evaluation, management and diagnosis. The care plan has been discussed and I concur.     My assessment reveals a 31 y.o. female who presents for abdominal pain.  The patient reports that she had gallbladder surgery in July.  She has had some couple occasions since then.  She was discharged from the hospital earlier today.  She was given prescription for pain medication but was unable to get them filled.  She started having increased pain so she was told to come back to the hospital.  On exam she is alert and oriented.  She has a drainage tube in her right upper quadrant.  There is no surrounding erythema.  There is a small bit of drainage in her collection bag.

## 2017-03-30 NOTE — Unmapped (Signed)
Problem: Acute Pain  Patient's pain progressing toward patient's stated pain goal   Goal: Patient will reduce or eliminate use of analgesics  Outcome: Adequate for Discharge Date Met: 03/30/17

## 2017-03-30 NOTE — Unmapped (Signed)
Problem: Acute Pain  Patient's pain progressing toward patient's stated pain goal   Goal: Patient will manage pain with the appropriate technique/intervention  Assess and monitor patient's pain using appropriate pain scale. Collaborate with interdisciplinary team and initiate plan and interventions as ordered.  Re-assess patient's pain level 30-60 minutes after pain management intervention.   Outcome: Adequate for Discharge Date Met: 03/30/17

## 2017-03-30 NOTE — Unmapped (Addendum)
[facility]   Social Work Psychosocial Assessment     Diane Stafford  [id number]  31 y.o.  female  White or Caucasian  Marital Status: Significant Other/Life Partner    Biliary stent obstruction, subsequent encounter [T85.590D]    Referred by: High Risk Screen  Referred Reason: Discharge Planning      History    Past Medical History:   Diagnosis Date   ??? Menarche     age 13       History   Drug Use No     Comment: 11-24-2009       History   Alcohol Use   ??? Yes     Comment: occ       Mental Health History: Denied by pt    Mental Status    Current Mental Status: Awake, Oriented to Person, Oriented to Place, Oriented to Time, Oriented to Situation  Mental Status Prior to Admission: Unable to Assess  Activities of Daily Living: Independent       Current Living Arrangements    Current Living Arrangements: Other (See Comments) (roommate: Diane Stafford)  Type of Living Arrangement: Home/Apartment    One Story or Two (check all that apply): Live on first floor  Enter the number of steps and rails to enter the residence: 2 with railing  Enter the number of steps and rails inside the residence: 0    Support Systems    Next of Kin/Contact Person: Diane Stafford  Next of Kin Relationship:  Significant Other  Next of Kin Phone Number: [phone]    [employer] Used Prior to Admission: Yes  Name of Secretary/administrator Used Prior to Admission: [employer]    Cultural/Spiritual/Language Barriers    None reported    Other Pertinent Data    Home Health Agency Name: [facility] Phone Number: [phone] (not active)    Case Manager for Mental Health IssuesPrior to Admission: No          Durable Medical Equipment Prior to Admission: No    Assessment/Plan    Per H&P documentation: Diane Stafford is a 31 y.o. female with a history of recent cholecystectomy (02/13/17) complicated by bile leak and underwent bile duct stent placement on 02/15/17 with percutaneous drain on 02/19/17,  presenting to the hospital with worsening RUQ abdominal pain and decreased percutaneous drainage since Monday. Denies fever.  ??  Care Coordinator/Social Worker Diane Stafford met with pt at bedside to complete psychosocial assessment for discharge planning as part of routine care and due to readmission to this hospital within less than one week on last discharge. SW and pt familiar with one another from pt's previous admission. SW re-introduced herself and role of SW in hospital. SW verified demographic information. Pt is a 31 year old female, engaged to Diane Stafford, and living with her roommate Diane Stafford in their [place] in [place], [place] apartment. Pt denies any concerns for her safety or for abuse. Pt states that she works but is experiencing some financial difficulties. Pt reports that since her last admission she has become involved with a community resource called [address] that operates out of [place], [place]. Per pt, she is receiving assistance with finding a new apartment, furnishing the apartment, and case management services. Pt reports that [employer] provided her with a cab voucher to return home from this hospitalization. Pt reports that her four children Clinical cytogeneticist, Colton/son, McKynna/daughter, and Sofyia/daughter) are all still living  with pt's mother at this time. Pt reports that her fiance is still incarcerate. SW addressed with pt that her sole emergency contact is her fiance who will not be able to come to hospital to be with pt should be in an emergent situation. SW encouraged pt to consider additional emergency contact persons who could be added to her demographics, however pt stated that she has no other support persons who she is able to name at this time. SW and pt discussed HCPOA and living will documentation and process for determining legal NOK should no Advanced Directives be in place. Pt declined to complete Advanced Directives paperwork or to receive further information  regarding Advanced Directives at this time. Pt continues to deny any history of or current mental health diagnoses or concerns. Pt continues to deny any history of or current suicidal ideation, self-injurious urges or behaviors, homicidal ideation, and/or audiovisual hallucinations. Pt continues to deny any tobacco or drug/illicit substance use. Pt continues to report that she will occasionally have around 1 alcoholic beverage per month. Pt continues to report no DME use, home oxygen, or dialysis at this time. Pt continues to deny previous skilled nursing facility or inpatient rehabilitation facility admissions. Pt reports that she was visited by a skilled RN per home health order from previous discharge from [facility] however pt inquired if continued [facility] services are necessary at this time. SW confirmed with medical team that if pt is comfortable doing wound and drain care independently there is no necessity of continued HHC at this time.    Patient/Family aware and taking part in the discharge plan.  Patient and family were offered a post-acute provider list as applicable to the discharge plan and insurance provider.  Patient and family were given the freedom to choose providers and financial interest(s) were disclosed as appropriate.    This assessment has been reviewed with the multi-disciplinary team.    SW will continue to follow this pt and to communicate with the medical team to assist with discharge planning and to address psychosocial needs as they arise.    Diane Stafford, MSW, LSW  [phone]

## 2017-03-30 NOTE — Unmapped (Signed)
Problem: Acute Pain  Patient's pain progressing toward patient's stated pain goal   Goal: Patient displays improved well-being such as baseline levels for pulse, BP, respirations and relaxed muscle tone or body posture  Outcome: Adequate for Discharge Date Met: 03/30/17

## 2017-03-30 NOTE — Unmapped (Signed)
[facility]  Department of Internal Medicine  Inpatient Discharge Summary    Patient: Diane Stafford   MRN: [id number]   CSN: [id number]    Date of Admission: 03/29/2017  Date of Discharge: 03/30/2017  Attending Physician: No att. providers found       Diagnoses Present on Admission     Past Medical History:   Diagnosis Date   ??? Menarche     age 31          Discharge Diagnoses     Active Hospital Problems    Diagnosis Date Noted   ??? Abdominal pain [R10.9] 03/29/2017      Resolved Hospital Problems    Diagnosis Date Noted Date Resolved   No resolved problems to display.         Operations/Procedures Performed (include dates)     Surgeries:  n/a      Lines/Drains/Airways:  Patient Lines/Drains/Airways Status    Active Line / PIV Line     Name:   Placement date:   Placement time:   Site:   Days:    Peripheral IV 03/23/17 Left Antecubital  03/23/17    2225    Antecubital    6    Peripheral IV 03/29/17 Left Antecubital  03/29/17    1124    Antecubital    1                  Notable Imaging Studies:  CT Abdomen and Pelvis With IV contrast   Status: Final result   PACS Images     Show images for CT Abdomen and Pelvis With IV contrast   Study Result     EXAM: CT ABDOMEN AND PELVIS WITH IV CONTRAST  ??  CLINICAL INDICATION: Abdominal pain, unspecified; possible biliary stent obstruction or leakage;   ??  TECHNIQUE: CT of the abdomen and pelvis was performed after the uneventful administration of intravenous contrast. Axial images were obtained with coronal and sagittal reconstructions.  ??  CONTRAST: 150 mL of IOHEXOL 350 MG IODINE/ML INTRAVENOUS SOLUTION  ??  FIELD OF VIEW: 36 cm  ??  COMPARISON: 03/04/2017  ??  FINDINGS:  ??  Lower chest: Trace left pleural effusion. No focal consolidation.  ??  Liver: No focal hepatic lesions.  ??  Biliary tree: Trace pneumobilia. No intra or extrahepatic biliary ductal dilatation. 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 The CBD stent remains in place. The gallbladder surgically absent. Subhepatic drainage catheter is unchanged in position without residual or recurrent fluid collection.  ??  Spleen: Not enlarged.   ??  Pancreas: Unremarkable.  ??  Adrenal glands: Unremarkable.  ??  Kidneys/ureters: 3 mm nonobstructing calculus in the interpolar right kidney. No hydronephrosis. The urinary bladder is unremarkable.  ??  Gastrointestinal tract: Gastric, small bowel, colonic caliber and wall thickness are within normal limits. The appendix is normal.  ??  Lymphatics: No abdominal or pelvic lymph nodes are enlarged by size criteria.  ??  Vasculature: Unremarkable.  ??  Peritoneum: No free fluid, free air, or loculated fluid collections. Decreased but persistent mild subhepatic fat stranding.  ??  Abdominal wall/soft tissues: Small fat-containing periumbilical hernia.  ??  Pelvic Organs: Uterus and ovaries are normal in contour.  ??  Osseous structures: No acute osseous abnormalities or suspicious osseous lesions.  ??  ??  ??  IMPRESSION:  ??  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 The CBD stent and a subhepatic drain are unchanged in position. No new or recurrent fluid collections. No biliary ductal dilatation.  *  Trace left pleural effusion.  ??  Report Verified by: Annett Fabian at 03/29/2017 1:38 PM EDT           Other Procedures:  1. n/a      Consulting Services (include reason)     1. [name] - surgeon: saw the patient at admission in the ER for follow up      Allergies            Discharge Medications        Medication List      ASK your doctor about these medications      Quantity/Refills   acetaminophen 325 MG tablet  Commonly known as:  TYLENOL  Take 3 tablets (975 mg total) by mouth every 8 hours as needed for Pain.   Quantity:  100 tablet  Refills:  0     ciprofloxacin HCl 500 MG tablet  Commonly known as:  CIPRO  Take  500 mg by mouth 2 times a day.   Refills:  0     melatonin 3 mg Tab  Take 1 tablet (3 mg total) by mouth at bedtime as needed (sleep). Indications: SLEEP   Quantity:  30 each  For:  SLEEP  Refills:  0            Reason for Admission     Diane Stafford is a 31 y.o. female with a history of recent cholecystectomy (02/13/17) complicated by bile leak and underwent bile duct stent placement on 02/15/17 with percutaneous drain on 02/19/17, presenting to the hospital with worsening RUQ abdominal pain and decreased percutaneous drainage .    Hospital Course By Problem     Abdominal Pain    The patient had cholecystectomy that was complicated by a bile leak an an OSH. This was intervened with a subhepatic percutaneous drain on a previous admission. She had decreased drainage at admission and persistent abdominal pain. A CT scan at admission did not reveal and new intrahepatic dilation or fluid collection. (see dedicated report). Pt staffed by [name] (surgeon following her in clinic) and recommended pt to see him Monday 8/27 at her regularly scheduled appointment with no acute intervention at this time    Pain Control    Pt reports persistent chronic abdominal pain from her cholecystectomy complications. During this hospital stay we had her on IV morphine and switched to MS-Contin twice daily for more stable control of her abdominal pain until she can be seen on 8/27 in her surgeon's regularly scheduled clinic.       Condition on Discharge     1. Functional Status: Normal    2. Mental Status: Normal    3. Diet / Tube Feeding / TPN:  Diet Orders          Diet regular starting at 08/22 1605        As listed above    4. Respiratory / Lines & Tubes / Wounds:  Pt has a prior to admission percutaneous subhepatic drain.    5. Discharge Physical Exam:  BP 99/52 (BP Location: Right arm, Patient Position: Lying)    Pulse 77    Temp 97.8 ??F (36.6 ??C) (Oral)    Resp 16    Ht 5' 1.75 (  1.568 m)    Wt 194 lb 6 oz (88.2 kg)    LMP  02/15/2017 (Exact Date)    SpO2 100%    BMI 35.84 kg/m??      Physical Exam   Constitutional: She is oriented to person, place, and time. She appears well-nourished. No distress.   HENT:   Head: Atraumatic.   Eyes: EOM are normal. Pupils are equal, round, and reactive to light.   Neck: Normal range of motion.   Cardiovascular: Normal rate, regular rhythm, normal heart sounds and intact distal pulses.    Pulmonary/Chest: Effort normal and breath sounds normal.   Abdominal: Soft. Bowel sounds are normal.   Musculoskeletal: She exhibits no edema.   Neurological: She is alert and oriented to person, place, and time. No cranial nerve deficit.   Skin: Skin is warm and dry. No rash noted.   Psychiatric: She has a normal mood and affect. Her behavior is normal.   Nursing note and vitals reviewed.      Core Measure Documentation (As Applicable)     Most Recent Wt: Weight: 194 lb 6 oz (88.2 kg)      Disposition     Home independent      Follow-Up Appointments     Future Appointments  Date Time Provider Department Center   04/03/2017 2:00 PM Bertram Savin, MD [facility]   04/05/2017 11:00 AM Kathryne Gin, MD [facility]       No follow-up provider specified.       Patient Instructions / Follow-Up Items for Receiving Physician     You were here for abdominal pain due to complications of a recent surgery you had. We can give you a short supply of pain medicine to go home on, but you must see your surgeon at your scheduled appointment on Monday 8/27. You will need to follow up with your surgeon on best management options for this condition. Please also continue to see your primary care provider regularly.    1) Continue Ciprofloxacin antibiotic for three more days including today  2) Take your pain medication as prescribed.  3) If your symptoms worsen please seek emergency medical assistance.      Signed:    Reuel Derby, MD   03/30/2017, 1:43 PM

## 2017-03-30 NOTE — Unmapped (Signed)
[place] ED Note    Date of service:  03/31/2017    Reason for Visit: Pain and Post-op Problem    Patient History     HPI:  This is a 31 y.o. female with a history of  Biloma due to high biliary leak status post laparoscopic cholecystectomy was recently discharged from the hospital one day ago presenting with increased abdominal pain.      The patient complains of abdominal pain which began 2 days ago. The pain is sharp in character. It is localized to right upper quadrant and radiates to the rest of the abdomen. The pain is constant and worsening. The patient currently rates it 10 out of 10 in severity. It is aggravated by any movement or palpation of the abdomen.  No relieving factors identified.  Associated symptoms include nausea. The patient specifically denies hematemesis.    Patient spoke with her gastroenterologist, Dr. Floreen Comber, who recommended she return to the emergency department for evaluation and possible admission for pain control.  Patient was prescribed MS Contin and Lidoderm patches on discharge, but was unable to fill them due to lack of insurance coverage for these medications.    Unless specified, patient denies any other symptoms or reason for today's visit to the ED.       Past Medical History:   Diagnosis Date   ??? Menarche     age 1       Past Surgical History:   Procedure Laterality Date   ??? CESAREAN SECTION      x 4   ??? CHOLECYSTECTOMY     ??? ERCP     ??? ERCP N/A 03/05/2017    Procedure: ERCP;  Surgeon: Floreen Comber, MD;  Location: [facility];  Service: Gastroenterology;  Laterality: N/A;   ??? ERCP N/A 03/23/2017    Procedure: ERCP w/ stent exhange;  Surgeon: Floreen Comber, MD;  Location: [facility];  Service: Gastroenterology;  Laterality: N/A;   ??? TUBAL LIGATION     ??? WISDOM TOOTH EXTRACTION  2010    two       Diane Stafford  reports that she has quit smoking. She has never used smokeless tobacco. She reports that she drinks alcohol.  She reports that she does not use drugs.    Previous Medications    ACETAMINOPHEN (TYLENOL) 325 MG TABLET    Take 3 tablets (975 mg total) by mouth every 8 hours as needed for Pain.    CIPROFLOXACIN HCL (CIPRO) 500 MG TABLET    Take 500 mg by mouth 2 times a day.       Allergies:   Allergies as of 03/30/2017   ??? (No Known Allergies)       PMH: Nursing notes and medical chart reviewed, deviations from documentation as noted in HPI  PSH: Nursing notes and medical chart reviewed, deviations from documentation as noted in HPI   FH: Nursing notes and medical chart reviewed, deviations from documentation as noted in HPI   MEDS: Nursing notes and medical chart reviewed, deviations from documentation as noted in HPI    Review of Systems      Review of Systems   Constitutional: Negative for chills, diaphoresis, fever, malaise/fatigue and weight loss.   HENT: Negative for sore throat.    Eyes: Negative for blurred vision and double vision.   Respiratory: Negative for cough and shortness of breath.    Cardiovascular: Negative for chest pain and palpitations.   Gastrointestinal: Positive for abdominal pain  and nausea. Negative for diarrhea and vomiting.   Genitourinary: Negative for dysuria, flank pain and hematuria.   Musculoskeletal: Negative for back pain, myalgias and neck pain.   Skin: Negative for rash.   Neurological: Negative for dizziness, focal weakness, weakness and headaches.   Psychiatric/Behavioral: Negative for substance abuse and suicidal ideas.   All other systems reviewed and are negative.      A full, ten-point review of systems was performed. Notable findings per HPI. All other pertinent systems reviewed and negative.    Physical Exam     Vitals:    03/31/17 0104   BP: 134/89   Pulse: 91   Resp: 16   Temp:    SpO2: 100%       General: Nontoxic, well nourished; well developed;Uncomfortable appearing but in no acute distress.  Eyes:  Pupils equal, round and reactive. Extra ocular movements intact.  HENT:  Normocephalic, atraumatic, no rhinorrhea, no oral or lingular edema, uvula midline, no tonsillar exudates or edema.   Neck: Neck supple, trachea midline, no meningismus, no lymphadenopathy.   Pulmonary: Lung sounds clear to auscultation bilaterally with good air entry; no respiratory distress; no wheezes or rales.   Cardiovascular: Regular rate and rhythm with no murmurs, rubs, or gallops; 2+ bilateral upper extremity peripheral pulses.   Chest:  No chest wall trauma. No tenderness to palpation or percussion.   Abdomen: Soft, non-distended, diffusely tender to palpation, bile drain present in the right upper quadrant without surrounding erythema or purulent.  Musculoskeletal: Atraumatic exam with no focal swelling or tenderness, no peripheral edema. ROM intact in all 4 extremities.   Skin: Warm and well perfused without rashes or lesions.  Neuro: Alert and oriented, CN II-XII grossly intact bilaterally. Normal speech without dysarthria or aphasia. Normal gait.   Psych: Appropriate mood and affect to the clinical situation.     Diagnostic Studies   Labs:    Labs Reviewed   CBC - Abnormal; Notable for the following:        Result Value    RDW 17.0 (*)     All other components within normal limits   DIFFERENTIAL - Abnormal; Notable for the following:     nRBC 1 (*)     All other components within normal limits   HEPATIC FUNCTION PANEL - Abnormal; Notable for the following:     AST 55 (*)     ALT 71 (*)     Alkaline Phosphatase 198 (*)     All other components within normal limits   URINALYSIS-MACROSCOPIC W/REFLEX TO MICROSCOPIC - Abnormal; Notable for the following:     Blood, UA Trace-intact (*)     All other components within normal limits   VENOUS BLOOD GAS, LINE/SYRINGE - Abnormal; Notable for the following:     PCO2-Line Draw 52 (*)     PO2-Line Draw 19 (*)     HCO3-Line Draw 31 (*)     CO2 Content-Line Draw 33 (*)     Base Excess-Line Draw 5.1 (*)     %HBO2-Line Draw 25.6 (*)     Reduced Hemoglobin-Line Draw 72.5  (*)     All other components within normal limits   BASIC METABOLIC PANEL   MAGNESIUM   LIPASE   LACTIC ACID, VENOUS, WHOLE BLOOD, [facility]   PHOSPHORUS   HCG URINE, QUALITATIVE   CBC   RENAL FUNCTION PANEL W/EGFR   HEPATIC FUNCTION PANEL   PROTIME-INR   URINALYSIS, MICROSCOPIC  All other labs reviewed and unremarkable or within normal limits.    Radiology:    No orders to display       Images reviewed and I agree with the above findings.    Emergency Department Procedures     Procedures    ED Course and MDM     RAIGAN BARIA is a 31 y.o. female with a history and presentation as described above in HPI.  The patient was evaluated by myself and the ED Attending Physician, Dr.Baxter. All management and disposition plans were discussed and agreed upon. Nursing documentation, labs, and imaging reviewed. Significant physical exam findings noted in physical exam.    On initial presentation, the patient was hemodynamically stable and afebrile.      Patient with a biloma and percutaneous bile drainage tube presenting with abdominal pain.  She was just discharged from the hospital approximately 12 hours ago.  Patient was unable to fill her MS Contin or lidocaine patch prescriptions.  Patient spoke with her gastroenterologist who recommended presentation to the emergency department for admission.  GI fellow was aware of patient.  Patient will be admitted for pain control.  No signs of acute sepsis or other intra-abdominal infection at this time.    Medications received during this ED visit:  Medications   heparin (porcine) injection 5,000 Units (not administered)   oxyCODONE-acetaminophen (PERCOCET) 5-325 mg per tablet 1 tablet (not administered)   ciprofloxacin HCl (CIPRO) tablet 500 mg (not administered)   proMETHazine (PHENERGAN) tablet 12.5 mg (not administered)   HYDROmorphone (DILAUDID) injection Syrg 2 mg (2 mg Intravenous Given 03/31/17 0145)   ondansetron (ZOFRAN) injection 8 mg (8 mg Intravenous Given 03/31/17 0208)        Impression     1. Right upper quadrant abdominal pain         Disposition and Plan     At this time the patient has been admitted to Gastroenterology.  I discussed the history, exam findings, and test findings with the admitting service.  All questions were answered.  At the time of admission, the patient was stable.    Future Appointments  Date Time Provider Department Center   04/03/2017 2:00 PM Bertram Savin, MD [facility]   04/05/2017 11:00 AM Kathryne Gin, MD [facility]       Miquel Dunn, MD MBA, PGY-3  [facility]     Caren Hazy Billey Chang, MD  Resident  03/31/17 [phone]

## 2017-03-30 NOTE — Telephone Encounter (Signed)
The patient call me while on-call for GI this evening.  She stated that was discharged from the GI inpatient service earlier today with prescriptions for MS-Contin and Lidoderm patches, neither of which are covered by her insurance.  I instructed her to return to the ER as I cannot address this issue after hours.  The issue was discussed with the GI ward fellow, Dr. Pennie Rushing, who is also on call with me today.

## 2017-03-30 NOTE — Unmapped (Signed)
Problem: Acute Pain  Patient's pain progressing toward patient's stated pain goal   Goal: Patient verbalizes a reduction in pain level  Outcome: Adequate for Discharge Date Met: 03/30/17

## 2017-03-31 ENCOUNTER — Observation Stay: Admit: 2017-03-31 | Payer: Medicaid (Managed Care)

## 2017-03-31 ENCOUNTER — Observation Stay
Admission: EM | Admit: 2017-03-31 | Discharge: 2017-04-04 | Disposition: A | Discharge status: Home / Self Care | Payer: Medicaid (Managed Care) | Admitting: Gastroenterology

## 2017-03-31 DIAGNOSIS — K9189 Other postprocedural complications and disorders of digestive system: Secondary | ICD-10-CM

## 2017-03-31 LAB — RENAL FUNCTION PANEL W/EGFR
Albumin: 3.7 g/dL (ref 3.5–5.7)
Anion Gap: 10 mmol/L (ref 3–16)
BUN: 11 mg/dL (ref 7–25)
CO2: 29 mmol/L (ref 21–33)
Calcium: 9.4 mg/dL (ref 8.6–10.3)
Chloride: 101 mmol/L (ref 98–110)
Creatinine: 0.7 mg/dL (ref 0.60–1.30)
Glucose: 97 mg/dL (ref 70–100)
Osmolality, Calculated: 289 mOsm/kg (ref 278–305)
Phosphorus: 4.9 mg/dL (ref 2.1–4.7)
Potassium: 4.2 mmol/L (ref 3.5–5.3)
Sodium: 140 mmol/L (ref 133–146)
eGFR AA CKD-EPI: >90 See note.
eGFR NONAA CKD-EPI: >90 See note.

## 2017-03-31 LAB — CBC
Hematocrit: 39.2 % (ref 35.0–45.0)
Hematocrit: 37.1 % (ref 35.0–45.0)
Hemoglobin: 13 g/dL (ref 11.7–15.5)
Hemoglobin: 12.3 g/dL (ref 11.7–15.5)
MCH: 28.1 pg (ref 27.0–33.0)
MCH: 28.1 pg (ref 27.0–33.0)
MCHC: 33.2 g/dL (ref 32.0–36.0)
MCHC: 33.2 g/dL (ref 32.0–36.0)
MCV: 84.6 fL (ref 80.0–100.0)
MCV: 84.6 fL (ref 80.0–100.0)
MPV: 8.2 fL (ref 7.5–11.5)
MPV: 7.9 fL (ref 7.5–11.5)
Platelets: 225 10E3/uL (ref 140–400)
Platelets: 203 10*3/uL (ref 140–400)
RBC: 4.63 10E6/uL (ref 3.80–5.10)
RBC: 4.38 10*6/uL (ref 3.80–5.10)
RDW: 17 % — ABNORMAL HIGH (ref 11.0–15.0)
RDW: 16.8 % (ref 11.0–15.0)
WBC: 5.3 10E3/uL (ref 3.8–10.8)
WBC: 5.4 10*3/uL (ref 3.8–10.8)

## 2017-03-31 LAB — BASIC METABOLIC PANEL
Anion Gap: 9 mmol/L (ref 3–16)
BUN: 11 mg/dL (ref 7–25)
CO2: 26 mmol/L (ref 21–33)
Calcium: 9.2 mg/dL (ref 8.6–10.3)
Chloride: 102 mmol/L (ref 98–110)
Creatinine: 0.71 mg/dL (ref 0.60–1.30)
Glucose: 99 mg/dL (ref 70–100)
Osmolality, Calculated: 283 mosm/kg (ref 278–305)
Potassium: 4.7 mmol/L (ref 3.5–5.3)
Sodium: 137 mmol/L (ref 133–146)
eGFR AA CKD-EPI: >90 See note.
eGFR NONAA CKD-EPI: >90 See note.

## 2017-03-31 LAB — VENOUS BLOOD GAS, LINE/SYRINGE
%HBO2-Line Draw: 25.6 % (ref 40.0–70.0)
Base Excess-Line Draw: 5.1 mmol/L (ref <=3.0)
CO2 Content-Line Draw: 33 mmol/L (ref 25–29)
Carboxyhgb-Line Draw: 1.4 % (ref 0.0–2.0)
HCO3-Line Draw: 31 mmol/L (ref 24–28)
Methemoglobin-Line Draw: 0.5 % (ref 0.0–1.5)
PCO2-Line Draw: 52 mm Hg (ref 41–51)
PH-Line Draw: 7.39 (ref 7.32–7.42)
PO2-Line Draw: 19 mm Hg (ref 25–40)
Reduced Hemoglobin-Line Draw: 72.5 % (ref 0.0–5.0)

## 2017-03-31 LAB — URINALYSIS, MICROSCOPIC
RBC, UA: 1 /HPF (ref 0–3)
Squam Epithel, UA: 1 /HPF (ref 0–5)
WBC, UA: 2 /HPF (ref 0–5)

## 2017-03-31 LAB — HEPATIC FUNCTION PANEL
ALT: 71 U/L — ABNORMAL HIGH (ref 7–52)
ALT: 65 U/L — ABNORMAL HIGH (ref 7–52)
AST: 55 U/L — ABNORMAL HIGH (ref 13–39)
AST: 40 U/L — ABNORMAL HIGH (ref 13–39)
Albumin: 3.7 g/dL (ref 3.5–5.7)
Albumin: 3.7 g/dL (ref 3.5–5.7)
Alkaline Phosphatase: 198 U/L — ABNORMAL HIGH (ref 36–125)
Alkaline Phosphatase: 198 U/L — ABNORMAL HIGH (ref 36–125)
Bilirubin, Direct: 0.1 mg/dL (ref 0.0–0.4)
Bilirubin, Direct: 0.1 mg/dL (ref 0.0–0.4)
Bilirubin, Indirect: 0.5 mg/dL (ref 0.0–1.1)
Bilirubin, Indirect: 0.3 mg/dL (ref 0.0–1.1)
Total Bilirubin: 0.5 mg/dL (ref 0.0–1.5)
Total Bilirubin: 0.4 mg/dL (ref 0.0–1.5)
Total Protein: 7.2 g/dL (ref 6.4–8.9)
Total Protein: 7 g/dL (ref 6.4–8.9)

## 2017-03-31 LAB — DIFFERENTIAL
Basophils Absolute: 21 /uL (ref 0–200)
Basophils Relative: 0.4 % (ref 0.0–1.0)
Eosinophils Absolute: 281 /uL (ref 15–500)
Eosinophils Relative: 5.3 % (ref 0.0–8.0)
Lymphocytes Absolute: 2284 /uL (ref 850–3900)
Lymphocytes Relative: 43.1 % (ref 15.0–45.0)
Monocytes Absolute: 498 /uL (ref 200–950)
Monocytes Relative: 9.4 % (ref 0.0–12.0)
Neutrophils Absolute: 2215 /uL (ref 1500–7800)
Neutrophils Relative: 41.8 % (ref 40.0–80.0)
nRBC: 1 /100{WBCs} — ABNORMAL HIGH (ref 0–0)

## 2017-03-31 LAB — PHOSPHORUS: Phosphorus: 4.7 mg/dL (ref 2.1–4.7)

## 2017-03-31 LAB — LACTIC ACID, VENOUS, WHOLE BLOOD, UCMC: Lactate, Ven: 1.4 mmol/L (ref 0.5–2.2)

## 2017-03-31 LAB — URINALYSIS-MACROSCOPIC W/REFLEX TO MICROSCOPIC
Bilirubin, UA: NEGATIVE
Glucose, UA: NEGATIVE mg/dL
Ketones, UA: NEGATIVE mg/dL
Leukocytes, UA: NEGATIVE
Nitrite, UA: NEGATIVE
Protein, UA: NEGATIVE mg/dL
Specific Gravity, UA: 1.02 (ref 1.005–1.035)
Urobilinogen, UA: 0.2 EU/dL (ref 0.2–1.0)
pH, UA: 7.5 (ref 5.0–8.0)

## 2017-03-31 LAB — PROTIME-INR
INR: 1 (ref 0.9–1.1)
Protime: 13.2 seconds (ref 11.8–14.8)

## 2017-03-31 LAB — LIPASE: Lipase: 7 U/L (ref 4–82)

## 2017-03-31 LAB — HCG URINE, QUALITATIVE: Preg Test, Ur: NEGATIVE

## 2017-03-31 LAB — MAGNESIUM: Magnesium: 2.1 mg/dL (ref 1.5–2.5)

## 2017-03-31 MED ORDER — proMETHazine (PHENERGAN) injection 12.5 mg
25 | INTRAMUSCULAR | Status: AC | PRN
Start: 2017-03-31 — End: 2017-04-02
  Administered 2017-03-31 – 2017-04-01 (×2): 12.5 mg via INTRAVENOUS
  Administered 2017-04-01: 15:00:00 via INTRAVENOUS
  Administered 2017-04-01 (×2): 12.5 mg via INTRAVENOUS

## 2017-03-31 MED ORDER — ciprofloxacin HCl (CIPRO) tablet 500 mg
500 | Freq: Two times a day (BID) | ORAL | Status: AC
Start: 2017-03-31 — End: 2017-04-04
  Administered 2017-03-31 – 2017-04-04 (×9): 500 mg via ORAL

## 2017-03-31 MED ORDER — oxyCODONE-acetaminophen (PERCOCET) 5-325 mg per tablet 1 tablet
5-325 | ORAL | Status: AC | PRN
Start: 2017-03-31 — End: 2017-04-02
  Administered 2017-03-31: 09:00:00 1 via ORAL

## 2017-03-31 MED ORDER — HYDROmorphone (DILAUDID) injection Syrg 1 mg
1 | INTRAMUSCULAR | Status: AC | PRN
Start: 2017-03-31 — End: 2017-04-02
  Administered 2017-03-31 – 2017-04-01 (×8): 1 mg via INTRAVENOUS

## 2017-03-31 MED ORDER — oxyCODONE (ROXICODONE) immediate release tablet 10 mg
5 | Freq: Once | ORAL | Status: AC
Start: 2017-03-31 — End: 2017-03-31
  Administered 2017-03-31: 12:00:00 10 mg via ORAL

## 2017-03-31 MED ORDER — proMETHazine (PHENERGAN) tablet 12.5 mg
25 | Freq: Four times a day (QID) | ORAL | Status: AC | PRN
Start: 2017-03-31 — End: 2017-03-31

## 2017-03-31 MED ORDER — HYDROmorphone (DILAUDID) injection Syrg 2 mg
1 | INTRAMUSCULAR | Status: AC | PRN
Start: 2017-03-31 — End: 2017-04-02

## 2017-03-31 MED ORDER — proMETHazine (PHENERGAN) injection 12.5 mg
25 | Freq: Every day | INTRAMUSCULAR | Status: AC
Start: 2017-03-31 — End: 2017-03-31
  Administered 2017-03-31: 13:00:00 12.5 mg via INTRAVENOUS

## 2017-03-31 MED ORDER — HYDROmorphone (DILAUDID) injection Syrg 2 mg
1 | Freq: Once | INTRAMUSCULAR | Status: AC
Start: 2017-03-31 — End: 2017-03-31
  Administered 2017-03-31: 06:00:00 2 mg via INTRAVENOUS

## 2017-03-31 MED ORDER — ondansetron (ZOFRAN) injection 8 mg
4 | Freq: Once | INTRAMUSCULAR | Status: AC
Start: 2017-03-31 — End: 2017-03-31
  Administered 2017-03-31: 06:00:00 8 mg via INTRAVENOUS

## 2017-03-31 MED ORDER — OMNIPAQUE (iohexol) 350 mg iodine/mL 150 mL
350 | Freq: Once | INTRAVENOUS | Status: AC | PRN
Start: 2017-03-31 — End: 2017-03-31
  Administered 2017-03-31: 16:00:00 150 mL via INTRAVENOUS

## 2017-03-31 MED ORDER — heparin (porcine) injection 5,000 Units
5000 | Freq: Three times a day (TID) | INTRAMUSCULAR | Status: AC
Start: 2017-03-31 — End: 2017-04-04

## 2017-03-31 MED FILL — OXYCODONE 5 MG TABLET: 5 5 MG | ORAL | Qty: 2

## 2017-03-31 MED FILL — OMNIPAQUE 350 MG IODINE/ML INTRAVENOUS SOLUTION: 350 350 mg iodine/mL | INTRAVENOUS | Qty: 150

## 2017-03-31 MED FILL — PROMETHAZINE 25 MG/ML INJECTION SOLUTION: 25 25 mg/mL | INTRAMUSCULAR | Qty: 1

## 2017-03-31 MED FILL — HYDROMORPHONE 1 MG/ML INJECTION SYRINGE: 1 1 mg/mL | INTRAMUSCULAR | Qty: 1

## 2017-03-31 MED FILL — ONDANSETRON HCL (PF) 4 MG/2 ML INJECTION SOLUTION: 4 4 mg/2 mL | INTRAMUSCULAR | Qty: 4

## 2017-03-31 MED FILL — HEPARIN (PORCINE) 5,000 UNIT/ML INJECTION SOLUTION: 5000 5,000 unit/mL | INTRAMUSCULAR | Qty: 1

## 2017-03-31 MED FILL — OXYCODONE-ACETAMINOPHEN 5 MG-325 MG TABLET: 5-325 5-325 mg | ORAL | Qty: 1

## 2017-03-31 MED FILL — HYDROMORPHONE 1 MG/ML INJECTION SYRINGE: 1 1 mg/mL | INTRAMUSCULAR | Qty: 2

## 2017-03-31 MED FILL — CIPROFLOXACIN 500 MG TABLET: 500 500 MG | ORAL | Qty: 1

## 2017-03-31 NOTE — Consults (Signed)
General Surgery History and Physical/Consultation Note    Patient: Diane Stafford  MRN: [id number]  CSN: [id number]    Requesting Physician: Langley Adie, M.D.    History     CC: RUQ pain    HPI: Diane Stafford a 31 y.o.femalewith PMH of laparoscopic cholecystectomy 7/9 at [facility] c/b bile leak s/p ERCP with biliary stent placement 7/11 with biloma recurrence s/p placement of IR drain on 7/15 all at OSH. She presented to the ED at [facility] for RUQ pain, bilious emesis and bilious drainage from her drain. After admission to [facility] for continued bilious emesis/drainage from tube, she had a repeat ERCP showing two cystic duct remnants with leakage with removal of existing biliary stent and replacement with new stent. She was discharged on 8/2 with about 400-500 cc of bilious drainage/day from perc drain tolerating diet with decreased pain.     On presentation to [facility] clinic on 8/2, she continued to have about 700 cc bilious, light green drainage per day from her percutaneous drain. She states this is increased. She also had new RUQ pain as well as continued pain around her percutaneous drainage site. She deniedN/V and waseating and drinking well. No fever. Increased number of bowel movements, normal consistency. No diarrhea. She did report some decreased urine output but has been trying to increase her PO fluid intake to compensate. She reports she feels fatigued.     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 There was no leak from adjacent cystic duct remnant. Right anterior, left hepatic and left intrahepatic ducts appeared normal. After this ERCP, Diane Stafford states that she initially felt better with mildly decreased drainage and RUQ pain. However, a few days after the ERCP she began to feel RUQ pain again with persistent bilious drainage. On 8/23, Diane Stafford was  readmitted to the GI service with continued and worsening abdominal pain. A CT scan showed no new or recurrent fluid collections. She was discharged with a prescription for pain medicine and plans to follow up with [name] on Monday, August 27 but was unable to fill the prescription due to insurance issues and subsequently represented to the ED with uncontrolled, severe RUQ pain. She was admitted to the GI service with plans for a repeat CT abd/pelvis with IV contrast. Upon examination today, she had scant bilious drainage mixed with new serosangiunous fluid. She endorses nausea, no vomiting. No diarrhea/constipation. No fever/chills. Continued RUQ pain with pain surrounding drain insertion site.         PMH:  Past Medical History:   Diagnosis Date    Menarche     age 89       PSH:  Past Surgical History:   Procedure Laterality Date    CESAREAN SECTION      x 4    CHOLECYSTECTOMY      ERCP      ERCP N/A 03/05/2017    Procedure: ERCP;  Surgeon: Floreen Comber, MD;  Location: [facility];  Service: Gastroenterology;  Laterality: N/A;    ERCP N/A 03/23/2017    Procedure: ERCP w/ stent exhange;  Surgeon: Floreen Comber, MD;  Location: [facility];  Service: Gastroenterology;  Laterality: N/A;    TUBAL LIGATION      WISDOM TOOTH EXTRACTION  2010    two       Medications:  Current Facility-Administered  Medications on File Prior to Encounter   Medication Dose Route Frequency Provider Last Rate Last Dose    [DISCONTINUED] acetaminophen (TYLENOL) tablet 650 mg  650 mg Oral Q8H PRN Reuel Derby, MD   650 mg at 03/30/17 1547    [DISCONTINUED] ciprofloxacin HCl (CIPRO) tablet 500 mg  500 mg Oral 2 times per day Reuel Derby, MD   500 mg at 03/30/17 1610    [DISCONTINUED] heparin (porcine) injection 5,000 Units  5,000 Units Subcutaneous 3 times per day Knute Neu, DO        [DISCONTINUED] lidocaine (LIDODERM) 5 % 1 patch  1 patch Transdermal Q24H Reuel Derby, MD   1 patch at 03/30/17 1547    [DISCONTINUED]  melatonin Tab 3 mg  3 mg Oral Nightly PRN Reuel Derby, MD        [DISCONTINUED] morphine SR (MS CONTIN, ORAMORPH SR) 12 hr tablet 15 mg  15 mg Oral 2 times per day Reuel Derby, MD   15 mg at 03/30/17 1015    [DISCONTINUED] ondansetron (ZOFRAN) injection 4 mg  4 mg Intravenous Q8H PRN Knute Neu, DO        [DISCONTINUED] ondansetron [facility]) tablet 4 mg  4 mg Oral Q8H PRN Knute Neu, DO   4 mg at 03/29/17 1609    [DISCONTINUED] proMETHazine (PHENERGAN) injection 12.5 mg  12.5 mg Intravenous Q6H4 [facility] Reuel Derby, MD   12.5 mg at 03/30/17 1015    [DISCONTINUED] ursodiol (ACTIGALL) capsule 300 mg  300 mg Oral BID Reuel Derby, MD   300 mg at 03/30/17 1014     Current Outpatient Prescriptions on File Prior to Encounter   Medication Sig Dispense Refill    acetaminophen (TYLENOL) 325 MG tablet Take 3 tablets (975 mg total) by mouth every 8 hours as needed for Pain. 100 tablet 0    ciprofloxacin HCl (CIPRO) 500 MG tablet Take 500 mg by mouth 2 times a day.  0    [DISCONTINUED] melatonin 3 mg Tab Take 1 tablet (3 mg total) by mouth at bedtime as needed (sleep). Indications: SLEEP 30 each 0    [DISCONTINUED] lidocaine (LIDODERM) 5 % Place 1 patch onto the skin daily. Apply patch for 12 hours and then remove patch and leave off for 12 hours. 30 patch 0    [DISCONTINUED] morphine SR (MS CONTIN, ORAMORPH SR) 15 MG 12 hr tablet Take 1 tablet (15 mg total) by mouth every 12 hours for 4 days. 8 tablet 0    [DISCONTINUED] morphine SR (MS CONTIN, ORAMORPH SR) 15 MG 12 hr tablet Take 1 tablet (15 mg total) by mouth 2 times a day for 7 days. Indications: Severe Pain 14 tablet 0    [DISCONTINUED] ondansetron (ZOFRAN) 4 MG tablet Take 1 tablet (4 mg total) by mouth every 8 hours as needed. 20 tablet 0       Allergies:  Patient has no known allergies.    SH:  Social History     Social History    Marital status: Single     Spouse name: N/A    Number of children: N/A    Years of education: N/A      Occupational History    Not on file.     Social History Main Topics    Smoking status: Former Smoker    Smokeless tobacco: Never Used      Comment: 11-24-2009    Alcohol use Yes      Comment: occ  Drug use: No      Comment: 11-24-2009    Sexual activity: Not Currently     Other Topics Concern    Not on file     Social History Narrative    No narrative on file       FH:   Family History   Problem Relation Age of Onset    Heart disease Maternal Grandmother      aorta valve problem    Hypertension Maternal Grandmother     Alzheimer's disease Maternal Grandmother     Aneurysm Maternal Grandfather      brain    Hypertension Maternal Grandfather     Mental illness Other      aunt-psych issues    Other Other      2 uncles had open heart surgery    Hypertension Other      two uncles       ROS:   Review of Systems   Constitutional: Positive for activity change and fatigue. Negative for fever.   Respiratory: Positive for chest tightness. Negative for cough and shortness of breath.    Cardiovascular: Negative for chest pain.   Gastrointestinal: Positive for abdominal pain and nausea. Negative for abdominal distention, blood in stool, constipation, diarrhea and vomiting.   Genitourinary: Positive for flank pain. Negative for difficulty urinating and hematuria.   Neurological: Positive for weakness. Negative for light-headedness.   All other systems reviewed and are negative.      Vital Signs     Temp:  [97.4 F (36.3 C)-98 F (36.7 C)] 97.4 F (36.3 C)  Heart Rate:  [73-96] 78  Resp:  [16-22] 20  BP: (106-160)/(70-119) 115/82  Vitals:    03/31/17 0819   BP: 115/82   Pulse: 78   Resp: 20   Temp: 97.4 F (36.3 C)   SpO2: 100%       Date 03/30/17 0700 - 03/31/17 0659(Not Admitted) 03/31/17 0700 - 04/01/17 0659   Shift 0700-1459 1500-2259 2300-0659 24 Hour Total 0700-1459 1500-2259 2300-0659 24 Hour Total   I  N  T  A  K  E   P.O.     0   0      P.O.     0   0    Shift Total  (mL/kg)     0  (0)   0  (0)    O  U  T  P  U  T   Urine     0   0      Urine     0   0    Emesis/NG output     0   0      Emesis     0   0    Shift Total  (mL/kg)     0  (0)   0  (0)   Weight (kg)   89.1 89.1 89.1 89.1 89.1 89.1       Physical Exam     Physical Exam   Constitutional: She is oriented to person, place, and time. She appears well-developed and well-nourished.   Appears uncomfortable due to pain    HENT:   Head: Normocephalic and atraumatic.   Eyes: Conjunctivae are normal. Pupils are equal, round, and reactive to light.   Neck: Normal range of motion. Neck supple.   Cardiovascular: Normal rate.    Pulmonary/Chest: Effort normal. No respiratory distress.   Abdominal: Soft. She exhibits distension.   Marked TTP  in RUQ, Right abdomen extending towards flank and towards RLQ; no rebound tenderness; no guarding; soft, nondistended. RUQ percutaneous drain in place with scant serosanguinous bilious fluid in drainage bag.    Musculoskeletal: Normal range of motion.   Neurological: She is alert and oriented to person, place, and time.   Skin: Skin is warm and dry.       Laboratory Data           Invalid input(s): CO2ART, HBO2PE      Lab 03/31/17  0532 03/30/17  2118 03/30/17  0555 03/29/17  1221   WBC 5.4 5.3 4.4 5.7   HEMOGLOBIN 12.3 13.0 11.7 13.5   HEMATOCRIT 37.1 39.2 35.1 40.8   MEAN CORPUSCULAR VOLUME 84.6 84.6 83.9 84.2   PLATELETS 203 225 172 254         Lab 03/31/17  0532 03/30/17  2118 03/30/17  0555 03/29/17  1116   SODIUM 140 137 142 138   POTASSIUM 4.2 4.7 3.5 4.8   CHLORIDE 101 102 106 104   CO2 29 26 28 25    BUN 11 11 6* 7   CREATININE 0.70 0.71 0.67 0.70   GLUCOSE 97 99 111* 135*   CALCIUM 9.4 9.2 9.3 9.4   MAGNESIUM  --  2.1 2.1  --    PHOSPHORUS 4.9* 4.7  --   --          Lab 03/31/17  0532   INR 1.0   PROTHROMBIN TIME 13.2         Lab 03/31/17  0532 03/30/17  2118 03/30/17  0555 03/29/17  1116   ALT 65* 71* 68* 98*   AST 40* 55* 39 92*   ALK PHOS 198* 198* 169* 216*   BILIRUBIN TOTAL 0.4 0.5 0.4 0.6   BILIRUBIN DIRECT 0.1  0.1  --  0.1   ALBUMIN 3.7  3.7 3.7 3.2* 3.9         Lab 03/31/17  0205   COLOR, URINE Yellow   CLARITY Clear   PROTEIN UA Negative   PH UA 7.5   SPECIFIC GRAVITY, URINE 1.020   GLUCOSE UA Negative   BLOOD UA Trace-intact*   LEUKOCYTES UA Negative   NITRITE UA Negative   BILIRUBIN UA Negative   UROBILINOGEN UA 0.2 E.U./dL   RBC UA 1   WBC UA 2         Imaging Studies     Ct Abdomen With Iv Contrast    Result Date: 03/31/2017   Exam: CT of the abdomen and pelvis with contrast INDICATION:History of cholecystitis, percutaneous drainage placement, reevaluate drains COMPARISON: TECHNIQUE: Following the administration of intravenous contrast helically acquired axial multidetector CT images were obtained from the lung bases through the pelvis. Dose reduction techniques were employed. FINDINGS: The lung bases are clear. 2 separate biliary stents are in similar position previous exam. Percutaneous drainage catheter in the subhepatic space is unchanged in position. There is no significant residual fluid collection. The remaining visualized solid and hollow viscera are unremarkable.     IMPRESSION: No significant interval change when compared to the prior exam of 03/29/2017. Report Verified by: Leda Roys at 03/31/2017 12:29 PM EDT      Assessment and Plan   Diane Stafford is a 31 y.o. 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     Plan:   - Recommend IR drain study to assess for any fluid accumulation in the area of the drain. Since pain seems to be in the infrahepatic area/RUQ and around the drain insertion site, may consider removal of drain since drain output has decreased significantly following most recent stent  placement.   - Will discuss her case with Transplant surgery team.   - May consider a longer acting pain medicine for pain control such as methadone vs diluadid        Rowe Robert, MD  [facility]

## 2017-03-31 NOTE — Care Coordination-Inpatient (Signed)
Care Coordinator/Social Worker Michaelle Copas participated in multidisciplinary rounding on pt this morning. Per medical team pt is not medically ready for discharge home and will need further workup to determine why blood was present in her drain. Pt is anticipated to be medically ready for discharge home with no identified SW discharge needs on Monday 04/03/17 versus Tuesday 04/04/17.     SW will continue to follow this pt and to communicate with the medical team to assist with discharge planning and to address psychosocial needs as they arise.    [name], MSW, LSW  [phone]

## 2017-03-31 NOTE — Unmapped (Signed)
Department of Internal Medicine  Daily Progress Note      Chief Complaint / Reason for Follow-Up     Diane Stafford is a 31 y.o. female on hospital day 0. The principal reason for today's follow up visit is RUQ pain.    Interval History / Subjective     Pt was recently discharged yesterday. She has a history of recent cholecystectomy complicated by bile leak and stent/percutaneous drain placement. She has had chronic pain since this. She presented with pain 2/2 to not being able to fill her medications following discharge and has new onset sanguinous drainage into her percutaneous drain.    Interval Hx: Pt continues to have pain so we have escalated to dilaudid; getting a CT scan to assess for etiology of sanguinous drainage. ACS consulted.      Review of Systems (Focused)     Review of Systems   Constitutional: Negative for chills and fever.   Respiratory: Negative for shortness of breath.    Cardiovascular: Negative for chest pain.   Gastrointestinal: Positive for abdominal pain.     Medications     Scheduled Meds:  ??? ciprofloxacin HCl  500 mg Oral 2 times per day   ??? heparin (porcine)  5,000 Units Subcutaneous 3 times per day     Continuous Infusions:  PRN Meds:  HYDROmorphone **OR** HYDROmorphone, oxyCODONE-acetaminophen, proMETHazine       Vital Signs     Temp:  [97.4 ??F (36.3 ??C)-98 ??F (36.7 ??C)] 97.4 ??F (36.3 ??C)  Heart Rate:  [73-96] 78  Resp:  [16-22] 20  BP: (106-160)/(70-119) 115/82    Intake/Output Summary (Last 24 hours) at 03/31/17 1256  Last data filed at 03/31/17 0819   Gross per 24 hour   Intake                0 ml   Output                0 ml   Net                0 ml         Physical Exam     Physical Exam   Constitutional: She is oriented to person, place, and time. She appears well-developed and well-nourished. She appears distressed.   HENT:   Head: Atraumatic.   Eyes: EOM are normal. Pupils are equal, round, and reactive to light.   Neck: Normal range of motion.   Cardiovascular: Normal rate,  regular rhythm, normal heart sounds and intact distal pulses.    Pulmonary/Chest: Effort normal and breath sounds normal.   Abdominal: Soft. Bowel sounds are normal. There is tenderness.   Sanguinous drainage present in subhepatic cutaneous drain   Musculoskeletal: She exhibits no edema.   Neurological: She is alert and oriented to person, place, and time.   Skin: Skin is warm and dry.   Psychiatric: She has a normal mood and affect. Her behavior is normal.   Nursing note and vitals reviewed.      Laboratory Data         Lab 03/31/17  0532 03/30/17  2118 03/30/17  0555 03/29/17  1221   WBC 5.4 5.3 4.4 5.7   HEMOGLOBIN 12.3 13.0 11.7 13.5   HEMATOCRIT 37.1 39.2 35.1 40.8   MEAN CORPUSCULAR VOLUME 84.6 84.6 83.9 84.2   PLATELETS 203 225 172 254           Lab 03/31/17  0532 03/30/17  2118 03/30/17  0555 03/29/17  1116   SODIUM 140 137 142 138   POTASSIUM 4.2 4.7 3.5 4.8   CHLORIDE 101 102 106 104   CO2 29 26 28 25    BUN 11 11 6* 7   CREATININE 0.70 0.71 0.67 0.70   GLUCOSE 97 99 111* 135*           Lab 04-21-17  0532 03/30/17  2118 03/30/17  0555 03/29/17  1116   CALCIUM 9.4 9.2 9.3 9.4   MAGNESIUM  --  2.1 2.1  --    PHOSPHORUS 4.9* 4.7  --   --            Lab 04/21/17  0532   INR 1.0   PROTHROMBIN TIME 13.2           Lab 2017/04/21  0532 03/30/17  2118 03/30/17  0555 03/29/17  1116   ALT 65* 71* 68* 98*   AST 40* 55* 39 92*   ALK PHOS 198* 198* 169* 216*   BILIRUBIN TOTAL 0.4 0.5 0.4 0.6   BILIRUBIN DIRECT 0.1 0.1  --  0.1   ALBUMIN 3.7   3.7 3.7 3.2* 3.9           Lab April 21, 2017  0205   COLOR, URINE Yellow   CLARITY Clear   SPECIFIC GRAVITY, URINE 1.020   PH UA 7.5   PROTEIN UA Negative   GLUCOSE UA Negative   KETONES UA Negative   BILIRUBIN UA Negative   BLOOD UA Trace-intact*   NITRITE UA Negative   UROBILINOGEN UA 0.2 E.U./dL   LEUKOCYTES UA Negative   RBC UA 1   WBC UA 2             No results found for: NTPROBNP    No results found for: TSH, T3FREE, FREET4              Diagnostic Studies     Awaiting CT read on  04/22/23    Assessment & Plan     Diane Stafford is a 31 y.o. female with h/o biloma with biliary leak status post laparoscopic cholcystectomy with hepatobiliary stent and external biliary drain presenting with increased abdominal pain.     Principal Problem:    RUQ pain      #Abdominal Pain              ??The patient had cholecystectomy that was complicated by a bile leak and biloma at an OSH. This was intervened with a subhepatic percutaneous drain on a previous admission. She was recently discharged due to this pain and then represented on 04/22/2023 given her inability to fill her pain medication scripts.   -She has new onset sanguinous drainage into her percutaneous drain   -CT scan ordered and ACS consulted for recs/assessment   -Pt currently on dilaudid 1mg  PRN and phenergan PRN for pain and nausea    FEN: PO as tolerated; K >4, Mg > 2    Dispo: inpatient while determine etiology for pain        Nutrition:  Diet Orders          None          Code Status: Full Code    Signed:  Rilyn Upshaw, MD  2017-04-21, 12:56 PM

## 2017-03-31 NOTE — Unmapped (Signed)
Clinical Pharmacy Note - Transitions of Care     According to [facility] formulary web site, all opioid prescriptions require a prior authorization. Submitted PA request for morphine SR 15 mg BID through Cover My Meds. Unlikely to get approved until Monday at the earliest given the weekend is coming up.    Will follow up on Monday. Call with questions.    Fabian November, PharmD, BCPS  Clinical Pharmacy Specialist [facility]  Pager [phone]  Office: [phone]  Clinical Pharmacist On-Call Pager [phone]

## 2017-03-31 NOTE — Unmapped (Signed)
Nursing Day Shift Progress Note    Significant Events During Shift  Patient experience severe sharp pain in RUQ at beginning of shift. Dilaudid 1mg  and Phenergan12.5mg  ordered and given. Pain re-assessed in 30 minutes and patient stated pain was tolerable. Patients pain has been well controlled and tolerable for remainder of shift while receiving PRN dilaudid and phenergan.     Patient/Family Concerns  Visitors: NA   Concerns: Patient has concerns about severe pain and  bloody drainage from percutaneous drain.  Assessment  Nursing time demands: moderate  IV access: 20 Right AC  Sitter requirements:  N/A    Mental Status   AOx4      Medications  [id number] - Medications Not Given  (last 12 hrs)         ** SITE UNKNOWN **       Medication Name Action Time Action Reason Comments     heparin (porcine) injection 5,000 Units 03/31/17 0604 Not Given Other      heparin (porcine) injection 5,000 Units 03/31/17 1348 Not Given Patient/family refused

## 2017-03-31 NOTE — Unmapped (Signed)
Ready and clean bed assigned to [id number]. Pt updated on plan of care including transfer and is agreeable. Receiving RN may call [phone] to consult ED RN with questions regarding patients care. Pt is leaving the department in stable condition with all personal items in possession.   The patient does not have a patient monitor at bedside in the ED.  Most recent vitals: BP (!) 160/119 (BP Location: Left arm, Patient Position: Sitting)    Pulse 88    Temp 98 ??F (36.7 ??C) (Oral)    Resp 22    SpO2 97%     Lucrezia Starch ,RN

## 2017-03-31 NOTE — Unmapped (Signed)
MD at bedside with [place] for PIV placement

## 2017-03-31 NOTE — Unmapped (Signed)
Diane Stafford is an 31 y.o. female.    History:  Diane Stafford??is a 31 y.o.??female??with PMH of laparoscopic cholecystectomy 7/9 at [facility] c/b bile leak s/p ERCP with biliary stent placement 7/11 with biloma recurrence s/p placement of IR drain on 7/15 all at OSH. Since then, she has been managed by [name] and [name] at [facility]. Following two ERCPs with [name], it has been determined that an aberrant right posterior bile duct (that comes off the CBD just proximally to the cystic duct) was injured during the initial surgery resulting in both a bile leak and stricture. She currently has two biliary stents - one in the right posterior hepatic duct and the other in the CBD to keep the first stent in place. Since placement of these stents (8/16) her percutaneous biliary drain has put out minimal bile. She continues to have significant pain in there epigastrum and at her drainage catheter site and is requiring narcotics for control.       Past Medical History:   Diagnosis Date   ??? Menarche     age 31     Past Surgical History:   Procedure Laterality Date   ??? CESAREAN SECTION      x 4   ??? CHOLECYSTECTOMY     ??? ERCP     ??? ERCP N/A 03/05/2017    Procedure: ERCP;  Surgeon: Floreen Comber, MD;  Location: [facility];  Service: Gastroenterology;  Laterality: N/A;   ??? ERCP N/A 03/23/2017    Procedure: ERCP w/ stent exhange;  Surgeon: Floreen Comber, MD;  Location: [facility];  Service: Gastroenterology;  Laterality: N/A;   ??? TUBAL LIGATION     ??? WISDOM TOOTH EXTRACTION  2010    two     Family History   Problem Relation Age of Onset   ??? Heart disease Maternal Grandmother      aorta valve problem   ??? Hypertension Maternal Grandmother    ??? Alzheimer's disease Maternal Grandmother    ??? Aneurysm Maternal Grandfather      brain   ??? Hypertension Maternal Grandfather    ??? Mental illness Other      aunt-psych issues   ??? Other Other      2 uncles had open heart surgery   ??? Hypertension Other      two uncles     Social  History   Substance Use Topics   ??? Smoking status: Former Smoker   ??? Smokeless tobacco: Never Used      Comment: 11-24-2009   ??? Alcohol use Yes      Comment: occ     No Known Allergies    Prior to Admission:  Prescriptions Prior to Admission   Medication Sig Dispense Refill Last Dose   ??? acetaminophen (TYLENOL) 325 MG tablet Take 3 tablets (975 mg total) by mouth every 8 hours as needed for Pain. 100 tablet 0 03/30/2017 at Unknown time   ??? ciprofloxacin HCl (CIPRO) 500 MG tablet Take 500 mg by mouth 2 times a day.  0 03/30/2017 at Unknown time     Scheduled Meds:  ??? ciprofloxacin HCl  500 mg Oral 2 times per day   ??? heparin (porcine)  5,000 Units Subcutaneous 3 times per day     Continuous Infusions:  PRN Meds:HYDROmorphone **OR** HYDROmorphone, oxyCODONE-acetaminophen, proMETHazine  Principal Problem:    RUQ pain    Blood pressure 115/82, pulse 78, temperature 97.4 ??F (36.3 ??C), temperature source Oral, resp.  rate 20, height 5' 1 (1.549 m), weight 196 lb 6.4 oz (89.1 kg), SpO2 100 %.  Labs:  Review of Systems   Constitutional: Positive for activity change. Negative for appetite change, chills, diaphoresis, fatigue and fever.   HENT: Negative for ear discharge and facial swelling.    Respiratory: Negative for apnea, cough, choking and chest tightness.    Cardiovascular: Negative for chest pain, palpitations and leg swelling.   Gastrointestinal: Positive for abdominal pain. Negative for abdominal distention, blood in stool, constipation, diarrhea, nausea, rectal pain and vomiting.   Genitourinary: Negative for difficulty urinating, dyspareunia, dysuria and enuresis.   Musculoskeletal: Negative for arthralgias, back pain, gait problem, joint swelling, neck pain and neck stiffness.   Skin: Negative for color change, pallor, rash and wound.   Neurological: Negative for dizziness, facial asymmetry, light-headedness and headaches.   Hematological: Negative for adenopathy. Does not bruise/bleed easily.    Psychiatric/Behavioral: Negative for agitation, behavioral problems, confusion and decreased concentration.       Physical Exam   Constitutional: She appears well-developed and well-nourished. No distress.   HENT:   Head: Normocephalic and atraumatic.   Right Ear: External ear normal.   Left Ear: External ear normal.   Eyes: EOM are normal. Pupils are equal, round, and reactive to light.   Neck: Normal range of motion. Neck supple.   Cardiovascular: Normal rate, regular rhythm, normal heart sounds and intact distal pulses.    Pulmonary/Chest: Effort normal and breath sounds normal. No respiratory distress. She exhibits no tenderness.   Abdominal: Soft. Bowel sounds are normal. She exhibits no distension. There is tenderness. There is no rebound and no guarding.   Musculoskeletal: Normal range of motion. She exhibits no edema, tenderness or deformity.   Neurological: She is alert.   Skin: Skin is warm and dry. No rash noted. She is not diaphoretic. No erythema. No pallor.   Psychiatric: She has a normal mood and affect. Her behavior is normal. Judgment and thought content normal.       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     - Drain study - given the decreased amount coming from the percutaneous drainage catheter and pain associated with the catheter, could potentially remove  - Agree with conservative management of biliary leak/stricture at this point. If stricture continues following stent removal will evaluate for surgery, though often these patients have sufficient residual liver and do not require resection.    Please call with additional questions or concerns. Dr. Ronita Hipps is aware and will be following.      Jaycie Kregel  03/31/2017

## 2017-03-31 NOTE — Care Coordination-Inpatient (Signed)
[facility]  Care Management Department    High Risk Screen    Name: Diane Stafford  MRN:  [id number]  Date:   03/31/2017     High Risk Screen  Patient admitted from nursing home, group home or rehab facility: No  Patient is over 32 years of age and lives alone: No  Patient is active with Hospice services: No  Patient is suspected victim of abuse, neglect, violence: No  Current alcohol or drug abuse: No  Patient is homeless: No  Patient is from justice center or on police hold: No  Patient has a new diagnosis of a terminal illness: No  Patient has a new diagnosis of a chronic illness: No  Patient has multiple co-morbidities and/or>5 home medications: Yes  Patient has no PCP or medical home: No  Patient has history of dementia, mental illness or DD: No  Patient has no payer source: No  Patient has previous admission within last 30 days: Yes  Anticipate specialized home health needs, i.e., wounds, trach, cellulitis, ostomy, tube-feeds, TPN, DME: No  Score: 5    Assignment of Risk Level  Patient has been screened by Care Management and meets High Risk Indicators, however no psychosocial assessment to follow due to this SW completing full psychosocial assessment with patient at bedside yesterday 03/30/17 prior to patient's discharge. Please see SW's consult note from that date for further detail. Pt is a 31 year old female with minimal barriers to wellness. Per chart review, pt is readmitted because she was unable to fill her prescription pain medications at discharge yesterday and returned to the hospital when her pain became more intense. SW will continue to work closely with pt and multidisciplinary team to determine what steps can be taken to prevent readmission and to develop a safe and appropriate discharge plan (Score of > 4).    Diane Stafford, MSW, [place]  [phone]

## 2017-03-31 NOTE — Unmapped (Signed)
[place] [facility]  [facility]   History and Physical  03/31/2017  6:41 AM    Patient:  Diane Stafford  MRN:   [id number]  Room:   5409/W1191    Admitting Physician: Langley Adie, MD;Richard*  Date of Admit: 03/31/2017 12:51 AM, Length of Stay: 0 days  PCP: Santiago Glad (Inactive)    Subjective:     Chief Complaint:  ruq pain    History of Present Illness: Diane Stafford is a 31 y.o. female with h/o biloma with biliary leak status post laparoscopic cholcystectomy with hepatobiliary stent and external biliary drain presenting with increased abdominal pain.    She originally had a lap chole on 7/9 at [facility], which was then complicated by a bile leak on 7/11. Drain was placed 7/15 for a biloma and this leak. She then presented to [facility] 7/28 for a second opinion after her symptoms failed to resolve, symptoms of which included bilious vomiting, nausea, RUQ pain that was persistent. Drain was putting out 400-500 cc. During this admission, multiple bile leaks around the percutaneous drainage cather were found requiring biliary stenting, after which she was discharged with her drain in place on 8/2 with plans for an IR abscessogram f/u, and then GI f/u 6-8 weeks after this.     7/9: [facility].   Compliacted by bile leak and biloma.  02/15/17: Dr Shayne Alken placed aa plastic biliary stent via ERCP.  02/19/17 She underwent percutaneous biliary drain placement for biloma.   Later she was admitted to 7/28 for a second opinion and continued RUQ pain, bilious emesis, and persistent bilious drainage from the percutaneous drain.   ERCP at [facility] 7/29 revealed what appeared to be two cystic duct remnants with biliary leak. An 8 mm x 80 mm fully covered Wallflex biliary metal stent was placed.  However output from the percutaneous drain increased from 400 to 700 mL/day, and she presented to [facility] 8/16 again.   8/16: removal of metal biliary stent. There was abberrant take off of  the right poseterior hepatic duct. The right posterior duct stricture / leak was dilated using a 4 mm biliary dilation balloon, and a plastic biliary stent was placed.   She was discharged home with plan for F/u ERCP and removal/ exchange of the biliary stent in 6-8 weeks.     She could not get her MS contin and she reports continued pain.           Cardiac History:  LHC:   TTE: No results found for this or any previous visit.No results found for this or any previous visit.No results found for this or any previous visit.No results found for this or any previous visit.  TEE: No results found for this or any previous visit.  Stress Test: No results found for this or any previous visit. No results found for this or any previous visit. No results found for this or any previous visit.   Card MRI: No results found for this or any previous visit.     Past Medical History:  Past Medical History:   Diagnosis Date   ??? Menarche     age 54       Past Surgical History:  Past Surgical History:   Procedure Laterality Date   ??? CESAREAN SECTION      x 4   ??? CHOLECYSTECTOMY     ??? ERCP     ??? ERCP N/A 03/05/2017  Procedure: ERCP;  Surgeon: Floreen Comber, MD;  Location: [facility];  Service: Gastroenterology;  Laterality: N/A;   ??? ERCP N/A 03/23/2017    Procedure: ERCP w/ stent exhange;  Surgeon: Floreen Comber, MD;  Location: [facility];  Service: Gastroenterology;  Laterality: N/A;   ??? TUBAL LIGATION     ??? WISDOM TOOTH EXTRACTION  2010    two       Medications:   Prescriptions Prior to Admission   Medication Sig Dispense Refill Last Dose   ??? acetaminophen (TYLENOL) 325 MG tablet Take 3 tablets (975 mg total) by mouth every 8 hours as needed for Pain. 100 tablet 0 03/30/2017 at Unknown time   ??? ciprofloxacin HCl (CIPRO) 500 MG tablet Take 500 mg by mouth 2 times a day.  0 03/30/2017 at Unknown time       Code Status:  Full Code    Allergies: No Known Allergies    Social history:   Social History   Substance Use Topics   ??? Smoking  status: Former Smoker   ??? Smokeless tobacco: Never Used      Comment: 11-24-2009   ??? Alcohol use Yes      Comment: occ       Family history:   Family History   Problem Relation Age of Onset   ??? Heart disease Maternal Grandmother      aorta valve problem   ??? Hypertension Maternal Grandmother    ??? Alzheimer's disease Maternal Grandmother    ??? Aneurysm Maternal Grandfather      brain   ??? Hypertension Maternal Grandfather    ??? Mental illness Other      aunt-psych issues   ??? Other Other      2 uncles had open heart surgery   ??? Hypertension Other      two uncles       Review of Systems:  General:  No weight loss; No fever  Skin:   No itching; No rashes  Head/Eyes:  No headache; No visual changes  ENT:   No hearing changes; No nasal congestion  Respiratory:   No dyspnea; No cough  CV:   No chest pain; No ankle edema  GI:   + nausea ; No melena ; +ruq pain   GU:   No dysuria; No hematuria  MSK:   No muscle weakness; No joint pain  Heme/Lymph:  No easy bruising; No blood transfusions   Endocrine:  No heat/cold intolerance; No polyuria  Neurologic:  No fainting; No seizures  Psychiatric:  No anxiety; No depression  Allg/Rheum:  No seasonal allergies; No joint stiffness    Objective:   Temp:  [97.4 ??F (36.3 ??C)-98 ??F (36.7 ??C)] 97.4 ??F (36.3 ??C)  Heart Rate:  [73-96] 96  Resp:  [16-22] 22  BP: (106-160)/(70-119) 123/88  No intake or output data in the 24 hours ending 03/31/17 0641  No intake/output data recorded.  Wt Readings from Last 3 Encounters:   03/31/17 196 lb 6.4 oz (89.1 kg)   03/29/17 194 lb 6 oz (88.2 kg)   03/23/17 190 lb (86.2 kg)       Physical Exam  General Appearance:    Alert, cooperative, mild distress, appears stated age  Neck:    No JVD, No evidence of thyroid enlargement or lymphadenopathy.  Lungs:     Clear to auscultation bilaterally, respirations unlabored  Heart:    Regular rate and rhythm, S1 and S2 normal, no murmur, rub   or gallop  Abdomen:  Soft, Non-tender, +biliary drain from the ruq with no  surrounding erythema, with some biliary green yellow color   Extremities: Extremities normal, atraumatic, no cyanosis or edema.   Pulses:   2+ and symmetric all extremities  Skin:   Skin color, texture, turgor normal, no rashes or lesions  Neuro : A&O x3, gcs 15  Psych: answering questions appropriately, otherwise normal affect     Laboratory Data:  No results for input(s): TROPONINI, POCTNI, POCTROP, CKMB, BNP, NTPROBNP in the last 72 hours.  Recent Labs      03/29/17   1116  03/30/17   0555  03/30/17   2118  03/31/17   0532   NA  138  142  137  140   K  4.8  3.5  4.7  4.2   CL  104  106  102  101   CO2  25  28  26  29    BUN  7  6*  11  11   CREATININE  0.70  0.67  0.71  0.70   GLUCOSE  135*  111*  99  97   CALCIUM  9.4  9.3  9.2  9.4   MG   --   2.1  2.1   --    PHOS   --    --   4.7  4.9*     Recent Labs      03/29/17   1221  03/30/17   0555  03/30/17   2118  03/31/17   0532   WBC  5.7  4.4  5.3  5.4   HGB  13.5  11.7  13.0  12.3   HCT  40.8  35.1  39.2  37.1   PLT  254  172  225  203   INR   --    --    --   1.0   PROTIME   --    --    --   13.2     No results for input(s): CHOLTOT, TRIG, HDL, CHOLHDL, LDL in the last 72 hours.    Invalid input(s): VLDCHOL  Recent Labs      03/29/17   1116  03/30/17   0555  03/30/17   2118  03/31/17   0532   AST  92*  39  55*  40*   ALT  98*  68*  71*  65*   ALKPHOS  216*  169*  198*  198*   BILITOT  0.6  0.4  0.5  0.4   BILIDIRECT  0.1   --   0.1  0.1     No results found for: TSH, T3TOTAL, T4TOTAL, T3FREE, FREET4, THYROIDAB  No results for input(s): FOLATE in the last 72 hours.    Invalid input(s): VITAMINB1  Recent Labs      03/31/17   0205   COLORU  Yellow   CLARITYU  Clear   PROTEINUA  Negative   PHUR  7.5   LABSPEC  1.020   GLUCOSEU  Negative   BLOODU  Trace-intact*   LEUKOCYTESUR  Negative   NITRITE  Negative   BILIRUBINUR  Negative   UROBILINOGEN  0.2 E.U./dL   RBCUA  1   WBCUA  2     No results found for: METHADSCRNUR, METHAMSCRNUR, TCASCRNUR, AMPHETSCRNUR, BARBSCRNUR,  BENZOSCRNUR, OPIATESCRNUR, PHENSCRNUR, THCSCRNUR, COCAINSCRNUR     Admit ECG:    Radiology:   No results found.    Assessment & Plan:  RYE DORADO is a 31 y.o. female with h/o biloma with biliary leak status post laparoscopic cholcystectomy with hepatobiliary stent and external biliary drain presenting with increased abdominal pain.       #ruq pain s/p multiple biliary stent placements, most recently 8/16  Giving pain meds, percocet. Not controlling.  MEDS: perc5-325mg  q 4h  Phenergan 12.5 mg po   2 mg iv dilauded  cirpfloxacin 500 mg  zofran iv 8 mg     dispo: pain control for now; can be discharged once insurance issue with her pain medication can be elucidated.       Diet:   Diet Orders          None        DVT Prx:  heparin SQ q8hrs  Code Status: Full Code   Primary Emergency Contact: Dyson,Joshua, Home Phone: [phone]    [facility], DO   03/31/2017 6:41 AM

## 2017-03-31 NOTE — Unmapped (Signed)
Ambulatory to bathroom.

## 2017-04-01 LAB — CBC
Hematocrit: 35.8 % (ref 35.0–45.0)
Hemoglobin: 12 g/dL (ref 11.7–15.5)
MCH: 28.1 pg (ref 27.0–33.0)
MCHC: 33.4 g/dL (ref 32.0–36.0)
MCV: 84.1 fL (ref 80.0–100.0)
MPV: 7.8 fL (ref 7.5–11.5)
Platelets: 186 10*3/uL (ref 140–400)
RBC: 4.26 10*6/uL (ref 3.80–5.10)
RDW: 16.6 % (ref 11.0–15.0)
WBC: 4.8 10*3/uL (ref 3.8–10.8)

## 2017-04-01 LAB — COMPREHENSIVE METABOLIC PANEL
ALT: 55 U/L (ref 7–52)
AST: 37 U/L (ref 13–39)
Albumin: 3.4 g/dL (ref 3.5–5.7)
Alkaline Phosphatase: 172 U/L (ref 36–125)
Anion Gap: 6 mmol/L (ref 3–16)
BUN: 10 mg/dL (ref 7–25)
CO2: 27 mmol/L (ref 21–33)
Calcium: 9.5 mg/dL (ref 8.6–10.3)
Chloride: 103 mmol/L (ref 98–110)
Creatinine: 0.71 mg/dL (ref 0.60–1.30)
Glucose: 96 mg/dL (ref 70–100)
Osmolality, Calculated: 281 mOsm/kg (ref 278–305)
Potassium: 4.1 mmol/L (ref 3.5–5.3)
Sodium: 136 mmol/L (ref 133–146)
Total Bilirubin: 0.5 mg/dL (ref 0.0–1.5)
Total Protein: 6.4 g/dL (ref 6.4–8.9)
eGFR AA CKD-EPI: >90 See note.
eGFR NONAA CKD-EPI: >90 See note.

## 2017-04-01 MED ORDER — proMETHazine (PHENERGAN) tablet 25 mg
25 | Freq: Once | ORAL | Status: AC | PRN
Start: 2017-04-01 — End: 2017-04-01
  Administered 2017-04-02: 03:00:00 25 mg via ORAL

## 2017-04-01 MED ORDER — HYDROmorphone (DILAUDID) tablet 4 mg
4 | Freq: Once | ORAL | Status: AC
Start: 2017-04-01 — End: 2017-04-01
  Administered 2017-04-02: 03:00:00 4 mg via ORAL

## 2017-04-01 MED FILL — HYDROMORPHONE 1 MG/ML INJECTION SYRINGE: 1 1 mg/mL | INTRAMUSCULAR | Qty: 1

## 2017-04-01 MED FILL — PROMETHAZINE 25 MG/ML INJECTION SOLUTION: 25 25 mg/mL | INTRAMUSCULAR | Qty: 1

## 2017-04-01 MED FILL — PROMETHAZINE 25 MG TABLET: 25 25 MG | ORAL | Qty: 1

## 2017-04-01 MED FILL — HYDROMORPHONE 4 MG TABLET: 4 4 MG | ORAL | Qty: 1

## 2017-04-01 MED FILL — CIPROFLOXACIN 500 MG TABLET: 500 500 MG | ORAL | Qty: 1

## 2017-04-01 MED FILL — HEPARIN (PORCINE) 5,000 UNIT/ML INJECTION SOLUTION: 5000 5,000 unit/mL | INTRAMUSCULAR | Qty: 1

## 2017-04-01 NOTE — Unmapped (Signed)
CMU order reconciled at this time [facility]

## 2017-04-01 NOTE — Unmapped (Signed)
[facility]  Medical Nutrition Therapy    Reason(s) for Completion: Physician/Nursing Referral - decreased intake    Diet Order/Nutrition Support: Regular    Pertinent Information: 31 y.o. female with h/o biloma with biliary leak status post laparoscopic cholecystectomy 7/9 with hepatobiliary stent and external biliary drain presenting with increased abdominal pain. Was not able to fill her medications following recent discharge. Pt with nausea, no v/d/c.    Spoke with pt at bedside. Reports she is feeling better and eating well now. Nausea has decreased with medication. Stated UBW 220 lb, reports 30 lb weight loss, although has gained some back. Weight loss started during first hospital admission 7/9.     Patient Active Problem List   Diagnosis   ??? Urinary tract infection, site not specified   ??? Obesity   ??? 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 Routine postpartum follow-up   ??? Previous cesarean delivery, antepartum condition or complication   ??? Abscess of abdominal cavity (CMS Dx)   ??? Bile leak   ??? Bile duct leak   ??? Bile leak, postoperative   ??? Abdominal pain   ??? Postprocedural leakage from bile duct   ??? RUQ pain   ??? Biloma following surgery     Past Medical History:   Diagnosis Date   ??? Menarche     age 89       Scheduled Meds:   ??? ciprofloxacin HCl  500 mg Oral 2 times per day   ??? heparin (porcine)  5,000 Units Subcutaneous 3 times per day      Continuous Infusions:   PRN Meds:HYDROmorphone **OR** HYDROmorphone, oxyCODONE-acetaminophen, proMETHazine     Pertinent Labs:   Lab Results   Component Value Date    CREATININE 0.71 04/01/2017    BUN 10 04/01/2017    NA 136 04/01/2017    K 4.1 04/01/2017    CL 103 04/01/2017    CO2 27 04/01/2017     Lab Results   Component Value Date    ALBUMIN 3.4 (L) 04/01/2017     Lab Results   Component Value Date    PREALBUMIN 23.0 03/05/2017     Lab Results   Component  Value Date    CALCIUM 9.5 04/01/2017    PHOS 4.9 (H) 03/31/2017     Lab Results   Component Value Date    MG 2.1 03/30/2017     Lab Results   Component Value Date    CRP 12.8 (H) 03/05/2017       Skin Integrity: intact  Braden Score: 23, no risk for skin breakdown  Edema: none noted    Potential Nutrition Related Factor(s):  Nausea  Food Allergies/Intolerances: NKFA  Cultural Requests: none noted at this time    31 y.o.   Female   Ht Readings from Last 1 Encounters:   03/31/17 5' 1 (1.549 m)     Wt Readings from Last 1 Encounters:   04/01/17 197 lb 4.8 oz (89.5 kg)        Body mass index is 37.28 kg/m??. Obese  Usual Weight: 220 lb (100 kg)  Ideal Body Weight: 105 lb (47.7 kg) +/- 10%  Other: 188% IBW  Weight History:   Wt Readings from Last 5 Encounters:   04/01/17 197 lb 4.8 oz (89.5 kg)   03/29/17 194 lb 6 oz (88.2 kg)   03/23/17 190 lb (86.2 kg)  03/22/17 196 lb (88.9 kg)   03/05/17 190 lb (86.2 kg)     Nutrition Related Problems:   Nutrition Diagnosis: inadequate oral intake  Related To: nausea, complications after cholecystectomy  As Evidenced By: weight loss from UBW 220 lb in past 1-2 months    Estimated Nutrition Needs:   Needs based On: 89.5 kg CBW  Kcals/day: 2841-3244 (MSJ equation, 1.1-1.2)  Protein g/day: 72 (1.5 g/kg IBW)  Carbohydrate g/day: not a diabetic  Fluid ml/day: ~ 1 ml/kcal or per MD    Recommended Interventions: Monitor PO Intake/Tolerance  Goals: Consistent PO intake at 75-100% of meals during hospital admission    Nutrition Transition of Care Plan: Discharge plan of care for nutrition ongoing pending clinical course    Nutrition Status Classification: Moderately Compromised    Follow up per protocol while inpatient.    Recommendation(s) to Physician:   Monitor PO intake and POC, re-assess as needed      Mirian Mo RD, LD  Clinical Dietitian

## 2017-04-01 NOTE — Progress Notes (Signed)
Department of Internal Medicine  Daily Progress Note      Chief Complaint / Reason for Follow-Up     Diane Stafford is a 31 y.o. female on hospital day 0. The principal reason for today's follow up visit is RUQ pain.    Interval History / Subjective     No events overnight. She is significantly more comfortable with dilaudid 1-2 mg PRN. Minimal nausea. No real other complaints. Says surgery saw her this morning and discussed removal of stents. Minimal nausea. No fevers/chills.    Review of Systems (Focused)     Review of Systems   Constitutional: Negative for chills and fever.   Respiratory: Negative for shortness of breath.    Cardiovascular: Negative for chest pain.   Gastrointestinal: Positive for abdominal pain.     Medications     Scheduled Meds:   ciprofloxacin HCl  500 mg Oral 2 times per day    heparin (porcine)  5,000 Units Subcutaneous 3 times per day     Continuous Infusions:  PRN Meds:  HYDROmorphone **OR** HYDROmorphone, oxyCODONE-acetaminophen, proMETHazine       Vital Signs     Temp:  [97.4 F (36.3 C)-98.3 F (36.8 C)] 97.5 F (36.4 C)  Heart Rate:  [58-90] 80  Resp:  [18-20] 18  BP: (96-118)/(50-80) 96/68    Intake/Output Summary (Last 24 hours) at 04/01/17 1327  Last data filed at 04/01/17 1116   Gross per 24 hour   Intake             1220 ml   Output             1750 ml   Net             -530 ml         Physical Exam     Physical Exam   Constitutional: She is oriented to person, place, and time. She appears well-developed and well-nourished. No distress.   HENT:   Head: Atraumatic.   Eyes: EOM are normal. Pupils are equal, round, and reactive to light.   Neck: Normal range of motion.   Cardiovascular: Normal rate, regular rhythm, normal heart sounds and intact distal pulses.    Pulmonary/Chest: Effort normal and breath sounds normal.   Abdominal: Soft. Bowel sounds are normal. There is tenderness (primarily in RUQ surrounding perc drain site. No surrounding erythema, warmth, or swelling.).    Now with minimal yellow drainage, no blood.   Musculoskeletal: She exhibits no edema.   Neurological: She is alert and oriented to person, place, and time.   Skin: Skin is warm and dry.   Psychiatric: She has a normal mood and affect. Her behavior is normal.   Nursing note and vitals reviewed.      Laboratory Data         Lab 04/01/17  0501 03/31/17  0532 03/30/17  2118 03/30/17  0555   WBC 4.8 5.4 5.3 4.4   HEMOGLOBIN 12.0 12.3 13.0 11.7   HEMATOCRIT 35.8 37.1 39.2 35.1   MEAN CORPUSCULAR VOLUME 84.1 84.6 84.6 83.9   PLATELETS 186 203 225 172           Lab 04/01/17  0501 03/31/17  0532 03/30/17  2118 03/30/17  0555   SODIUM 136 140 137 142   POTASSIUM 4.1 4.2 4.7 3.5   CHLORIDE 103 101 102 106   CO2 27 29 26 28    BUN 10 11 11  6*   CREATININE 0.71 0.70 0.71  0.67   GLUCOSE 96 97 99 111*           Lab 04/01/17  0501 03/31/17  0532 03/30/17  2118 03/30/17  0555   CALCIUM 9.5 9.4 9.2 9.3   MAGNESIUM  --   --  2.1 2.1   PHOSPHORUS  --  4.9* 4.7  --            Lab 03/31/17  0532   INR 1.0   PROTHROMBIN TIME 13.2           Lab 04/01/17  0501 03/31/17  0532 03/30/17  2118 03/30/17  0555 03/29/17  1116   ALT 55* 65* 71* 68* 98*   AST 37 40* 55* 39 92*   ALK PHOS 172* 198* 198* 169* 216*   BILIRUBIN TOTAL 0.5 0.4 0.5 0.4 0.6   BILIRUBIN DIRECT  --  0.1 0.1  --  0.1   ALBUMIN 3.4* 3.7  3.7 3.7 3.2* 3.9           Lab 03/31/17  0205   COLOR, URINE Yellow   CLARITY Clear   SPECIFIC GRAVITY, URINE 1.020   PH UA 7.5   PROTEIN UA Negative   GLUCOSE UA Negative   KETONES UA Negative   BILIRUBIN UA Negative   BLOOD UA Trace-intact*   NITRITE UA Negative   UROBILINOGEN UA 0.2 E.U./dL   LEUKOCYTES UA Negative   RBC UA 1   WBC UA 2       Diagnostic Studies     Awaiting CT read on 8/24    Assessment & Plan     Diane Stafford is a 31 y.o. female with h/o biloma with biliary leak status post laparoscopic cholcystectomy with hepatobiliary stent and external biliary drain presenting with increased abdominal pain.     Principal Problem:     RUQ pain  Active Problems:    Biloma following surgery      #Abdominal Pain  Lap chole on 7/9 that was complicated by a bile leak s/p ERCP and biliary stent placement on 7/11 with subsequent biloma requiring IR drainage on 7/15 at OSH. Came to UC on 7/28 with continued pain, bilious emesis and found to have persistent cystic duct remnants with persistent bile leakage which was treated with new metal stent on 7/29. This was then replaced by 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 She was recently discharged (8/23) due to this pain and then represented on 8/24 given her inability to fill her pain medication scripts (because of [facility]). She had new onset serosanguinous drainage and may represent inflammation of existing stents.  - CT scan showing no residual fluid collection where perc drain sits in subhepatic space  - 50 cc of serosanguinous drain output in past 24 hours, this morning it appears yellow  - Liver enzymes and alk phos are stable  - Pain currently well controlled on dilaudid 1-2 mg q4 PRN and phenergan PRN for pain and nausea  - Will try to gradually switch to PO as needed medications  - Continuing ciprofloxacin 500 mg BID for prophylaxis  - Will discuss with surgical services regarding whether or not this patient requires any further procedures or may just need ongoing pain control until the intervened upon bile leak heals  - ACS wanting to remove perc drain if output is low (as it may be a  source of pain), but as long as it is draining, will need to stay in    FEN: PO as tolerated; K >4, Mg > 2    Dispo: inpatient while determine etiology for pain      Nutrition:  Diet Orders          Diet regular starting at 08/24 1537          Code Status: Full Code    Signed:  Daryel November, MD  04/01/2017, 1:27 PM

## 2017-04-01 NOTE — Unmapped (Signed)
Pt resting in bed, up ad lib.  Continues to take IV dilaudid and phenergan Q 4 hours

## 2017-04-01 NOTE — Progress Notes (Signed)
SURGERY PROGRESS NOTE  Admit Date: 03/31/2017  OR/Admit Date:       Subjective:   No acute events overnight. Pain improved, Afebrile, HDS.   Tolerating diet.    Objective:   Location could not be evaluated. This SmartLink does not work with rows of the type: String Type  BP  Min: 105/50  Max: 118/80  Pulse  Avg: 75.6  Min: 58  Max: 90  Temp  Avg: 97.9 F (36.6 C)  Min: 97.4 F (36.3 C)  Max: 98.3 F (36.8 C)  SpO2  Avg: 99.4 %  Min: 98 %  Max: 100 %  Blood pressure 111/69, pulse 58, temperature 97.4 F (36.3 C), temperature source Oral, resp. rate 19, height 5' 1 (1.549 m), weight 197 lb 4.8 oz (89.5 kg), SpO2 98 %.    Intake/Output Summary (Last 24 hours) at 04/01/17 1111  Last data filed at 04/01/17 0511   Gross per 24 hour   Intake              980 ml   Output             1750 ml   Net             -770 ml     I/O last 3 completed shifts:  In: 980 [P.O.:980]  Out: 1750 [Urine:1700; Drains:50]    Physical Exam  GEN: NAD, comfortable  CV: RRR  RESP: unlabored, normal inspiratory effort  ABD: soft, mildly RUQ tender, mildly distended, no rebound or guarding  Drain: no drainage in RUQ percutaneous drain  EXT: wwp    Recent Labs      03/30/17   2118  03/31/17   0532  04/01/17   0501   WBC  5.3  5.4  4.8   HGB  13.0  12.3  12.0   HCT  39.2  37.1  35.8   MCV  84.6  84.6  84.1   PLT  225  203  186     Recent Labs      03/30/17   2118  03/31/17   0532  04/01/17   0501   NA  137  140  136   K  4.7  4.2  4.1   CL  102  101  103   CO2  26  29  27    PHOS  4.7  4.9*   --    BUN  11  11  10    CREATININE  0.71  0.70  0.71   CALCIUM  9.2  9.4  9.5     No results for input(s): POCGLU, POCGMD in the last 72 hours.    Invalid input(s): GLU  Recent Labs      03/29/17   1116   03/30/17   2118  03/31/17   0532  04/01/17   0501   BILITOT  0.6   < >  0.5  0.4  0.5   AST  92*   < >  55*  40*  37   ALT  98*   < >  71*  65*  55*   ALKPHOS  216*   < >  198*  198*  172*   LIPASE  9   --   7   --    --     < > = values in this interval not  displayed.     Recent Labs      03/31/17   0532  INR  1.0   PROTIME  13.2       Current Medications:  Scheduled Medications:    ciprofloxacin HCl 500 mg 2 times per day   heparin (porcine) 5,000 Units 3 times per day      IV Meds:      PRN Medications:    HYDROmorphone 1 mg Q4H PRN   Or     HYDROmorphone 2 mg Q4H PRN   oxyCODONE-acetaminophen 1 tablet Q4H PRN   proMETHazine 12.5 mg Q4H PRN           Assessment / Plan:   Diane Stafford is a 31 y.o. female prior lap chole at [facility]. E's complicated by bile leak, ERCP and IR drainage of biloma.  Later diagnosed to have right hepatic duct injury after transfer to [facility] for persistent bilious drainage consistent with biliary ductal disruption.     CT: no significant fluid collection    Recs:  - Plan for IR drain study Monday  - will require outpt follow up with transplant surgery   - pain control with methadone in preparation for discharge, DC dilaudid    FULL CODE Status    Algis Liming, MD  General Surgery, PGY-1  Pager: [phone]  04/01/2017

## 2017-04-02 LAB — HEPATIC FUNCTION PANEL
ALT: 51 U/L (ref 7–52)
AST: 38 U/L (ref 13–39)
Albumin: 3.3 g/dL (ref 3.5–5.7)
Alkaline Phosphatase: 189 U/L (ref 36–125)
Bilirubin, Direct: 0.12 mg/dL (ref 0.00–0.40)
Bilirubin, Indirect: 0.28 mg/dL (ref 0.00–1.10)
Total Bilirubin: 0.4 mg/dL (ref 0.0–1.5)
Total Protein: 6.5 g/dL (ref 6.4–8.9)

## 2017-04-02 LAB — CBC
Hematocrit: 36.6 % (ref 35.0–45.0)
Hemoglobin: 12.2 g/dL (ref 11.7–15.5)
MCH: 28.1 pg (ref 27.0–33.0)
MCHC: 33.3 g/dL (ref 32.0–36.0)
MCV: 84.4 fL (ref 80.0–100.0)
MPV: 7.9 fL (ref 7.5–11.5)
Platelets: 197 10*3/uL (ref 140–400)
RBC: 4.34 10*6/uL (ref 3.80–5.10)
RDW: 16.7 % (ref 11.0–15.0)
WBC: 4.7 10*3/uL (ref 3.8–10.8)

## 2017-04-02 LAB — RENAL FUNCTION PANEL W/EGFR
Albumin: 3.3 g/dL (ref 3.5–5.7)
Anion Gap: 8 mmol/L (ref 3–16)
BUN: 12 mg/dL (ref 7–25)
CO2: 23 mmol/L (ref 21–33)
Calcium: 9 mg/dL (ref 8.6–10.3)
Chloride: 106 mmol/L (ref 98–110)
Creatinine: 0.74 mg/dL (ref 0.60–1.30)
Glucose: 120 mg/dL (ref 70–100)
Osmolality, Calculated: 285 mOsm/kg (ref 278–305)
Phosphorus: 3.9 mg/dL (ref 2.1–4.7)
Potassium: 3.9 mmol/L (ref 3.5–5.3)
Sodium: 137 mmol/L (ref 133–146)
eGFR AA CKD-EPI: >90 See note.
eGFR NONAA CKD-EPI: >90 See note.

## 2017-04-02 LAB — PROTIME-INR
INR: 1 (ref 0.9–1.1)
Protime: 13.4 seconds (ref 11.8–14.8)

## 2017-04-02 LAB — MAGNESIUM: Magnesium: 2.1 mg/dL (ref 1.5–2.5)

## 2017-04-02 MED ORDER — HYDROmorphone (DILAUDID) tablet 4 mg
4 | Freq: Once | ORAL | Status: AC
Start: 2017-04-02 — End: 2017-04-02
  Administered 2017-04-02: 11:00:00 4 mg via ORAL

## 2017-04-02 MED ORDER — HYDROmorphone (DILAUDID) tablet 4 mg
4 | ORAL | Status: AC | PRN
Start: 2017-04-02 — End: 2017-04-03
  Administered 2017-04-02 – 2017-04-03 (×6): 4 mg via ORAL

## 2017-04-02 MED ORDER — gabapentin (NEURONTIN) capsule 100 mg
100 | Freq: Three times a day (TID) | ORAL | Status: AC
Start: 2017-04-02 — End: 2017-04-03
  Administered 2017-04-02 – 2017-04-03 (×3): 100 mg via ORAL

## 2017-04-02 MED ORDER — proMETHazine (PHENERGAN) tablet 12.5 mg
25 | ORAL | Status: AC | PRN
Start: 2017-04-02 — End: 2017-04-04
  Administered 2017-04-02 – 2017-04-03 (×6): 12.5 mg via ORAL

## 2017-04-02 MED FILL — HYDROMORPHONE 4 MG TABLET: 4 4 MG | ORAL | Qty: 1

## 2017-04-02 MED FILL — CIPROFLOXACIN 500 MG TABLET: 500 500 MG | ORAL | Qty: 1

## 2017-04-02 MED FILL — GABAPENTIN 100 MG CAPSULE: 100 100 MG | ORAL | Qty: 1

## 2017-04-02 MED FILL — PROMETHAZINE 25 MG TABLET: 25 25 MG | ORAL | Qty: 1

## 2017-04-02 MED FILL — HEPARIN (PORCINE) 5,000 UNIT/ML INJECTION SOLUTION: 5000 5,000 unit/mL | INTRAMUSCULAR | Qty: 1

## 2017-04-02 NOTE — Unmapped (Signed)
SURGERY PROGRESS NOTE  Admit Date: 03/31/2017  OR/Admit Date:       Subjective:   No acute events overnight. Pain well controlled, Afebrile, HDS.   Tolerating diet, voiding.    Objective:   Location could not be evaluated. This SmartLink does not work with rows of the type: String Type  BP  Min: 96/68  Max: 130/73  Pulse  Avg: 76  Min: 58  Max: 87  Temp  Avg: 97.8 ??F (36.6 ??C)  Min: 97.4 ??F (36.3 ??C)  Max: 98.7 ??F (37.1 ??C)  SpO2  Avg: 99.3 %  Min: 98 %  Max: 100 %  Blood pressure 130/73, pulse 71, temperature 97.7 ??F (36.5 ??C), temperature source Oral, resp. rate 18, height 5' 1 (1.549 m), weight 197 lb 4.8 oz (89.5 kg), SpO2 100 %.    Intake/Output Summary (Last 24 hours) at 04/02/17 0720  Last data filed at 04/02/17 0014   Gross per 24 hour   Intake              960 ml   Output                0 ml   Net              960 ml     I/O last 3 completed shifts:  In: 1680 [P.O.:1680]  Out: 850 [Urine:800; Drains:50]    Physical Exam  GEN: NAD, comfortable  CV: RRR  RESP: unlabored, normal inspiratory effort  ABD: soft, mildly RUQ tender, mildly distended, no rebound or guarding  Drain: bile-stained clear fluid in RUQ percutaneous drain  EXT: wwp    Recent Labs      03/31/17   0532  04/01/17   0501  04/02/17   0428   WBC  5.4  4.8  4.7   HGB  12.3  12.0  12.2   HCT  37.1  35.8  36.6   MCV  84.6  84.1  84.4   PLT  203  186  197     Recent Labs      03/30/17   2118  03/31/17   0532  04/01/17   0501  04/02/17   0428   NA  137  140  136  137   K  4.7  4.2  4.1  3.9   CL  102  101  103  106   CO2  26  29  27  23    PHOS  4.7  4.9*   --   3.9   BUN  11  11  10  12    CREATININE  0.71  0.70  0.71  0.74   CALCIUM  9.2  9.4  9.5  9.0     No results for input(s): POCGLU, POCGMD in the last 72 hours.    Invalid input(s): GLU  Recent Labs      03/30/17   2118  03/31/17   0532  04/01/17   0501  04/02/17   0428   BILITOT  0.5  0.4  0.5  0.4   AST  55*  40*  37  38   ALT  71*  65*  55*  51   ALKPHOS  198*  198*  172*  189*   LIPASE  7    --    --    --      Recent Labs      03/31/17   0532  04/02/17   0428  INR  1.0  1.0   PROTIME  13.2  13.4       Current Medications:  Scheduled Medications:    ciprofloxacin HCl 500 mg 2 times per day   heparin (porcine) 5,000 Units 3 times per day      IV Meds:      PRN Medications:    HYDROmorphone 1 mg Q4H PRN   Or     HYDROmorphone 2 mg Q4H PRN   oxyCODONE-acetaminophen 1 tablet Q4H PRN   proMETHazine 12.5 mg Q4H PRN           Assessment / Plan:   Diane Stafford is a 31 y.o. female prior lap chole at [facility]. E's complicated by bile leak, ERCP and IR drainage of biloma.  Later diagnosed to have right hepatic duct injury after transfer to [facility] for persistent bilious drainage consistent with biliary ductal disruption.     CT: no significant fluid collection    Recs:  - Plan for HIDA and IR drain study Monday  - will require outpt follow up with transplant surgery   - pain control with methadone in preparation for discharge, DC dilaudid  - will continue to follow    FULL CODE Status    Algis Liming, MD  General Surgery, PGY-1  Pager: [phone]  04/02/2017

## 2017-04-02 NOTE — Progress Notes (Signed)
Nursing Overnight Progress Note    Significant Events During Shift  Patient does not have IV access. Request was made for [place] IV placement. One time order for PO form of IV meds    Patient/Family Concerns  Evening/Overnight Visitors: none  Concerns: none    Assessment  Nursing time demands: low  IV access: unable to obtain IV access  Sitter requirements:  no    Mental Status  No data found.      Calls to Physician Team  Calls for the past 14 hrs:   Reason for Communication Notification Details Provider Name Provider Role Method of Communication Response Time Attempted Contact With Provider Time Response Received From Provider   04/01/17 2230 Other (Comment) Provider Aware Belia Heman MD Resident Call See orders [phone]       Medications  [phone] - Medications Not Given  (last 12 hrs)         ** SITE UNKNOWN **       Medication Name Action Time Action Reason Comments     heparin (porcine) injection 5,000 Units 04/01/17 2153 Not Given Patient/family refused      heparin (porcine) injection 5,000 Units 04/02/17 0450 Not Given Patient/family refused                 Diet/Meals Consumed (past 24 hours)  Nutrition Assessment for the past 24 hrs:   Percent Meals Eaten (%)   04/01/17 1116 100 %   04/02/17 0014 100 %

## 2017-04-02 NOTE — Unmapped (Signed)
Department of Internal Medicine  Daily Progress Note      Chief Complaint / Reason for Follow-Up     Diane Stafford is a 31 y.o. female on hospital day 0. The principal reason for today's follow up visit is RUQ pain.    Interval History / Subjective     Patients IV blew last night and fortuitously did well with PO dilaudid rather than IV. She states it took longer to take effect but still relieved pain. Still nauseous, doesn't think she would be able to eat much without phenergan. Overall, ok with switching all PRN medications (dilaudid and phenergan) to orals. She still has pain in the RUQ around the drain. Had about 50 cc of thin yellow clear drainage this morning, in light of persistent output, we likely already know the answer to HIDA scan and can hold off on that. Is very apprehensive about the drain coming out and the possibility of forming another biloma which was extremely painful.     Review of Systems (Focused)     Review of Systems   Constitutional: Negative for chills and fever.   Respiratory: Negative for shortness of breath.    Cardiovascular: Negative for chest pain.   Gastrointestinal: Positive for abdominal pain.     Medications     Scheduled Meds:  ??? ciprofloxacin HCl  500 mg Oral 2 times per day   ??? gabapentin  100 mg Oral TID   ??? heparin (porcine)  5,000 Units Subcutaneous 3 times per day     Continuous Infusions:  PRN Meds:  HYDROmorphone, proMETHazine       Vital Signs     Temp:  [97.5 ??F (36.4 ??C)-98.7 ??F (37.1 ??C)] 97.8 ??F (36.6 ??C)  Heart Rate:  [71-87] 76  Resp:  [17-19] 18  BP: (114-130)/(64-88) 114/64    Intake/Output Summary (Last 24 hours) at 04/02/17 1314  Last data filed at 04/02/17 0014   Gross per 24 hour   Intake              720 ml   Output                0 ml   Net              720 ml         Physical Exam     Physical Exam   Constitutional: She is oriented to person, place, and time. She appears well-developed and well-nourished. No distress.   HENT:   Head: Atraumatic.   Eyes:  EOM are normal. Pupils are equal, round, and reactive to light.   Neck: Normal range of motion.   Cardiovascular: Normal rate, regular rhythm, normal heart sounds and intact distal pulses.    Pulmonary/Chest: Effort normal and breath sounds normal.   Abdominal: Soft. Bowel sounds are normal. There is tenderness (primarily in RUQ surrounding perc drain site. No surrounding erythema, warmth, or swelling. Less pain when distracted and stethoscope is pressed down.).   Now with minimal yellow drainage, no blood.   Musculoskeletal: She exhibits no edema.   Neurological: She is alert and oriented to person, place, and time.   Skin: Skin is warm and dry.   Psychiatric: She has a normal mood and affect. Her behavior is normal.   Nursing note and vitals reviewed.      Laboratory Data         Lab 04/02/17  1610 04/01/17  0501 03/31/17  0532 03/30/17  2118  WBC 4.7 4.8 5.4 5.3   HEMOGLOBIN 12.2 12.0 12.3 13.0   HEMATOCRIT 36.6 35.8 37.1 39.2   MEAN CORPUSCULAR VOLUME 84.4 84.1 84.6 84.6   PLATELETS 197 186 203 225           Lab 04/02/17  0428 04/01/17  0501 03/31/17  0532 03/30/17  2118   SODIUM 137 136 140 137   POTASSIUM 3.9 4.1 4.2 4.7   CHLORIDE 106 103 101 102   CO2 23 27 29 26    BUN 12 10 11 11    CREATININE 0.74 0.71 0.70 0.71   GLUCOSE 120* 96 97 99           Lab 04/02/17  0428 04/01/17  0501 03/31/17  0532 03/30/17  2118 03/30/17  0555   CALCIUM 9.0 9.5 9.4 9.2 9.3   MAGNESIUM 2.1  --   --  2.1 2.1   PHOSPHORUS 3.9  --  4.9* 4.7  --            Lab 04/02/17  0428 03/31/17  0532   INR 1.0 1.0   PROTHROMBIN TIME 13.4 13.2           Lab 04/02/17  0428 04/01/17  0501 03/31/17  0532 03/30/17  2118  03/29/17  1116   ALT 51 55* 65* 71*  < > 98*   AST 38 37 40* 55*  < > 92*   ALK PHOS 189* 172* 198* 198*  < > 216*   BILIRUBIN TOTAL 0.4 0.5 0.4 0.5  < > 0.6   BILIRUBIN DIRECT 0.12  --  0.1 0.1  --  0.1   ALBUMIN 3.3*   3.3* 3.4* 3.7   3.7 3.7  < > 3.9   < > = values in this interval not displayed.        Lab 03/31/17  0205    COLOR, URINE Yellow   CLARITY Clear   SPECIFIC GRAVITY, URINE 1.020   PH UA 7.5   PROTEIN UA Negative   GLUCOSE UA Negative   KETONES UA Negative   BILIRUBIN UA Negative   BLOOD UA Trace-intact*   NITRITE UA Negative   UROBILINOGEN UA 0.2 E.U./dL   LEUKOCYTES UA Negative   RBC UA 1   WBC UA 2       Diagnostic Studies     Awaiting CT read on 8/24    Assessment & Plan     Diane Stafford is a 31 y.o. female with h/o biloma with biliary leak status post laparoscopic cholcystectomy with hepatobiliary stent and external biliary drain presenting with increased abdominal pain.     Principal Problem:    RUQ pain  Active Problems:    Biloma following surgery      #Abdominal Pain  Lap chole on 7/9 that was complicated by a bile leak s/p ERCP and biliary stent placement on 7/11 with subsequent biloma requiring IR drainage on 7/15 at OSH. Came to UC on 7/28 with continued pain, bilious emesis and found to have persistent cystic duct remnants with persistent bile leakage which was treated with new metal stent on 7/29. This was then replaced by 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 She was recently discharged (8/23) due to this pain and then represented on 8/24 given her inability to fill her pain medication scripts (because of [facility]). She had new onset  serosanguinous drainage and may represent inflammation of existing stents. Now just with persistent pain and thin bilious drainage.  - CT scan showing no residual fluid collection where perc drain sits in subhepatic space  - 50 cc of thin, yellow clear liquid output past 24 hours  - Liver enzymes and alk phos are stable  - Switching to PO dilaudid 4 mg q4 and phenergan 12.5 mg PO q4   - Also added gapapentin 100 mg TID, can titrate up  - Continuing ciprofloxacin 500 mg BID for prophylaxis  - As she still has output from drain, she almost certainly has persistent  leak and HIDA scan likely to be positive, will not pursue  - We do believe IR drain study is of utility as the drain may require adjustment that could help with pain    FEN: PO as tolerated; K >4, Mg > 2    Dispo: inpatient while determine etiology for pain      Nutrition:  Diet Orders          Diet regular starting at 08/24 1537          Code Status: Full Code    Signed:  Daryel November, MD  04/02/2017, 1:14 PM

## 2017-04-02 NOTE — Unmapped (Signed)
Central monitoring remote orders reconciled.  ??  Zena Amos RN - [facility] Monitoring Unit

## 2017-04-03 ENCOUNTER — Observation Stay: Admit: 2017-04-03 | Payer: Medicaid (Managed Care) | Attending: Diagnostic Radiology

## 2017-04-03 LAB — HEPATIC FUNCTION PANEL
ALT: 57 U/L (ref 7–52)
AST: 47 U/L (ref 13–39)
Albumin: 3.7 g/dL (ref 3.5–5.7)
Alkaline Phosphatase: 187 U/L (ref 36–125)
Bilirubin, Direct: 0.11 mg/dL (ref 0.00–0.40)
Bilirubin, Indirect: 0.29 mg/dL (ref 0.00–1.10)
Total Bilirubin: 0.4 mg/dL (ref 0.0–1.5)
Total Protein: 6.6 g/dL (ref 6.4–8.9)

## 2017-04-03 LAB — RENAL FUNCTION PANEL W/EGFR
Albumin: 3.7 g/dL (ref 3.5–5.7)
Anion Gap: 7 mmol/L (ref 3–16)
BUN: 13 mg/dL (ref 7–25)
CO2: 24 mmol/L (ref 21–33)
Calcium: 9.3 mg/dL (ref 8.6–10.3)
Chloride: 105 mmol/L (ref 98–110)
Creatinine: 0.68 mg/dL (ref 0.60–1.30)
Glucose: 101 mg/dL (ref 70–100)
Osmolality, Calculated: 282 mOsm/kg (ref 278–305)
Phosphorus: 4.6 mg/dL (ref 2.1–4.7)
Potassium: 4 mmol/L (ref 3.5–5.3)
Sodium: 136 mmol/L (ref 133–146)
eGFR AA CKD-EPI: >90 See note.
eGFR NONAA CKD-EPI: >90 See note.

## 2017-04-03 LAB — PROTIME-INR
INR: 1 (ref 0.9–1.1)
Protime: 13.3 seconds (ref 11.8–14.8)

## 2017-04-03 LAB — CBC
Hematocrit: 38.5 % (ref 35.0–45.0)
Hemoglobin: 12.9 g/dL (ref 11.7–15.5)
MCH: 28.3 pg (ref 27.0–33.0)
MCHC: 33.5 g/dL (ref 32.0–36.0)
MCV: 84.3 fL (ref 80.0–100.0)
MPV: 8 fL (ref 7.5–11.5)
Platelets: 206 10*3/uL (ref 140–400)
RBC: 4.57 10*6/uL (ref 3.80–5.10)
RDW: 17 % (ref 11.0–15.0)
WBC: 4.6 10*3/uL (ref 3.8–10.8)

## 2017-04-03 LAB — MAGNESIUM: Magnesium: 2 mg/dL (ref 1.5–2.5)

## 2017-04-03 MED ORDER — acetaminophen (TYLENOL) tablet 650 mg
325 | Freq: Four times a day (QID) | ORAL | Status: AC | PRN
Start: 2017-04-03 — End: 2017-04-04
  Administered 2017-04-04 (×2): 650 mg via ORAL

## 2017-04-03 MED ORDER — gabapentin (NEURONTIN) capsule 300 mg
300 | Freq: Three times a day (TID) | ORAL | Status: AC
Start: 2017-04-03 — End: 2017-04-04
  Administered 2017-04-03 – 2017-04-04 (×4): 300 mg via ORAL

## 2017-04-03 MED ORDER — morphine SR (ORAMORPH SR) 12 hr tablet 30 mg
30 | Freq: Two times a day (BID) | ORAL | Status: AC
Start: 2017-04-03 — End: 2017-04-03
  Administered 2017-04-03: 19:00:00 30 mg via ORAL

## 2017-04-03 MED ORDER — lidocaine (LIDODERM) 5 % 1 patch
5 | TOPICAL | Status: AC
Start: 2017-04-03 — End: 2017-04-03

## 2017-04-03 MED ORDER — OMNIPAQUE 300 mg/mL (iohexol) 50 mL
300 | Freq: Once | INTRAVENOUS | Status: AC | PRN
Start: 2017-04-03 — End: 2017-04-03
  Administered 2017-04-03: 15:00:00 50 mL via BODY_CAVITY

## 2017-04-03 MED ORDER — morphine (MSIR) tablet 15 mg
15 | ORAL | Status: AC | PRN
Start: 2017-04-03 — End: 2017-04-04
  Administered 2017-04-03 – 2017-04-04 (×5): 15 mg via ORAL

## 2017-04-03 MED ORDER — tiZANidine (ZANAFLEX) tablet 4 mg
4 | Freq: Three times a day (TID) | ORAL | Status: AC
Start: 2017-04-03 — End: 2017-04-04
  Administered 2017-04-04 (×3): 4 mg via ORAL

## 2017-04-03 MED ORDER — ibuprofen (ADVIL,MOTRIN) tablet 400 mg
200 | Freq: Four times a day (QID) | ORAL | Status: AC | PRN
Start: 2017-04-03 — End: 2017-04-04
  Administered 2017-04-04 (×2): 400 mg via ORAL

## 2017-04-03 MED FILL — CIPROFLOXACIN 500 MG TABLET: 500 500 MG | ORAL | Qty: 1

## 2017-04-03 MED FILL — OMNIPAQUE 300 MG IODINE/ML INTRAVENOUS SOLUTION: 300 300 mg iodine/mL | INTRAVENOUS | Qty: 50

## 2017-04-03 MED FILL — HYDROMORPHONE 4 MG TABLET: 4 4 MG | ORAL | Qty: 1

## 2017-04-03 MED FILL — GABAPENTIN 100 MG CAPSULE: 100 100 MG | ORAL | Qty: 1

## 2017-04-03 MED FILL — TIZANIDINE 4 MG TABLET: 4 4 MG | ORAL | Qty: 1

## 2017-04-03 MED FILL — PROMETHAZINE 25 MG TABLET: 25 25 MG | ORAL | Qty: 1

## 2017-04-03 MED FILL — GABAPENTIN 300 MG CAPSULE: 300 300 MG | ORAL | Qty: 1

## 2017-04-03 MED FILL — HEPARIN (PORCINE) 5,000 UNIT/ML INJECTION SOLUTION: 5000 5,000 unit/mL | INTRAMUSCULAR | Qty: 1

## 2017-04-03 MED FILL — LIDODERM 5 % TOPICAL PATCH: 5 5 % | TOPICAL | Qty: 1

## 2017-04-03 MED FILL — MORPHINE 15 MG IMMEDIATE RELEASE TABLET: 15 15 MG | ORAL | Qty: 1

## 2017-04-03 MED FILL — MORPHINE ER 30 MG TABLET,EXTENDED RELEASE: 30 30 MG | ORAL | Qty: 1

## 2017-04-03 NOTE — Op Note (Signed)
INTERVENTIONAL RADIOLOGY  Post Procedure Note    Date: 04/03/2017  Patient: Diane Stafford  MRN: [id number]      INDICATION/DIAGNOSIS   Biloma    IR PROCEDURE  PERFORMED   Abscessogram    Operators:   Dr. Frutoso Schatz  Dr. 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    RECOMMENDATIONS/FOLLOW-UP   Continue percutaneous decompression of biloma to maximize drainage, monitor output      Please refer to full dictated Radiology report for details of findings/procedure.  Thank you for allowing our team to care for your patient.    Code Status: Full Code      Leanor Rubenstein, MD  Interventional Radiology Fellow  [phone]; pgr [phone]

## 2017-04-03 NOTE — Unmapped (Signed)
Care Coordinator/Social Worker Michaelle Copas participated in multidisciplinary rounding on pt this morning. Per medical team pt is not medically ready for discharge and will need further pain management and medication management prior to discharge. Pt is anticipated to be medically ready for discharge home with no identified SW discharge needs tomorrow 04/04/17.     SW will continue to follow this pt and to communicate with the medical team to assist with discharge planning and to address psychosocial needs as they arise.    [name], MSW, LSW  [phone]

## 2017-04-03 NOTE — Unmapped (Signed)
Attempted to gain IV access on patient 3 times total between two RN's in preparation for biliary drain procedure on 04/03/17. Unable to establish IV. SICU and MICU called for ultrasound guided IV. Alerted that there is no staff available at time to operate device.

## 2017-04-03 NOTE — Unmapped (Signed)
Nursing Overnight Progress Note    Significant Events During Shift  Patient was not able to establish IV access. Ultrasound guided IV staff unavailable overnight. Patient remained. NPO after midnight.    Patient/Family Concerns  Evening/Overnight Visitors: none  Concerns: none    Assessment  Nursing time demands: low  IV access: none, access not needed  Sitter requirements:  no    Mental Status  No data found.      Calls to Physician Team  No data found.      Medications  [phone] - Medications Not Given  (last 12 hrs)         ** SITE UNKNOWN **       Medication Name Action Time Action Reason Comments     heparin (porcine) injection 5,000 Units 04/02/17 2146 Not Given Patient/family refused patient reports itching when administered.     heparin (porcine) injection 5,000 Units 04/03/17 0450 Not Given Patient/family refused                 Diet/Meals Consumed (past 24 hours)  Nutrition Assessment for the past 24 hrs:   Diet Type Feeding Percent Meals Eaten (%) Appetite   04/02/17 1935 Regular Able to feed self 100 % Fair   04/02/17 2354 - - 100 % -   04/03/17 0600 Regular Able to feed self - -

## 2017-04-03 NOTE — Progress Notes (Signed)
Complications from a cholecystectomy 1.5 months ago, lots of RUQ pain  Presented here, had a biloma, leaking bile from the remnant cystic duct; ERCPs with multiple stents placed; subhepatic IR drain that is draining some extra bile out  Keeps getting admitted with severe pain. Just did cholangiogram today using site where drain is, still a leak at the cystic duct remnant. Sugery, IR all opting for conservative management, treat pain before they would go back in and clip   Got approved for long acting morphine  Has f/u with Diane Stafford  Lots of stents and one drain

## 2017-04-03 NOTE — Unmapped (Signed)
Department of Internal Medicine  Daily Progress Note      Chief Complaint / Reason for Follow-Up     Diane Stafford is a 31 y.o. female on hospital day 0. The principal reason for today's follow up visit is RUQ pain.    Interval History / Subjective     Interval Hx: still having pain which is less controlled on PO dilaudid--she says IV dilaudid or PO MS Contin are the only options that help, consulting pain management today. Drain study by [employer] today showed a cystic duct bile leak and this was communicated to [name].      Review of Systems (Focused)     Review of Systems   Constitutional: Negative for fever.   Respiratory: Negative for shortness of breath.    Cardiovascular: Negative for chest pain.   Gastrointestinal: Positive for abdominal pain.   Musculoskeletal: Negative for myalgias.         Medications     Scheduled Meds:  ??? ciprofloxacin HCl  500 mg Oral 2 times per day   ??? gabapentin  300 mg Oral TID   ??? heparin (porcine)  5,000 Units Subcutaneous 3 times per day   ??? lidocaine  1 patch Transdermal Q24H   ??? morphine SR  30 mg Oral 2 times per day     Continuous Infusions:  PRN Meds:  acetaminophen, ibuprofen, morphine, proMETHazine       Vital Signs     Temp:  [97.5 ??F (36.4 ??C)-98.7 ??F (37.1 ??C)] 98 ??F (36.7 ??C)  Heart Rate:  [72-113] 88  Resp:  [18] 18  BP: (108-122)/(53-84) 118/78    Intake/Output Summary (Last 24 hours) at 04/03/17 1618  Last data filed at 04/03/17 1451   Gross per 24 hour   Intake             1150 ml   Output             1690 ml   Net             -540 ml         Physical Exam     Physical Exam   Constitutional: She is oriented to person, place, and time. She appears well-developed and well-nourished. No distress.   HENT:   Head: Atraumatic.   Eyes: EOM are normal. Pupils are equal, round, and reactive to light.   Neck: Normal range of motion.   Cardiovascular: Normal rate and regular rhythm.    Pulmonary/Chest: Effort normal and breath sounds normal. She has no rales.   Abdominal:  Soft. Bowel sounds are normal. There is tenderness.   Musculoskeletal: She exhibits no edema.   Neurological: She is alert and oriented to person, place, and time. A cranial nerve deficit is present.   Skin: Skin is warm and dry.   Psychiatric: She has a normal mood and affect.   Nursing note and vitals reviewed.      Laboratory Data         Lab 04/03/17  0501 04/02/17  0428 04/01/17  0501 03/31/17  0532   WBC 4.6 4.7 4.8 5.4   HEMOGLOBIN 12.9 12.2 12.0 12.3   HEMATOCRIT 38.5 36.6 35.8 37.1   MEAN CORPUSCULAR VOLUME 84.3 84.4 84.1 84.6   PLATELETS 206 197 186 203           Lab 04/03/17  0501 04/02/17  0428 04/01/17  0501 03/31/17  0532   SODIUM 136 137 136 140   POTASSIUM  4.0 3.9 4.1 4.2   CHLORIDE 105 106 103 101   CO2 24 23 27 29    BUN 13 12 10 11    CREATININE 0.68 0.74 0.71 0.70   GLUCOSE 101* 120* 96 97           Lab 04/03/17  0501 04/02/17  0428 04/01/17  0501 03/31/17  0532 03/30/17  2118 03/30/17  0555   CALCIUM 9.3 9.0 9.5 9.4 9.2 9.3   MAGNESIUM 2.0 2.1  --   --  2.1 2.1   PHOSPHORUS 4.6 3.9  --  4.9* 4.7  --            Lab 04/03/17  0501 04/02/17  0428 03/31/17  0532   INR 1.0 1.0 1.0   PROTHROMBIN TIME 13.3 13.4 13.2           Lab 04/03/17  0501 04/02/17  0428 04/01/17  0501 03/31/17  0532 03/30/17  2118   ALT 57* 51 55* 65* 71*   AST 47* 38 37 40* 55*   ALK PHOS 187* 189* 172* 198* 198*   BILIRUBIN TOTAL 0.4 0.4 0.5 0.4 0.5   BILIRUBIN DIRECT 0.11 0.12  --  0.1 0.1   ALBUMIN 3.7   3.7 3.3*   3.3* 3.4* 3.7   3.7 3.7           Lab 03/31/17  0205   COLOR, URINE Yellow   CLARITY Clear   SPECIFIC GRAVITY, URINE 1.020   PH UA 7.5   PROTEIN UA Negative   GLUCOSE UA Negative   KETONES UA Negative   BILIRUBIN UA Negative   BLOOD UA Trace-intact*   NITRITE UA Negative   UROBILINOGEN UA 0.2 E.U./dL   LEUKOCYTES UA Negative   RBC UA 1   WBC UA 2             No results found for: NTPROBNP    No results found for: TSH, T3FREE, FREET4              Diagnostic Studies     INTERVENTIONAL RADIOLOGY  Post Procedure  Note  ??  Date: 04/03/2017  Patient: Diane Stafford  MRN: [id number]  ??  ??  INDICATION/DIAGNOSIS  Biloma  ??  IR PROCEDURE  PERFORMED  Abscessogram  ??  Operators:   Dr. Frutoso Schatz  Dr. Lattie Corns Rinzler  ??  PRE-OPERATIVE DIAGNOSIS  Biloma  ??  POST-OPERATIVE DIAGNOSIS  same  ??  ??  MEDICATIONS  None  ??  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  Specimens removed:  none  ??  Estimated Blood Loss:  Minimal (Less Than 15 mL)     Complications:  None apparent.  ??  RECOMMENDATIONS/FOLLOW-UP  Continue percutaneous decompression of biloma to maximize drainage, monitor output  ??  ??  Please refer to full dictated Radiology report for details of findings/procedure.  Thank you for allowing our team to care for your patient.  ??  Code Status: Full Code  ??  ??  Leanor Rubenstein, MD  Interventional Radiology Fellow  [phone]; pgr [phone]        Assessment & Plan     Diane Stafford is a 31 y.o. female on HD# 0 with RUQ pain.  The medical issues being addressed in today's encounter are as follows:    Principal Problem:    RUQ pain  Active Problems:  Biloma following surgery      #Abdominal Pain  Lap chole on 7/9 that was complicated by a bile leak s/p ERCP and biliary stent placement on 7/11 with subsequent biloma requiring IR drainage on 7/15 at OSH. Came to UC on 7/28 with continued pain, bilious emesis and found to have persistent cystic duct remnants with persistent bile leakage which was treated with new metal stent on 7/29. This was then replaced by 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 She was recently discharged (8/23) due to this pain and then represented on 8/24 given her inability to fill her pain medication scripts (because of [facility]). She had new onset serosanguinous drainage and may represent  inflammation of existing stents. Now just with persistent pain and thin bilious drainage.  -drain study by IR today showed a cystic duct bile leak with biloma; keeping percutaneous drain  -pt lost IV access for pain control so pain management was consulted and they recommended:   Tylenol, Ibuprofen, Gabapentin 300 TID, and recommended MSIR, xanaflex   Will likely need to escalate if patient continues to have pain, OK to double MSIR to 30 or add 15mg  for breakthrough pain.  -on cipro BID for cholangitis prophylaxis      Nutrition:  Diet Orders          Diet regular starting at 08/27 1148          Code Status: Full Code    Signed:  Reuel Derby, MD  04/03/2017, 4:18 PM

## 2017-04-03 NOTE — Unmapped (Signed)
[facility] PROGRESS NOTE  Admit Date: 03/31/2017  OR Date:       Subjective:   No acute events overnight.     Objective:   Vitals:  Temp:  [97.5 ??F (36.4 ??C)-98.7 ??F (37.1 ??C)] 97.8 ??F (36.6 ??C)  Heart Rate:  [72-113] 72  Resp:  [18] 18  BP: (108-122)/(53-84) 121/53  Vitals:    04/03/17 1011   BP: 121/53   Pulse: 72   Resp:    Temp:    SpO2:          Date 04/02/17 0700 - 04/03/17 0659 04/03/17 0700 - 04/04/17 0659   Shift 0700-1459 1500-2259 2300-0659 24 Hour Total 0700-1459 1500-2259 2300-0659 24 Hour Total   I  N  T  A  K  E   P.O.  240 750 990 60   60      P.O.  240 750 990 60   60    Shift Total  (mL/kg)  240  (2.7) 750  (8.4) 990  (11.1) 60  (0.7)   60  (0.7)   O  U  T  P  U  T   Urine  (mL/kg/hr)  1050  (1.5) 600  (0.8) 1650  (0.8)          Urine  1050 600 1650          Urine Occurrence  1 x 1 x 2 x        Drains  40  40          Output (mL) (Drain Bilary Abdomen Right)  40  40        Stool              Stool Occurrence  1 x  1 x        Shift Total  (mL/kg)  1090  (12.2) 600  (6.7) 1690  (19)       Weight (kg) 89.5 89.5 89.1 89.1 89.1 89.1 89.1 89.1       Physical Exam:  Gen: Alert and oriented x3, no acute distress  HEENT: NCAT, PERRL, neck supple  CV: Regular rate and rhythm, normal S1 and S2  Resp: CTAB, no respiratory distress  Abd: Soft, non-distended, tender at drain site, drain with scant bilious fluid   Ext: Warm and well perfused      Recent Labs      04/01/17   0501  04/02/17   0428  04/03/17   0501   WBC  4.8  4.7  4.6   HGB  12.0  12.2  12.9   HCT  35.8  36.6  38.5   MCV  84.1  84.4  84.3   PLT  186  197  206     Recent Labs      04/01/17   0501  04/02/17   0428  04/03/17   0501   NA  136  137  136   K  4.1  3.9  4.0   CL  103  106  105   CO2  27  23  24    PHOS   --   3.9  4.6   BUN  10  12  13    CREATININE  0.71  0.74  0.68   CALCIUM  9.5  9.0  9.3     No results for input(s): POCGLU, POCGMD in the last 72 hours.    Invalid input(s): GLU  Recent Labs  04/01/17   0501  04/02/17   0428   04/03/17   0501   BILITOT  0.5  0.4  0.4   AST  37  38  47*   ALT  55*  51  57*   ALKPHOS  172*  189*  187*     Recent Labs      04/02/17   0428  04/03/17   0501   INR  1.0  1.0   PROTIME  13.4  13.3       Current Medications:  Scheduled Medications:    ciprofloxacin HCl 500 mg 2 times per day   gabapentin 100 mg TID   heparin (porcine) 5,000 Units 3 times per day      IV Meds:      PRN Medications:    HYDROmorphone 4 mg Q3H PRN   proMETHazine 12.5 mg Q4H PRN       Imaging:  No results found.      Assessment / Plan:   Diane Stafford is a 31 y.o. female prior lap chole at [facility]. E's complicated by bile leak, ERCP and IR drainage of biloma.  Later diagnosed to have right hepatic duct injury after transfer to [facility] for persistent bilious drainage.  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     Transplant also has seen patient and plan is for Dr. Ronita Hipps to follow for possible surgical options if persistent leak after stent removal    IR managing drain, needs drain study and likely removal.     ACS will sign off as tx surgery is engaged. Please re-engage for any needs.    Diane Rowan, CNP  [facility], Dept of Surgery  Division of Trauma & Acute Care  Pager: [phone]

## 2017-04-03 NOTE — Unmapped (Signed)
Patient refused heparin. Risks and benefits of anticoagulant therapy explained to patient.     Patient reports that SQ heparin injections are painful and that she get's itchy

## 2017-04-03 NOTE — Unmapped (Signed)
Anesthesiology  Inpatient Pain Service  Consultation Note    04/03/2017    Patient Name: Diane Stafford 31 y.o. female   DOB: Jan 03, 1986  MRN: [id number]     Consulting Service: GI wards     Chief Complaint: Abdominal Pain      HPI: Diane Stafford is a 31 yo female who underwent a lap chole about a month prior who has ongoing complications from the procedure and abdominal pain. She is currently on only PO due to difficulty obtaining IV access with plans of being discharged tomorrow. She states her pain is 8/10 epigastric stabbing pain radiating to her right side.    Review of Systems:  Negative except as noted above in HPI    Allergies:  No Known Allergies       Physical Exam:  General appearance: alert, well appearing, and in no distress  Chest: Bilateral breath sounds and No tachypnea  CV: RRR, no added sounds and normal pedal pulses  Neuro/Muscular skeletal: Alert and oriented X 3, normal strength and tone. Normal symmetric reflexes. Normal coordination and gait   Abdominal: tender to palpation, bowel sounds present x4.      Labs:  Lab Results   Component Value Date    WBC 4.6 04/03/2017    HGB 12.9 04/03/2017    HCT 38.5 04/03/2017    MCV 84.3 04/03/2017    PLT 206 04/03/2017     Lab Results   Component Value Date    INR 1.0 04/03/2017    INR 1.0 04/02/2017    INR 1.0 03/31/2017    PROTIME 13.3 04/03/2017    PROTIME 13.4 04/02/2017    PROTIME 13.2 03/31/2017         Assessment/Plan:    Abdominal Pain  ?? Due to complications from cholecystectomy  ?? Recommend starting scheduled tylenol 975 mg TID  ?? Recommend NSAIDS such as ibuprofen 600 mg q6h if allowed by primary team  ?? Can continue gabapentin 300 mg TID for time being. Could increase to 600 mg TID  ?? Recommend zanaflex 4 mg TID  ?? Would recommend trying MSIR  ?? Discontinue Morphine ER  ?? Would recommend close follow up with primary care    Leida Lauth DO  [facility]-2  Anesthesiology Resident      Inpatient Pain Service  Pager - [phone](PAIN)

## 2017-04-03 NOTE — Unmapped (Signed)
Clinical Pharmacy Note - Transitions of Care    Prior Authorization for morphine ER 15 mg approved. Patient should still have paper RX written by Dr. Andi Hence last week. If opioid changed, a new PA will need to be submitted.    Please call with questions.    Fabian November, PharmD, BCPS  Clinical Pharmacy Specialist [facility]  Pager [phone]  Office: [phone]  Clinical Pharmacist On-Call Pager [phone]

## 2017-04-03 NOTE — Unmapped (Signed)
Problem: Acute Pain  Patient's pain progressing toward patient's stated pain goal   Goal: Patient verbalizes a reduction in pain level  Outcome: Progressing  Patient assessed and monitored for fluctuating levels of pain and improvement with use of interventions to manage pain and discomfort.

## 2017-04-03 NOTE — Unmapped (Signed)
Nursing Day Shift Progress Note    Significant Events During Shift  Patient came back from  IR after bilary drain checked and complaining of abdominal pain 8 to 9/10 ,medicated per prn Dilaudid  and ,team changed pain med's to Morphine 30 mg Q 12 hours and   PRN Morphine 15 mg Q 4 hours ,refused Lidocaine patch ,and patient request to talk to  team about pain med's GI team IS aware ,all needs met at this time CL WR,will continue to monitor.    Patient/Family Concerns  Visitors: none  Concerns: none    Assessment  Nursing time demands: moderate  IV access: unable to obtain IV access  Sitter requirements:  no    Mental Status  Mental Status for the past 14 hrs:   Level of Consciousness Orientation Level Cognition   04/03/17 0900 Alert Oriented X4 Appropriate judgement;Appropriate safety awareness;Appropriate attention/concentration;Appropriate for developmental age       Medications  [phone] - Medications Not Given  (last 12 hrs)         ** SITE UNKNOWN **       Medication Name Action Time Action Reason Comments     heparin (porcine) injection 5,000 Units 04/03/17 0450 Not Given Patient/family refused      heparin (porcine) injection 5,000 Units 04/03/17 1221 Not Given Patient/family refused      lidocaine (LIDODERM) 5 % 1 patch 04/03/17 1511 Not Given Patient/family refused

## 2017-04-04 LAB — COMPREHENSIVE METABOLIC PANEL
ALT: 74 U/L (ref 7–52)
AST: 62 U/L (ref 13–39)
Albumin: 4.1 g/dL (ref 3.5–5.7)
Alkaline Phosphatase: 234 U/L (ref 36–125)
Anion Gap: 9 mmol/L (ref 3–16)
BUN: 12 mg/dL (ref 7–25)
CO2: 26 mmol/L (ref 21–33)
Calcium: 9.9 mg/dL (ref 8.6–10.3)
Chloride: 102 mmol/L (ref 98–110)
Creatinine: 0.73 mg/dL (ref 0.60–1.30)
Glucose: 87 mg/dL (ref 70–100)
Osmolality, Calculated: 283 mOsm/kg (ref 278–305)
Potassium: 3.8 mmol/L (ref 3.5–5.3)
Sodium: 137 mmol/L (ref 133–146)
Total Bilirubin: 0.6 mg/dL (ref 0.0–1.5)
Total Protein: 7.7 g/dL (ref 6.4–8.9)
eGFR AA CKD-EPI: >90 See note.
eGFR NONAA CKD-EPI: >90 See note.

## 2017-04-04 LAB — CBC
Hematocrit: 39.5 % (ref 35.0–45.0)
Hemoglobin: 12.8 g/dL (ref 11.7–15.5)
MCH: 27.6 pg (ref 27.0–33.0)
MCHC: 32.5 g/dL (ref 32.0–36.0)
MCV: 84.9 fL (ref 80.0–100.0)
MPV: 8.1 fL (ref 7.5–11.5)
Platelets: 217 10*3/uL (ref 140–400)
RBC: 4.65 10*6/uL (ref 3.80–5.10)
RDW: 16.4 % (ref 11.0–15.0)
WBC: 4.8 10*3/uL (ref 3.8–10.8)

## 2017-04-04 LAB — PROTIME-INR
INR: 1 (ref 0.9–1.1)
Protime: 13 seconds (ref 11.8–14.8)

## 2017-04-04 LAB — MAGNESIUM: Magnesium: 2 mg/dL (ref 1.5–2.5)

## 2017-04-04 MED ORDER — proMETHazine (PHENERGAN) 12.5 MG tablet
12.5 | ORAL_TABLET | ORAL | 0 refills | Status: AC | PRN
Start: 2017-04-04 — End: ?

## 2017-04-04 MED ORDER — morphine SR (MS CONTIN, ORAMORPH SR) 12 hr tablet 15 mg
15 | Freq: Two times a day (BID) | ORAL | Status: AC
Start: 2017-04-04 — End: 2017-04-04
  Administered 2017-04-04: 13:00:00 15 mg via ORAL

## 2017-04-04 MED ORDER — tiZANidine (ZANAFLEX) 4 MG tablet
4 | ORAL_TABLET | Freq: Three times a day (TID) | ORAL | 0 refills | Status: AC
Start: 2017-04-04 — End: 2017-04-11

## 2017-04-04 MED ORDER — gabapentin (NEURONTIN) 300 MG capsule
300 | ORAL_CAPSULE | Freq: Three times a day (TID) | ORAL | 0 refills | Status: AC
Start: 2017-04-04 — End: 2017-05-04

## 2017-04-04 MED ORDER — morphine (MSIR) 15 MG tablet
15 | ORAL_TABLET | ORAL | 0 refills | Status: AC | PRN
Start: 2017-04-04 — End: 2017-04-11

## 2017-04-04 MED ORDER — morphine SR (MS CONTIN, ORAMORPH SR) 15 MG 12 hr tablet
15 | ORAL_TABLET | Freq: Two times a day (BID) | ORAL | 0 refills | Status: AC
Start: 2017-04-04 — End: 2017-04-11

## 2017-04-04 MED FILL — GABAPENTIN 300 MG CAPSULE: 300 300 MG | ORAL | Qty: 1

## 2017-04-04 MED FILL — IBUPROFEN 200 MG TABLET: 200 200 MG | ORAL | Qty: 2

## 2017-04-04 MED FILL — MORPHINE 15 MG IMMEDIATE RELEASE TABLET: 15 15 MG | ORAL | Qty: 1

## 2017-04-04 MED FILL — HEPARIN (PORCINE) 5,000 UNIT/ML INJECTION SOLUTION: 5000 5,000 unit/mL | INTRAMUSCULAR | Qty: 1

## 2017-04-04 MED FILL — TYLENOL 325 MG TABLET: 325 325 mg | ORAL | Qty: 2

## 2017-04-04 MED FILL — MORPHINE ER 15 MG TABLET,EXTENDED RELEASE: 15 15 MG | ORAL | Qty: 1

## 2017-04-04 MED FILL — CIPROFLOXACIN 500 MG TABLET: 500 500 MG | ORAL | Qty: 1

## 2017-04-04 NOTE — Unmapped (Signed)
Pt discharged to home.  IV removed.  All discharge instructions discussed with patient and future appointments.  Pt gave verbal understanding of these instructions and appointments.  All questions answered.  Belongings collected by patient.    Diane Stafford

## 2017-04-04 NOTE — Unmapped (Signed)
ACS service was following this patient peripherally s/p laparoscopic cholecystectomy at [facility]. 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 Follow up appointment was originally to be scheduled with [name] but upon further discussion between consulting services patient will be followed by Dr. Ronita Hipps as an outpatient. Appointment was set up with his scheduler for first available on 9/26 @ 11:00 AM in [facility] office. Discharge navigator was updated with follow up appointment details.     Spoke with patient's RN, Thayer Ohm, who states she is waiting for transportation to be discharged but he will reprint AVS so patient can have updated follow up appointment information. No further questions or needs at this time.    Margaretmary Lombard, RN  Acute Care Surgery Nurse Clinician  [phone]  Pager:[phone]

## 2017-04-04 NOTE — Unmapped (Signed)
Nursing Overnight Progress Note    Significant Events During Shift  Pt cont to report abdominal pain 8/9. Medicated appropriately per MAR. No other concerns voiced at this time. No s/s of distress. CLWR, will continue to monitor.     Patient/Family Concerns  Evening/Overnight Visitors: none  Concerns: none    Assessment  Nursing time demands: moderate  IV access: unable to obtain IV access  Sitter requirements:  no    Mental Status  Mental Status for the past 14 hrs:   Level of Consciousness Orientation Level Cognition   04/03/17 2024 Alert Oriented X4 Ability to abstract;Appropriate judgement;Appropriate safety awareness;Appropriate attention/concentration;Appropriate for developmental age;Follows simple commands;Follows 2-step commands       Calls to Physician Team  No data found.      Medications  [phone] - Medications Not Given  (last 12 hrs)         ** SITE UNKNOWN **       Medication Name Action Time Action Reason Comments     heparin (porcine) injection 5,000 Units 04/03/17 2024 Not Given Patient/family refused      heparin (porcine) injection 5,000 Units 04/04/17 0525 Not Given Patient/family refused                 Diet/Meals Consumed (past 24 hours)  Nutrition Assessment for the past 24 hrs:   Diet Type Feeding Percent Meals Eaten (%)   04/03/17 0900 NPO Able to feed self -   04/03/17 1451 - - 240 %   04/03/17 2000 - - 100 %

## 2017-04-04 NOTE — Unmapped (Addendum)
You were here because of severe pain related to your prior surgery. We changed your pain medicine to include both a long acting medicine and a short acting medicine. Take these medications and your other medications as directed.  1) Follow up with Dr. Ronita Hipps at your scheduled visit.  2) Continue to take care of your drain  3) If your symptoms worsen please seek emergency medical assistance        Abdominal Pain, Adult  Many things can cause belly (abdominal) pain. Most times, belly pain is not dangerous. Many cases of belly pain can be watched and treated at home. Sometimes belly pain is serious, though. Your doctor will try to find the cause of your belly pain.  Follow these instructions at home:  ?? Take over-the-counter and prescription medicines only as told by your doctor. Do not take medicines that help you poop (laxatives) unless told to by your doctor.  ?? Drink enough fluid to keep your pee (urine) clear or pale yellow.  ?? Watch your belly pain for any changes.  ?? Keep all follow-up visits as told by your doctor. This is important.  Contact a doctor if:  ?? Your belly pain changes or gets worse.  ?? You are not hungry, or you lose weight without trying.  ?? You are having trouble pooping (constipated) or have watery poop (diarrhea) for more than 2-3 days.  ?? You have pain when you pee or poop.  ?? Your belly pain wakes you up at night.  ?? Your pain gets worse with meals, after eating, or with certain foods.  ?? You are throwing up and cannot keep anything down.  ?? You have a fever.  Get help right away if:  ?? Your pain does not go away as soon as your doctor says it should.  ?? You cannot stop throwing up.  ?? Your pain is only in areas of your belly, such as the right side or the left lower part of the belly.  ?? You have bloody or black poop, or poop that looks like tar.  ?? You have very bad pain, cramping, or bloating in your belly.  ?? You have signs of not having enough fluid or water in your body (dehydration),  such as:  ?? Dark pee, very little pee, or no pee.  ?? Cracked lips.  ?? Dry mouth.  ?? Sunken eyes.  ?? Sleepiness.  ?? Weakness.  This information is not intended to replace advice given to you by your health care provider. Make sure you discuss any questions you have with your health care provider.  Document Released: 01/11/2008 Document Revised: 02/12/2016 Document Reviewed: 01/06/2016  Elsevier Interactive Patient Education ?? 2017 [employer].

## 2017-04-04 NOTE — Unmapped (Signed)
Care Coordinator/Social Worker Michaelle Copas participated in multidisciplinary rounding on pt this morning. Per medical team pt is medically ready for discharge home tonight. Per medical team, pt will need to have her general surgery appointment rescheduled for within one week of discharge. DCPA D. Bretcher actively working to schedule appointment. Pt has no further identified SW discharge needs.     SW will continue to follow this pt and to communicate with the medical team to assist with discharge planning and to address psychosocial needs as they arise.    [name], MSW, LSW  [phone]

## 2017-04-04 NOTE — Unmapped (Signed)
[facility] PROGRESS NOTE  Admit Date: 03/31/2017      Subjective:   IR drain study- suggested cystic duct stump leak.   C/o minimal pain    Objective:   Vitals:  Temp:  [97.2 ??F (36.2 ??C)-98.1 ??F (36.7 ??C)] 97.2 ??F (36.2 ??C)  Heart Rate:  [78-109] 79  Resp:  [18] 18  BP: (98-117)/(59-84) 101/66  Vitals:    04/04/17 1203   BP: 101/66   Pulse: 79   Resp: 18   Temp: 97.2 ??F (36.2 ??C)   SpO2: 100%         Date 04/03/17 0700 - 04/04/17 0659 04/04/17 0700 - 04/05/17 0659   Shift 0700-1459 1500-2259 2300-0659 24 Hour Total 0700-1459 1500-2259 2300-0659 24 Hour Total   I  N  T  A  K  E   P.O. 160 120  280 670   670      P.O. 160 120  280 670   670    Shift Total  (mL/kg) 160  (1.8) 120  (1.3)  280  (3.1) 670  (7.4)   670  (7.4)   O  U  T  P  U  T   Urine  (mL/kg/hr)  500  (0.7) 900  (1.2) 1400  (0.6) 0   0      Urine  657 883 0158 0   0      Urine Occurrence 2 x 2 x 1 x 5 x        Emesis/NG output     0   0      Emesis     0   0    Drains   50 50          Output (mL) (Drain Bilary Abdomen Right)   50 50        Stool              Stool Occurrence 1 x   1 x        Shift Total  (mL/kg)  500  (5.6) 950  (10.5) 1450  (16) 0  (0)   0  (0)   Weight (kg) 89.1 89.1 90.6 90.6 90.6 90.6 90.6 90.6       Physical Exam:  Gen: Alert and oriented x3, no acute distress  HEENT: NCAT, PERRL, neck supple  CV: Regular rate and rhythm, normal S1 and S2  Resp: CTAB, no respiratory distress  Abd: Soft, non-distended, tender at drain site, drain with scant bilious fluid   Ext: Warm and well perfused      Recent Labs      04/02/17   0428  04/03/17   0501  04/04/17   0611   WBC  4.7  4.6  4.8   HGB  12.2  12.9  12.8   HCT  36.6  38.5  39.5   MCV  84.4  84.3  84.9   PLT  197  206  217     Recent Labs      04/02/17   0428  04/03/17   0501  04/04/17   0611   NA  137  136  137   K  3.9  4.0  3.8   CL  106  105  102   CO2  23  24  26    PHOS  3.9  4.6   --    BUN  12  13  12    CREATININE  0.74  0.68  0.73   CALCIUM  9.0  9.3  9.9     No results  for input(s): POCGLU, POCGMD in the last 72 hours.    Invalid input(s): GLU  Recent Labs      04/02/17   0428  04/03/17   0501  04/04/17   0611   BILITOT  0.4  0.4  0.6   AST  38  47*  62*   ALT  51  57*  74*   ALKPHOS  189*  187*  234*     Recent Labs      04/02/17   0428  04/03/17   0501  04/04/17   0611   INR  1.0  1.0  1.0   PROTIME  13.4  13.3  13.0       Current Medications:  Scheduled Medications:    ciprofloxacin HCl 500 mg 2 times per day   gabapentin 300 mg TID   heparin (porcine) 5,000 Units 3 times per day   morphine SR 15 mg 2 times per day   tiZANidine 4 mg TID      IV Meds:      PRN Medications:    acetaminophen 650 mg Q6H PRN   ibuprofen 400 mg Q6H PRN   morphine 15 mg Q4H PRN   proMETHazine 12.5 mg Q4H PRN       Imaging:  No results found.      Assessment / Plan:   Diane Stafford is a 31 y.o. female prior lap chole at [facility]. E's complicated by bile leak, ERCP and IR drainage of biloma.  Later diagnosed to have right hepatic duct injury after transfer to [facility] for persistent bilious drainage. IR drain study yesterday suggests leak from cystic duct stump. Regardless, there is no residual fluid collection, drain position is great, her drain output is low, and she is overall doing well. Per discussions with Dr Ronita Hipps and transplant surgery team there is no intervention necessary at this time. Patient is stable for discharge home and may make follow up appointment with Dr Ronita Hipps.    [name], MD  General Surgery Resident  [phone]    ACS Attending Attestation  I have not seen this patient today, but I did discuss their care with the ACS team. I concur with the resident's findings, interpretation of data, and management plan as modified by me above.     Bile duct injury after lap chole at OSH - continues to have persistent leak, which seems to be sealing  -Discussed with txp surgery given long-term risks with stricture. ??Will need f/u outpatient with txp. Continue to monitor drain output for now    Sande Brothers, MD, PhD  Division of Trauma, Surgical Critical Care, and Acute Care Surgery  [facility]  Academic Office [phone]  Pager: [phone]

## 2017-04-04 NOTE — Unmapped (Signed)
[facility]  Department of Internal Medicine  Inpatient Discharge Summary    Patient: Diane Stafford   MRN: [id number]   CSN: [id number]    Date of Admission: 03/31/2017  Date of Discharge: 04/04/2017  Attending Physician: Langley Adie, MD;Richard*       Diagnoses Present on Admission     Past Medical History:   Diagnosis Date   ??? Menarche     age 31          Discharge Diagnoses     Active Hospital Problems    Diagnosis Date Noted   ??? RUQ pain [R10.11] 03/31/2017   ??? Biloma following surgery [T81.89XA, K66.8] 03/31/2017      Resolved Hospital Problems    Diagnosis Date Noted Date Resolved   No resolved problems to display.         Operations/Procedures Performed (include dates)     Surgeries:  n/a      Lines/Drains/Airways:  Patient Lines/Drains/Airways Status    Active Line / PIV Line     Name:   Placement date:   Placement time:   Site:   Days:    Peripheral IV 03/29/17 Left Antecubital  03/29/17    1124    Antecubital    6                  Notable Imaging Studies:  CT Abdomen With IV contrast [[id number]] Collected: 03/31/17 1151   Order Status: Completed Updated: 03/31/17 1231   Narrative: ??   ??Exam: CT of the abdomen and pelvis with contrast    INDICATION:History of cholecystitis, percutaneous drainage placement, reevaluate drains    COMPARISON:    TECHNIQUE: Following the administration of intravenous contrast helically acquired axial multidetector CT images were obtained from the lung bases through the pelvis. Dose reduction techniques were employed.    FINDINGS:    The lung bases are clear.    2 separate biliary stents are in similar position previous exam.    Percutaneous drainage catheter in the subhepatic space is unchanged in position. There is no significant residual fluid collection.    The remaining visualized solid and hollow viscera are unremarkable.   Impression: ??   IMPRESSION: No significant interval change when compared to the prior exam of 03/29/2017.    Report Verified  by: Leda Roys at 03/31/2017 12:29 PM EDT         Other Procedures:  1. 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 Surgery - for evaluation and pain  2. IR - for drain study  3. Transplant Surgery - for evaluation and pain      Allergies     No known drug allergies    Discharge Medications        Medication List      TAKE these medications, which are NEW      Quantity/Refills   gabapentin 300 MG capsule  Commonly known as:  NEURONTIN  Take 1 capsule (300 mg total) by mouth 3 times a day for 30 days.   Quantity:  90 capsule  Refills:  0     * morphine 15 MG tablet  Commonly known as:  MSIR  Take 1 tablet (15 mg total) by mouth every 4 hours as needed for  up to 7 days.  Earliest Fill Date: 04/04/17   Quantity:  60 tablet  Refills:  0     * morphine SR 15 MG 12 hr tablet  Commonly known as:  MS CONTIN, ORAMORPH SR  Take 1 tablet (15 mg total) by mouth every 12 hours for 7 days.  Earliest Fill Date: 04/04/17   Quantity:  14 tablet  Refills:  0     proMETHazine 12.5 MG tablet  Commonly known as:  PHENERGAN  Take 1 tablet (12.5 mg total) by mouth every 4 hours as needed.   Quantity:  30 tablet  Refills:  0     tiZANidine 4 MG tablet  Commonly known as:  ZANAFLEX  Take 1 tablet (4 mg total) by mouth 3 times a day for 7 days.   Quantity:  21 tablet  Refills:  0        * This list has 2 medication(s) that are the same as other medications prescribed for you. Read the directions carefully, and ask your doctor or other care provider to review them with you.            TAKE these medications, which you were ALREADY TAKING      Quantity/Refills   acetaminophen 325 MG tablet  Commonly known as:  TYLENOL  Take 3 tablets (975 mg total) by mouth every 8 hours as needed for Pain.   Quantity:  100 tablet  Refills:  0         STOP taking these medications    ciprofloxacin HCl 500 MG tablet  Commonly known as:  CIPRO           Where to Get Your Medications      You can get these medications from any pharmacy    Bring a paper prescription for each of these medications  ?? gabapentin 300 MG capsule  ?? morphine 15 MG tablet  ?? morphine SR 15 MG 12 hr tablet  ?? proMETHazine 12.5 MG tablet  ?? tiZANidine 4 MG tablet           Reason for Admission     Diane Stafford is a 31 y.o. female with PMH of recent cholecystectomy complicated by bile leak and stent/percutaneous drain placement who presented with abdominal pain.      Hospital Course By Problem     #Abdominal Pain  Lap chole on 7/9 that was complicated by a bile leak s/p ERCP and biliary stent placement on 7/11 with subsequent biloma requiring IR drainage on 7/15 at OSH. Came to [facility] on 7/28 with continued pain, bilious emesis and found to have persistent cystic duct remnants with persistent bile leakage which was treated with new metal stent on 7/29. This was then replaced by 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 She was recently discharged (8/23) due to this pain and then represented on 8/24 given her inability to fill her pain medication scripts (because of [facility]). She had new onset serosanguinous drainage and may represent inflammation of existing stents. Now just with persistent pain and thin bilious drainage. A drain study on 04/03/2017 showed a cystic duct leak and [name] (surgeon) is aware and will follow up with the patient in clinic but for now does not recommend intervening. Pt stabilized on a pain regimen of MS-Contin 15 BID and morphine 15mg  every 4 hours as  needed for pain along with 300mg  gabapentin TID, muscle relaxants, tylenol, and ibuprofen.       Condition on Discharge     1. Functional Status: Normal    2. Mental Status: Normal    3. Diet / Tube Feeding  / TPN:  Diet Orders          Diet regular starting at 08/27 1148        As listed above    4. Respiratory / Lines & Tubes / Wounds:  Pt has percutaneous biliary drain present prior to admission    5. Discharge Physical Exam:  BP 101/66 (BP Location: Left arm, Patient Position: Sitting)    Pulse 79    Temp 97.2 ??F (36.2 ??C) (Oral)    Resp 18    Ht 5' 1 (1.549 m)    Wt 199 lb 11.2 oz (90.6 kg)    SpO2 100%    BMI 37.73 kg/m??      Physical Exam   Constitutional: She is oriented to person, place, and time. She appears well-nourished. No distress.   HENT:   Head: Atraumatic.   Eyes: EOM are normal. Pupils are equal, round, and reactive to light.   Neck: Normal range of motion.   Cardiovascular: Normal rate, regular rhythm and normal heart sounds.    Pulmonary/Chest: Effort normal and breath sounds normal. No respiratory distress.   Abdominal: Soft. Bowel sounds are normal. There is tenderness.   Musculoskeletal: She exhibits no edema.   Neurological: She is alert and oriented to person, place, and time. No cranial nerve deficit.   Skin: Skin is warm and dry.   Psychiatric: She has a normal mood and affect. Her behavior is normal.   Nursing note and vitals reviewed.      Core Measure Documentation (As Applicable)     Most Recent Wt: Weight: 199 lb 11.2 oz (90.6 kg)      Disposition     Home independent      Follow-Up Appointments       Purnell Shoemaker, MD  [address]  [address]  [place] [address]  [phone]    On 05/03/2017  @ 11:00 AM for stricture hepatic duct follow up, [facility] 7th floor      Patient Instructions / Follow-Up Items for Receiving Physician     You were here because of severe pain related to your prior surgery. We changed your pain medicine to include both a long acting medicine and a short acting medicine. Take these medications and your other medications as directed.  1) Follow up with [name] at your scheduled visit.  2) Continue to take care of your drain  3) If your  symptoms worsen please seek emergency medical assistance      Signed:    Reuel Derby, MD   04/04/2017, 4:04 PM

## 2017-04-05 ENCOUNTER — Encounter: Payer: Medicaid (Managed Care) | Attending: Gastroenterology

## 2017-04-17 ENCOUNTER — Telehealth: Payer: Self-pay | Admitting: Family Medicine

## 2017-04-18 NOTE — Unmapped (Signed)
IR PRE-PROCEDURE PHONE CALL NOTE   Date: 04/18/2017  MRN: [id number]  Patient: Diane Stafford  Age: 31 y.o.    Not able to get ahold of [place], patient during outgoing , in preparation for upcoming IR Abscessogram procedure at 830am on 04/19/17 with an arrival time of 8am. Prep Instructions to hold N/A as approved by N/A, NPO after 12am, and need for a driver reviewed. Emilio Aspen verbalized understanding and teach back of instructions.    Interperter needed: No    Wt Readings from Last 1 Encounters:   04/04/17 199 lb 11.2 oz (90.6 kg)      Ht Readings from Last 1 Encounters:   03/31/17 5' 1 (1.549 m)       Allergies Verified? Yes  No Known Allergies    History:     Primary Care Physician: Santiago Glad (Inactive)    Cardiovascular Disease: None Cardiologist's name: N/A    Pulmonary Disease: None Pulmonologist's name:N/A    Renal Disease: No     Liver Disease: No; laproscopic Cholecystectomy done 02/13/17 at [facility] with postop complications: biliary fistula & leaking from cystic duct stump.    Heartburn: Yes    Fainting: No    Stroke: No    Seizure: No    Diabetes: No    Bleeding Problems: No    Blood Clots: No     Smoker: Yes}; If Yes, how many packs per day? 1; How many years of smoking? 10    >3 Drinks per day: Yes; If No, how many drinks per week? 7     Pregnant: No    Can you lay flat: Yes    Have you or a family member ever had problems with anesthesia/sedation: No    Past Medical History:   Diagnosis Date   ??? Menarche     age 37     Past Surgical History:   Procedure Laterality Date   ??? CESAREAN SECTION      x 4   ??? CHOLECYSTECTOMY     ??? ERCP     ??? ERCP N/A 03/05/2017    Procedure: ERCP;  Surgeon: Floreen Comber, MD;  Location: [facility];  Service: Gastroenterology;  Laterality: N/A;   ??? ERCP N/A 03/23/2017    Procedure: ERCP w/ stent exhange;  Surgeon: Floreen Comber, MD;  Location: [facility];  Service: Gastroenterology;  Laterality: N/A;   ??? TUBAL LIGATION     ??? WISDOM TOOTH EXTRACTION  2010     two       Medications:  Current Outpatient Prescriptions on File Prior to Encounter   Medication Sig Dispense Refill   ??? acetaminophen (TYLENOL) 325 MG tablet Take 3 tablets (975 mg total) by mouth every 8 hours as needed for Pain. 100 tablet 0   ??? gabapentin (NEURONTIN) 300 MG capsule Take 1 capsule (300 mg total) by mouth 3 times a day for 30 days. 90 capsule 0   ??? proMETHazine (PHENERGAN) 12.5 MG tablet Take 1 tablet (12.5 mg total) by mouth every 4 hours as needed. 30 tablet 0     No current facility-administered medications on file prior to encounter.        Anticoagulation/Anti-platelet:N/A; Last dose given on N/A    Planned Sedation: Moderate      Hydration needed: N/A     Possible Pre-meds: N/A     Possible labs needed: N/A     Other Imaging needed: No    Transportation needed: Yes  Who will give you a ride and take care of you after discharge?: Family member or friend.      Azai Gaffin [facility]  Interventional Radiology [phone]   04/18/2017, 10:43 AM

## 2017-04-19 MED ORDER — cefTRIAXone (ROCEPHIN) injection 1 g
500 | INTRAMUSCULAR | Status: AC | PRN
Start: 2017-04-19 — End: 2017-04-19

## 2017-04-19 MED ORDER — cefTRIAXone (ROCEPHIN) 1 g in sodium chloride 0.9% 100 mL ADDaptor IVPB
INTRAVENOUS | Status: AC | PRN
Start: 2017-04-19 — End: 2017-04-23

## 2017-04-19 MED FILL — CEFTRIAXONE 1 GRAM SOLUTION FOR INJECTION: 1 1 gram | INTRAMUSCULAR | Qty: 1

## 2017-04-20 ENCOUNTER — Inpatient Hospital Stay: Payer: Medicaid (Managed Care) | Attending: Acute Care

## 2017-04-21 ENCOUNTER — Ambulatory Visit: Admitting: Family Medicine

## 2017-04-27 ENCOUNTER — Inpatient Hospital Stay: Admit: 2017-04-27

## 2017-05-01 NOTE — Unmapped (Signed)
Patient called to schedule ERCP for stent removal. Patient states she has not had any drainage from the percutaneous drain for a couple of weeks and is currently working with Dr. Beverly Sessions office to schedule an appointment to have it removed. Your note from ERCP done on 04/20/17 reads:   Monitor output from the percutaneous drain.                       - Repeat ERCP with stent removal/ exchange in 8 to 10 weeks if percutaneous drain ouput decreases. Otherwise, consider other management options. Note: The findings were discussed with [name] by telephone following the procedure.  Staff message sent to [name] for direction.

## 2017-05-01 NOTE — Telephone Encounter (Signed)
Patient is requesting to    [   ] Speak to the Nurse :    Felina.Flakes   ] Schedule a procedure: ERCP stent removal. Stents were placed on 04/20/17.  Pt can be reached at [phone]    [   ] Status of paper work:    [   ] Labs to be entered into [place]    [   ] Request sooner appointment with Dr.     [   ] Medication question    [   ] Patient is returning call     [   ] Inform the Provider:    [   ] Schedule a New Patient Appointment with the following Provider:     [   ] Other:

## 2017-05-04 NOTE — Unmapped (Signed)
Spoke w/ patient and scheduled for ERCP on 06/23/17. Instructed patient to be NPO after midnight with the exception of blood pressure medication. Instructed to arrive at [facility] at 7:00 am with escort. Directions given, verbalized understanding. Written instructions and directions mailed as well.

## 2017-05-05 ENCOUNTER — Inpatient Hospital Stay: Admit: 2017-05-05 | Payer: Medicaid (Managed Care) | Attending: Diagnostic Radiology

## 2017-05-05 DIAGNOSIS — Z4682 Encounter for fitting and adjustment of non-vascular catheter: Secondary | ICD-10-CM

## 2017-05-05 MED ORDER — fentaNYL (SUBLIMAZE) injection
50 | INTRAMUSCULAR | Status: AC | PRN
Start: 2017-05-05 — End: 2017-05-05
  Administered 2017-05-05 (×2): 50 via INTRAVENOUS

## 2017-05-05 MED ORDER — fentaNYL (SUBLIMAZE) 50 mcg/mL injection
50 | INTRAMUSCULAR | Status: AC
Start: 2017-05-05 — End: 2017-05-06

## 2017-05-05 MED ORDER — OMNIPAQUE 300 mg/mL (iohexol) 50 mL
300 | Freq: Once | INTRAVENOUS | Status: AC | PRN
Start: 2017-05-05 — End: 2017-05-05
  Administered 2017-05-05: 20:00:00 50 mL via BODY_CAVITY

## 2017-05-05 MED FILL — OMNIPAQUE 300 MG IODINE/ML INTRAVENOUS SOLUTION: 300 300 mg iodine/mL | INTRAVENOUS | Qty: 50

## 2017-05-05 MED FILL — FENTANYL (PF) 50 MCG/ML INJECTION SOLUTION: 50 50 mcg/mL | INTRAMUSCULAR | Qty: 4

## 2017-05-05 NOTE — Unmapped (Signed)
Percutaneous Abscess Drain, Care After  This sheet gives you information about how to care for yourself after your procedure. Your health care provider may also give you more specific instructions. If you have problems or questions, contact your health care provider.  What can I expect after the procedure?  After your procedure, it is common to have:  ?? A small amount of bruising and discomfort in the area where the drainage tube (catheter) was placed.  ?? Sleepiness and fatigue. This should go away after the medicines you were given have worn off.  Follow these instructions at home:  Incision care  ?? Follow instructions from your health care provider about how to take care of your incision. Make sure you:  ?? Wash your hands with soap and water before you change your bandage (dressing). If soap and water are not available, use hand sanitizer.  ?? Change your dressing as told by your health care provider.  ?? Leave stitches (sutures), skin glue, or adhesive strips in place. These skin closures may need to stay in place for 2 weeks or longer. If adhesive strip edges start to loosen and curl up, you may trim the loose edges. Do not remove adhesive strips completely unless your health care provider tells you to do that.  ?? Check your incision area every day for signs of infection. Check for:  ?? More redness, swelling, or pain.  ?? More fluid or blood.  ?? Warmth.  ?? Pus or a bad smell.  ?? Fluid leaking from around your catheter (instead of fluid draining through your catheter).  Catheter care  ?? Follow instructions from your health care provider about emptying and cleaning your catheter and collection bag. You may need to clean the catheter every day so it does not clog.  ?? If directed, write down the following information every time you empty your bag:  ?? The date and time.  ?? The amount of drainage.  General instructions  ?? Rest at home for 1-2 days after your procedure. Return to your normal activities as told by your  health care provider.  ?? Do not take baths, swim, or use a hot tub for 24 hours after your procedure, or until your health care provider says that this is okay.  ?? Take over-the-counter and prescription medicines only as told by your health care provider.  ?? Keep all follow-up visits as told by your health care provider. This is important.  Contact a health care provider if:  ?? You have less than 10 mL of drainage a day for 2-3 days in a row, or as directed by your health care provider.  ?? You have more redness, swelling, or pain around your incision area.  ?? You have more fluid or blood coming from your incision area.  ?? Your incision area feels warm to the touch.  ?? You have pus or a bad smell coming from your incision area.  ?? You have fluid leaking from around your catheter (instead of through your catheter).  ?? You have a fever or chills.  ?? You have pain that does not get better with medicine.  Get help right away if:  ?? Your catheter comes out.  ?? You suddenly stop having drainage from your catheter.  ?? You suddenly have blood in the fluid that is draining from your catheter.  ?? You become dizzy or you faint.  ?? You develop a rash.  ?? You have nausea or vomiting.  ?? You   have difficulty breathing or you feel short of breath.  ?? You develop chest pain.  ?? You have problems with your speech or vision.  ?? You have trouble balancing or moving your arms or legs.  Summary  ?? It is common to have a small amount of bruising and discomfort in the area where the drainage tube (catheter) was placed.  ?? You may be directed to record the amount of drainage from the bag every time you empty it.  ?? Follow instructions from your health care provider about emptying and cleaning your catheter and collection bag.  This information is not intended to replace advice given to you by your health care provider. Make sure you discuss any questions you have with your health care provider.  Document Released: 12/09/2013 Document  Revised: 06/16/2016 Document Reviewed: 06/16/2016  [employer]vier Interactive Patient Education ?? 2017 [employer]

## 2017-05-05 NOTE — Unmapped (Signed)
Interventional Radiology Consult/Pre-Procedure Note  Date: 05/05/2017  Patient: Diane Stafford  MRN: [id number]      IR Procedure Request Received and Reviewed.    HPI: 31 -year-old female with history of bile leak after laparoscopic cholecystectomy on 02/13/2017. Patient underwent ERCP biliary stenting x 2 and IR percutaneous drain placement at outside hospital into a bile leak. 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     She currently does not have any pain or fevers. There has been no drain output the past 2 weeks.     ROS:   General: Denies fevers, chills or weakness  Cardiovascular: Denies chest pain, chest pressure or palpitations  Respiratory: Denies shortness of breath, wheezing or cough  GI: Denies abdominal pain or n/v/d  Neurological: Denies headaches, dizziness, gait problems, or seizures  Extremities: Denies swelling or numbness/tingling    History:  Past Medical History:   Diagnosis Date   ??? 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 Biloma following surgery 02/2017   ??? History of laparoscopic cholecystectomy 02/13/2017    [facility]   ??? Menarche     age 31     Past Surgical History:   Procedure Laterality Date   ??? CESAREAN SECTION      x 4   ??? CHOLECYSTECTOMY     ??? ERCP     ??? ERCP N/A 03/05/2017    Procedure: ERCP;  Surgeon: Floreen Comber, MD;  Location: [facility];  Service: Gastroenterology;  Laterality: N/A;   ??? ERCP N/A 03/23/2017    Procedure: ERCP w/ stent exhange;  Surgeon: Floreen Comber, MD;  Location: [facility];  Service: Gastroenterology;  Laterality: N/A;   ??? TUBAL LIGATION     ??? WISDOM TOOTH EXTRACTION  2010    two       Pertinent Patient Information:  No Known Allergies  Lab Results   Component Value Date    WBC 4.8 04/04/2017    HGB 12.8 04/04/2017    HCT 39.5 04/04/2017    MCV 84.9 04/04/2017    PLT 217 04/04/2017     Lab Results   Component Value Date    INR 1.0 04/04/2017    INR 1.0 04/03/2017    INR 1.0 04/02/2017     PROTIME 13.0 04/04/2017    PROTIME 13.3 04/03/2017    PROTIME 13.4 04/02/2017     Lab Results   Component Value Date    CREATININE 0.73 04/04/2017     Lab Results   Component Value Date    ALT 74 (H) 04/04/2017    AST 62 (H) 04/04/2017    ALKPHOS 234 (H) 04/04/2017    BILITOT 0.6 04/04/2017       ACA Classification:  ASA 2 - Patient with mild systemic disease with no functional limitations  The patient is in satisfactory condition for the procedure and sedation.    PE:  Constitutional: no acute distress appears stated age, normal affect.   Eyes: anicteric  Ears/Nose/Throat: no mucosal lesions or masses.   Cardiovascular: normal S1 and S2, regular rhythm, no murmur  Respiratory: breath sounds symmetric bilaterally, no adventitial sounds with auscultation, no respiratory distress.   Chest: no rib cage tenderness.  GI: abdomen soft nondistended and nontender  Neurological: symmetric motor  Extremities:  Psychiatric: oriented x 3, appropriate judgment, intact recent and remote memory.     Mallampati Score: 2      Sedation Plan/Type:  Local/Subcutaneous Lidocaine 1%  Moderate Sedation/Intravenous Fentanyl & Versed    Imaging Reviewed:  yes    Planned Procedure:   Abscessogram with possible intervention    Please call with questions.    Thank you,    Hermina Staggers, MD  Radiology, PGY-4  Please call with questions   [phone]

## 2017-05-05 NOTE — Unmapped (Signed)
INTERVENTIONAL RADIOLOGY  Post Procedure Note    Date: 05/05/2017  Patient: Diane Stafford  MRN: [id number]      INDICATION/DIAGNOSIS   Biloma    IR PROCEDURE  PERFORMED   Abscessogram, drain removal    Operators:   Dr. Kelle Darting  Dr. Lattie Corns Shaan Rhoads    PRE-OPERATIVE DIAGNOSIS   Hx of cholecystectomy    POST-OPERATIVE DIAGNOSIS   same    MEDICATIONS   Local/Subcutaneous Lidocaine 1%  Moderate Sedation/Intravenous Fentanyl & Versed    FINDINGS   No fistula or residual cavity on abscessogram  Drain removed    Specimens removed:  none    Estimated Blood Loss:  Less Than 15 mL     Complications:  None apparent.    RECOMMENDATIONS/FOLLOW-UP   No scheduled followup.  Dressing care and follow up as needed for recurrent symptoms.      Please refer to full dictated Radiology report for details of findings/procedure.        Leanor Rubenstein, MD  Interventional Radiology Fellow  [phone]; pgr [phone]

## 2017-05-10 ENCOUNTER — Ambulatory Visit (INDEPENDENT_AMBULATORY_CARE_PROVIDER_SITE_OTHER): Admitting: Family

## 2017-05-10 ENCOUNTER — Encounter: Payer: Self-pay | Admitting: Family

## 2017-05-10 VITALS — BP 118/86 | HR 75 | Temp 98.2°F | Ht 61.0 in | Wt 156.0 lb

## 2017-05-10 DIAGNOSIS — Z23 Encounter for immunization: Secondary | ICD-10-CM

## 2017-05-10 DIAGNOSIS — M25552 Pain in left hip: Secondary | ICD-10-CM

## 2017-05-10 DIAGNOSIS — F419 Anxiety disorder, unspecified: Secondary | ICD-10-CM | POA: Diagnosis not present

## 2017-05-10 DIAGNOSIS — F329 Major depressive disorder, single episode, unspecified: Secondary | ICD-10-CM

## 2017-05-10 MED ORDER — SERTRALINE HCL 50 MG PO TABS
50.0000 mg | ORAL_TABLET | Freq: Every day | ORAL | 3 refills | Status: DC
Start: 2017-05-10 — End: 2019-03-14

## 2017-05-10 MED ORDER — BUPROPION HCL ER (XL) 300 MG PO TB24: 300.0000 mg | ORAL_TABLET | Freq: Every day | ORAL | 1 refills | Status: DC

## 2017-05-10 MED ORDER — MELOXICAM 7.5 MG PO TABS: 7.5000 mg | ORAL_TABLET | Freq: Every day | ORAL | 0 refills | Status: DC

## 2017-05-10 MED ORDER — PREDNISONE 10 MG PO TABS: ORAL_TABLET | ORAL | 0 refills | Status: DC

## 2017-05-10 MED ORDER — ALPRAZOLAM 0.5 MG PO TABS: 0.5000 mg | ORAL_TABLET | Freq: Every evening | ORAL | 1 refills | Status: DC | PRN

## 2017-05-10 NOTE — Patient Instructions (Signed)
Trial zoloft  Use xanax rarely as discussed  For left hip, trial mobic daily ( take with food; no other over the counter aleve, advil, ibuprofen as this medication is similar)   short prednisone taper; if no improvement, please let us know  Trochanteric Bursitis Trochanteric bursitis is a condition that causes hip pain. Trochanteric bursitis happens when fluid-filled sacs (bursae) in the hip get irritated. Normally these sacs absorb shock and help strong bands of tissue (tendons) in your hip glide smoothly over each other and over your hip bones. What are the causes? This condition results from increased friction between the hip bones and the tendons that go over them. This condition can happen if you:  Have weak hips.  Use your hip muscles too much (overuse).  Get hit in the hip.  What increases the risk? This condition is more likely to develop in:  Women.  Adults who are middle-aged or older.  People with arthritis or a spinal condition.  People with weak buttocks muscles (gluteal muscles).  People who have one leg that is shorter than the other.  People who participate in certain kinds of athletic activities, such as: ? Running sports, especially long-distance running. ? Contact sports, like football or martial arts. ? Sports in which falls may occur, like skiing.  What are the signs or symptoms? The main symptom of this condition is pain and tenderness over the point of your hip. The pain may be:  Sharp and intense.  Dull and achy.  Felt on the outside of your thigh.  It may increase when you:  Lie on your side.  Walk or run.  Go up on stairs.  Sit.  Stand up after sitting.  Stand for long periods of time.  How is this diagnosed? This condition may be diagnosed based on:  Your symptoms.  Your medical history.  A physical exam.  Imaging tests, such as: ? X-rays to check your bones. ? An MRI or ultrasound to check your tendons and  muscles.  During your physical exam, your health care provider will check the movement and strength of your hip. He or she may press on the point of your hip to check for pain. How is this treated? This condition may be treated by:  Resting.  Reducing your activity.  Avoiding activities that cause pain.  Using crutches, a cane, or a walker to decrease the strain on your hip.  Taking medicine to help with swelling.  Having medicine injected into the bursae to help with swelling.  Using ice, heat, and massage therapy for pain relief.  Physical therapy exercises for strength and flexibility.  Surgery (rare).  Follow these instructions at home: Activity  Rest.  Avoid activities that cause pain.  Return to your normal activities as told by your health care provider. Ask your health care provider what activities are safe for you. Managing pain, stiffness, and swelling  Take over-the-counter and prescription medicines only as told by your health care provider.  If directed, apply heat to the injured area as told by your health care provider. ? Place a towel between your skin and the heat source. ? Leave the heat on for 20-30 minutes. ? Remove the heat if your skin turns bright red. This is especially important if you are unable to feel pain, heat, or cold. You may have a greater risk of getting burned.  If directed, apply ice to the injured area: ? Put ice in a plastic bag. ? Place a  towel between your skin and the bag. ? Leave the ice on for 20 minutes, 2-3 times a day. General instructions  If the affected leg is one that you use for driving, ask your health care provider when it is safe to drive.  Use crutches, a cane, or a walker as told by your health care provider.  If one of your legs is shorter than the other, get fitted for a shoe insert.  Lose weight if you are overweight. How is this prevented?  Wear supportive footwear that is appropriate for your  sport.  If you have hip pain, start any new exercise or sport slowly.  Maintain physical fitness, including: ? Strength. ? Flexibility. Contact a health care provider if:  Your pain does not improve with 2-4 weeks. Get help right away if:  You develop severe pain.  You have a fever.  You develop increased redness over your hip.  You have a change in your bowel function or bladder function.  You cannot control the muscles in your feet. This information is not intended to replace advice given to you by your health care provider. Make sure you discuss any questions you have with your health care provider. Document Released: 09/01/2004 Document Revised: 03/30/2016 Document Reviewed: 07/10/2015 Elsevier Interactive Patient Education  [employer].

## 2017-05-10 NOTE — Progress Notes (Signed)
Pre visit review using our clinic review tool, if applicable. No additional management support is needed unless otherwise documented below in the visit note. 

## 2017-05-10 NOTE — Progress Notes (Signed)
Subjective:    Patient ID: Rebecca Davies, female    DOB: 02-Jun-1986, 31 y.o.   MRN: [id number]  CC: Rebecca Davies is a 31 y.o. female who presents today for follow up.   HPI: Left hip pain x couple of weeks, unchanged.   Hurts to put up pants or put shoe on. Pain radiates down left leg to left knee. Hurts to fold left leg in [place] style.  No rash, urinary incontinence, h/o cancer. Hasn't tried any medications.   H/o left side sciatica. No injury.   Anxiety and depression- overall feels good on wellbutrin, has been on for a year plus. No thoughts of hurting herself or anyone else.  Uses xanax rarely; tends to me situational- such as when driving, job interview. Makes her really tired when takes xanax which is when wellbutrin was started.      HISTORY:  Past Medical History:  Diagnosis Date  . Anxiety   . Chicken pox   . Depression   . Migraine    No past surgical history on file. Family History  Problem Relation Age of Onset  . Cancer Mother        ovarian  . Hyperlipidemia Mother   . Mental illness Mother        anxiety and depression  . Alcohol abuse Father   . Hypertension Maternal Grandmother   . Heart disease Maternal Grandmother   . Stroke Maternal Grandmother   . Mental illness Maternal Grandmother   . Alcohol abuse Paternal Grandfather   . Cancer Paternal Grandfather     Allergies: Iodine and Shellfish allergy Current Outpatient Prescriptions on File Prior to Visit  Medication Sig Dispense Refill  . fluticasone (FLONASE) 50 MCG/ACT nasal spray Place 2 sprays into both nostrils daily. 16 g 6  . methocarbamol (ROBAXIN) 500 MG tablet Take 1 tablet (500 mg total) by mouth at bedtime. 30 tablet 1   Current Facility-Administered Medications on File Prior to Visit  Medication Dose Route Frequency Provider Last Rate Last Dose  . etonogestrel (NEXPLANON) implant 68 mg  68 mg Subdermal Once Farrel Conners, CNM        Social History  Substance Use Topics  .  Smoking status: Never Smoker  . Smokeless tobacco: Never Used  . Alcohol use 0.6 oz/week    1 Standard drinks or equivalent per week     Comment: 1 glass of wine every few weeks.     Review of Systems  Constitutional: Negative for chills and fever.  Respiratory: Negative for cough.   Cardiovascular: Negative for chest pain and palpitations.  Gastrointestinal: Negative for nausea and vomiting.  Musculoskeletal: Negative for back pain and joint swelling.  Neurological: Positive for numbness.  Psychiatric/Behavioral: Negative for sleep disturbance and suicidal ideas. The patient is nervous/anxious.       Objective:    BP 118/86   Pulse 75   Temp 98.2 F (36.8 C) (Oral)   Ht  (1.549 m)   Wt 156 lb (70.8 kg)   SpO2 99%   BMI 29.48 kg/m  BP Readings from Last 3 Encounters:  05/10/17 118/86  11/07/16 114/78  10/26/16 104/64   Wt Readings from Last 3 Encounters:  05/10/17 156 lb (70.8 kg)  11/07/16 153 lb 8 oz (69.6 kg)  10/26/16 156 lb (70.8 kg)    Physical Exam  Constitutional: She appears well-developed and well-nourished.  Eyes: Conjunctivae are normal.  Cardiovascular: Normal rate, regular rhythm, normal heart sounds and normal pulses.  Pulmonary/Chest: Effort normal and breath sounds normal. She has no wheezes. She has no rhonchi. She has no rales.  Musculoskeletal:       Left hip: She exhibits tenderness. She exhibits normal range of motion, normal strength and no bony tenderness.       Lumbar back: She exhibits normal range of motion, no tenderness, no bony tenderness, no swelling, no edema, no pain and no spasm.  Full range of motion with flexion, tension, lateral side bends. No bony tenderness. No pain, numbness, tingling elicited with single leg raise bilaterally.   Left  Hip: No limp or waddling gait. Full ROM with flexion and hip rotation in flexion.   pain of lateral hip with  (flexion-abduction-external rotation) test.    pain with deep palpation of  greater trochanter.    Neurological: She is alert. She has normal strength. No sensory deficit.  Reflex Scores:      Patellar reflexes are 2+ on the right side and 2+ on the left side. Sensation and strength intact bilateral lower extremities.  Skin: Skin is warm and dry.  Psychiatric: She has a normal mood and affect. Her speech is normal and behavior is normal. Thought content normal.  Vitals reviewed.      Assessment & Plan:   Problem List Items Addressed This Visit      Other   Anxiety and depression    Patient has more generalized anxiety and we discussed she would likely benefit more from an SSRI. I refilled her Xanax for her to use very sparingly which she is already doing. She will maintain on the Wellbutrin as well. Follow up 6 weeks to see how doing on zoloft. I looked up patient on [place] Controlled Substances Reporting System and saw no activity that raised concern of inappropriate use.        Relevant Medications   ALPRAZolam (XANAX) 0.5 MG tablet   buPROPion (WELLBUTRIN XL) 300 MG 24 hr tablet   sertraline (ZOLOFT) 50 MG tablet   Left hip pain - Primary    Differentials include trochanteric bursitis, sciatica.we'll start moving it, prednisone taper today. If no improvement with conservative therapy, discussed further evaluation and physical therapy.Declines xrays today. patient will let me know how she is doing.      Relevant Medications   meloxicam (MOBIC) 7.5 MG tablet   predniSONE (DELTASONE) 10 MG tablet    Other Visit Diagnoses    Need for immunization against influenza       Relevant Orders   Flu Vaccine QUAD 36+ mos IM (Completed)       I am having [name] start on sertraline, meloxicam, and predniSONE. I am also having her maintain her fluticasone, methocarbamol, ALPRAZolam, and buPROPion. We will continue to administer etonogestrel.   Meds ordered this encounter  Medications  . ALPRAZolam (XANAX) 0.5 MG tablet    Sig: Take 1 tablet (0.5 mg total)  by mouth at bedtime as needed for anxiety.    Dispense:  30 tablet    Refill:  1    Order Specific Question:   Supervising Provider    Answer:   Duncan Dull L [[id number]]  . buPROPion (WELLBUTRIN XL) 300 MG 24 hr tablet    Sig: Take 1 tablet (300 mg total) by mouth daily.    Dispense:  90 tablet    Refill:  1    Order Specific Question:   Supervising Provider    Answer:   Duncan Dull L [[id number]]  . sertraline (ZOLOFT)  50 MG tablet    Sig: Take 1 tablet (50 mg total) by mouth at bedtime.    Dispense:  90 tablet    Refill:  3    Order Specific Question:   Supervising Provider    Answer:   Duncan Dull L [[id number]]  . meloxicam (MOBIC) 7.5 MG tablet    Sig: Take 1 tablet (7.5 mg total) by mouth daily.    Dispense:  30 tablet    Refill:  0    Order Specific Question:   Supervising Provider    Answer:   Duncan Dull L [[id number]]  . predniSONE (DELTASONE) 10 MG tablet    Sig: Take 40 mg by mouth on day 1, then taper 10 mg daily until gone    Dispense:  10 tablet    Refill:  0    Order Specific Question:   Supervising Provider    Answer:   Sherlene Shams [[id number]]    Return precautions given.   Risks, benefits, and alternatives of the medications and treatment plan prescribed today were discussed, and patient expressed understanding.   Education regarding symptom management and diagnosis given to patient on AVS.  Continue to follow with Allegra Grana, FNP for routine health maintenance.   Jaymes Graff and I agreed with plan.   Rennie Plowman, FNP

## 2017-05-10 NOTE — Assessment & Plan Note (Signed)
Differentials include trochanteric bursitis, sciatica.we'll start moving it, prednisone taper today. If no improvement with conservative therapy, discussed further evaluation and physical therapy.Declines xrays today. patient will let me know how she is doing.

## 2017-05-10 NOTE — Assessment & Plan Note (Signed)
Patient has more generalized anxiety and we discussed she would likely benefit more from an SSRI. I refilled her Xanax for her to use very sparingly which she is already doing. She will maintain on the Wellbutrin as well. Follow up 6 weeks to see how doing on zoloft. I looked up patient on [place] Controlled Substances Reporting System and saw no activity that raised concern of inappropriate use.

## 2017-05-11 ENCOUNTER — Inpatient Hospital Stay: Admit: 2017-05-11

## 2017-05-17 ENCOUNTER — Other Ambulatory Visit: Admit: 2017-05-17 | Payer: Medicaid (Managed Care)

## 2017-05-17 ENCOUNTER — Ambulatory Visit: Admit: 2017-05-17 | Discharge: 2017-05-17 | Payer: Medicaid (Managed Care)

## 2017-05-17 DIAGNOSIS — S3613XA Injury of bile duct, initial encounter: Secondary | ICD-10-CM

## 2017-05-17 LAB — RENAL FUNCTION PANEL W/EGFR
Albumin: 4.3 g/dL (ref 3.5–5.7)
Anion Gap: 9 mmol/L (ref 3–16)
BUN: 11 mg/dL (ref 7–25)
CO2: 24 mmol/L (ref 21–33)
Calcium: 9.5 mg/dL (ref 8.6–10.3)
Chloride: 107 mmol/L (ref 98–110)
Creatinine: 0.79 mg/dL (ref 0.60–1.30)
Glucose: 85 mg/dL (ref 70–100)
Osmolality, Calculated: 289 mOsm/kg (ref 278–305)
Phosphorus: 3.8 mg/dL (ref 2.1–4.7)
Potassium: 4.2 mmol/L (ref 3.5–5.3)
Sodium: 140 mmol/L (ref 133–146)
eGFR AA CKD-EPI: >90 See note.
eGFR NONAA CKD-EPI: >90 See note.

## 2017-05-17 LAB — DIFFERENTIAL
Basophils Absolute: 32 /uL (ref 0–200)
Basophils Relative: 0.7 % (ref 0.0–1.0)
Eosinophils Absolute: 167 /uL (ref 15–500)
Eosinophils Relative: 3.7 % (ref 0.0–8.0)
Lymphocytes Absolute: 1904 /uL (ref 850–3900)
Lymphocytes Relative: 42.3 % (ref 15.0–45.0)
Monocytes Absolute: 419 /uL (ref 200–950)
Monocytes Relative: 9.3 % (ref 0.0–12.0)
Neutrophils Absolute: 1980 /uL (ref 1500–7800)
Neutrophils Relative: 44 % (ref 40.0–80.0)
nRBC: 0 /100 WBC (ref 0–0)

## 2017-05-17 LAB — HEPATIC FUNCTION PANEL
ALT: 26 U/L (ref 7–52)
AST: 26 U/L (ref 13–39)
Albumin: 4.3 g/dL (ref 3.5–5.7)
Alkaline Phosphatase: 171 U/L (ref 36–125)
Bilirubin, Direct: 0.13 mg/dL (ref 0.00–0.40)
Bilirubin, Indirect: 0.67 mg/dL (ref 0.00–1.10)
Total Bilirubin: 0.8 mg/dL (ref 0.0–1.5)
Total Protein: 7 g/dL (ref 6.4–8.9)

## 2017-05-17 LAB — CBC
Hematocrit: 41.8 % (ref 35.0–45.0)
Hemoglobin: 14.1 g/dL (ref 11.7–15.5)
MCH: 27.6 pg (ref 27.0–33.0)
MCHC: 33.8 g/dL (ref 32.0–36.0)
MCV: 81.7 fL (ref 80.0–100.0)
MPV: 8.6 fL (ref 7.5–11.5)
Platelets: 210 10*3/uL (ref 140–400)
RBC: 5.11 10*6/uL (ref 3.80–5.10)
RDW: 14.5 % (ref 11.0–15.0)
WBC: 4.5 10*3/uL (ref 3.8–10.8)

## 2017-05-17 NOTE — Unmapped (Signed)
Patient: Diane Stafford Age: 31 y.o.  MRN: [id number]      Reason for Visit: evaluation for bariatric surgery    A 31 y.o. female who presents after drain placmeent and now recent removal for bilary leak after cholecystecomy at an OSH.  She is doing well.  Denies fevers or nausea.  She does state that she has some mild abdominal pain. No other associated symptoms.       Review of Systems     Constitutional: Negative for activity change, chills, diaphoresis, fatigue and fever.   HENT: Negative  Eyes: Negative.    Respiratory: Negative for cough, chest tightness, shortness of breath and wheezing.    Cardiovascular: Negative for chest pain.   Gastrointestinal: Negative for abdominal distention, abdominal pain, blood in stool, nausea and vomiting  Endocrine: Negative.    Genitourinary: Negative.    Musculoskeletal: Negative.    Skin: Negative.    Allergic/Immunologic: Negative.    Neurological: Negative for light-headedness and headaches.   Hematological: Negative.    Psychiatric/Behavioral: Negative.          Vital Signs:  BP Readings from Last 3 Encounters:   05/17/17 120/82   05/05/17 129/81   04/04/17 101/66    Wt Readings from Last 3 Encounters:   05/17/17 200 lb (90.7 kg)   04/04/17 199 lb 11.2 oz (90.6 kg)   03/29/17 194 lb 6 oz (88.2 kg)      BMI: Estimated body mass index is 37.79 kg/m?? as calculated from the following:    Height as of this encounter: 5' 1 (1.549 m).    Weight as of this encounter: 200 lb (90.7 kg).    BSA: Estimated body surface area is 1.98 meters squared as calculated from the following:    Height as of this encounter: 5' 1 (1.549 m).    Weight as of this encounter: 200 lb (90.7 kg).  Vitals:    05/17/17 1101   BP: 120/82   Pulse: 84   Resp: 15   Weight: 200 lb (90.7 kg)   Height: 5' 1 (1.549 m)        Past Medical History:   Diagnosis Date   ??? 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 Biloma following surgery 02/2017   ??? History of laparoscopic cholecystectomy 02/13/2017    [facility]   ???  Menarche     age 78       Past Surgical History:   Procedure Laterality Date   ??? CESAREAN SECTION      x 4   ??? CHOLECYSTECTOMY     ??? ERCP     ??? ERCP N/A 03/05/2017    Procedure: ERCP;  Surgeon: Floreen Comber, MD;  Location: [facility];  Service: Gastroenterology;  Laterality: N/A;   ??? ERCP N/A 03/23/2017    Procedure: ERCP w/ stent exhange;  Surgeon: Floreen Comber, MD;  Location: [facility];  Service: Gastroenterology;  Laterality: N/A;   ??? TUBAL LIGATION     ??? WISDOM TOOTH EXTRACTION  2010    two       Family History   Problem Relation Age of Onset   ??? Heart disease Maternal Grandmother         aorta valve problem   ??? Hypertension Maternal Grandmother    ??? Alzheimer's disease Maternal Grandmother    ??? Aneurysm Maternal Grandfather         brain   ??? Hypertension Maternal Grandfather    ??? Mental illness Other  aunt-psych issues   ??? Other Other         2 uncles had open heart surgery   ??? Hypertension Other         two uncles       Social History     Social History   ??? Marital status: Single     Spouse name: N/A   ??? Number of children: N/A   ??? Years of education: N/A     Occupational History   ??? Not on file.     Social History Main Topics   ??? Smoking status: Former Smoker     Packs/day: 1.00     Years: 10.00   ??? Smokeless tobacco: Never Used      Comment: 11-24-2009   ??? Alcohol use Yes      Comment: 7 drinks   ??? Drug use: No      Comment: 11-24-2009   ??? Sexual activity: Not Currently     Other Topics Concern   ??? Not on file     Social History Narrative   ??? No narrative on file         Physical Exam:   General: no apparent distress, conversant  Eyes: anicteric sclera, moist conjunctiva, pupils equal and round reactive to light  HENT: atraumatic mucous membranes moist  Neck: trachea midline, full range of motion, no thyromegaly or adenopathy  Cardiac: regular rate and rhythm, no murmurs, rubs, and gallops  Respiratory: clear to auscultation bilaterally, normal respiratory effort  Gastrointestinal: abdomen is soft  nontender, nondistended, no palpable masses  Extremity: warm, no clubbing, cyanosis, or edema  Lymph: no palpable lymphadenopathy  Skin: no rashes or ulcers, normal temperature and turgor  Psych: appropriate affect, alert and oriented to person, place, and time      Labs:      Lab name 04/04/17  0611 05/17/17  1122   HEMOGLOBIN 12.8 14.1   HEMATOCRIT 39.5 41.8   MEAN CORPUSCULAR VOLUME 84.9 81.7   PLATELETS 217 210   SODIUM 137 140   POTASSIUM 3.8 4.2   CHLORIDE 102 107   CO2 26 24   BUN 12 11   CREATININE 0.73 0.79   GLUCOSE 87 85   PHOSPHORUS  --  3.8   ALBUMIN 4.1 4.3   4.3   CALCIUM 9.9 9.5   AST 62* 26   ALT 74* 26   BILIRUBIN TOTAL 0.6 0.8   ALK PHOS 234* 171*   INR 1.0  --                    ASSESSMENT:   [name] is a 31 y.o. female with PMHx significant for bile leak s/p lap chole and subsequent treatment with IR drainage and ERCP with stent placement.  She had her drain removed a couple weeks ago and is doing fairly well.  No fevers or emesis.  However, she is having some abdominal pain, which she says is not associated with any other symptoms.  We can continue to observe for now.  Should she develop increasing pain, or fevers associated with current pain, then she should make an appt.            Harlene Salts MD  Associate Professor of Surgery  [facility]     05/19/2017 12:34 PM

## 2017-06-23 NOTE — Unmapped (Signed)
Staff message sent to [facility] requesting she contact the patient to reschedule.

## 2017-06-23 NOTE — Unmapped (Signed)
-----   Message from Tammi Klippel, RN sent at 06/16/2017  9:53 AM EST -----  Regarding: canceled procedure  Barb, pt wanted to cancel ercp scheduled for 06/23/17 at 10:00am. Was instructed to call Dr Michaelle Copas office to reschedule.  Lorene Dy

## 2017-06-23 NOTE — Unmapped (Signed)
Late entry: Called patient to confirm cancellation and to reschedule. States she did want to cancel and she is driving at the moment and will call back to reschedule.

## 2017-07-03 NOTE — Unmapped (Signed)
-----   Message from Renee Ramus, RN sent at 06/23/2017  3:49 PM EST -----  Regarding: FW: canceled procedure  Pam,  Could you please put this one on your radar to reschedule? I called her Wednesday but she was driving and did not want to reschedule at that time.  Thanks!  Barb  ----- Message -----  From: Tammi Klippel, RN  Sent: 06/16/2017   9:53 AM  To: Hoyle Sauer, Floreen Comber, MD, #  Subject: canceled procedure                               Lesle Reek, pt wanted to cancel ercp scheduled for 06/23/17 at 10:00am. Was instructed to call Dr Michaelle Copas office to reschedule.  Lorene Dy

## 2017-07-03 NOTE — Telephone Encounter (Signed)
Spoke w/ patient and she did not want to reschedule at this time due to having a new job and will be in [place] for 2 weeks,I attempted to schedule her out a few weeks but did not want to schedule at this time and said she will call back when ready to have procedure, gave my ph# for callback when ready to.

## 2018-01-10 NOTE — Telephone Encounter (Signed)
Message left for return call in order to obtain new mailing address.

## 2018-01-10 NOTE — Unmapped (Signed)
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  She will eventually develop a complication and everyone will be confused due to her atypical anatomy.           Please find out her current mailing address.  Write a letter to her emphasizing the fact that she needs follow-up and why.  Include color copies of her procedure notes (Barb can print these).  Give everything to Lesle Reek to send by certified mail and document everything in Epic.    Thanks.  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 Pam tried to schedule her in Nov/2018 but patient was not able to schedule at the time. I called her and checked with her today. She stated that she filling fine but she moved to [place] and she has no car or anyone to give her a ride to come to [place]. I encouraged her to come here for stent removal and reassessment at list once but if she couldn't make it, I recommended to go to the university program there and let them talk to you and get information prior to proceeding to her procedure. I informed her that our office will call again after getting your thought on the issue. What do you think will be the best way forward?     Thank you,     Michiel Sites

## 2018-01-12 NOTE — Telephone Encounter (Signed)
Again message left for return call requesting new mailing address. Also attempted to contact emergency contact listed Diane Stafford however number listed has been disconnected.

## 2018-01-12 NOTE — Unmapped (Signed)
I've spoken with the patient and entered her correct mailing address in Epic. She has not had any follow up in her care and will need a referral to someone at the [facility] or a physician in [place] as she does not reliable transportation to come back to [facility]. I did emphasize to the patient the importance of necessary follow in order to prevent future complication. Staff message sent to Dr. Jeanella Flattery and [name].

## 2018-09-19 ENCOUNTER — Ambulatory Visit (INDEPENDENT_AMBULATORY_CARE_PROVIDER_SITE_OTHER): Payer: Managed Care, Other (non HMO) | Admitting: Obstetrics and Gynecology

## 2018-09-19 ENCOUNTER — Encounter: Payer: Self-pay | Admitting: Obstetrics and Gynecology

## 2018-09-19 VITALS — BP 120/80 | Ht 60.0 in | Wt 152.0 lb

## 2018-09-19 DIAGNOSIS — Z1239 Encounter for other screening for malignant neoplasm of breast: Secondary | ICD-10-CM

## 2018-09-19 DIAGNOSIS — Z01419 Encounter for gynecological examination (general) (routine) without abnormal findings: Secondary | ICD-10-CM | POA: Diagnosis not present

## 2018-09-19 DIAGNOSIS — Z23 Encounter for immunization: Secondary | ICD-10-CM

## 2018-09-19 DIAGNOSIS — Z113 Encounter for screening for infections with a predominantly sexual mode of transmission: Secondary | ICD-10-CM

## 2018-09-19 DIAGNOSIS — Z124 Encounter for screening for malignant neoplasm of cervix: Secondary | ICD-10-CM

## 2018-09-19 NOTE — Progress Notes (Signed)
Gynecology Annual Exam   PCP: Burnard Hawthorne, FNP  Chief Complaint:  Chief Complaint  Patient presents with  . Gynecologic Exam  . Contraception    History of Present Illness: Patient is a 32 y.o. G1P1001 presents for annual exam. The patient has no complaints today.   LMP: No LMP recorded. Patient has had an implant.  The patient is sexually active. She currently uses Nexplanon for contraception. She denies dyspareunia.  The patient does not perform self breast exams.  There is notable family history of breast or ovarian cancer in her family. Prior negative BRCA testing  The patient wears seatbelts: yes.   The patient has regular exercise: not asked.    The patient denies current symptoms of depression.    Review of Systems: Review of Systems  Constitutional: Negative for chills and fever.  HENT: Negative for congestion.   Eyes: Positive for redness. Negative for pain.  Respiratory: Positive for cough. Negative for shortness of breath.   Cardiovascular: Negative for chest pain and palpitations.  Gastrointestinal: Negative for abdominal pain, constipation, diarrhea, heartburn, nausea and vomiting.  Genitourinary: Positive for dysuria and urgency. Negative for frequency.  Skin: Negative for itching and rash.  Neurological: Negative for dizziness and headaches.  Endo/Heme/Allergies: Negative for polydipsia. Bruises/bleeds easily.  Psychiatric/Behavioral: Negative for depression.    Past Medical History:  Past Medical History:  Diagnosis Date  . Anxiety   . Chicken pox   . Depression   . Migraine     Past Surgical History:  History reviewed. No pertinent surgical history.  Gynecologic History:  No LMP recorded. Patient has had an implant. Contraception:10/26/2016 Nexplanon Last Pap: Results were:12/06/2013 no abnormalities   Obstetric History: G1P1001  Family History:  Family History  Problem Relation Age of Onset  . Cancer Mother        ovarian  .  Hyperlipidemia Mother   . Mental illness Mother        anxiety and depression  . Alcohol abuse Father   . Hypertension Maternal Grandmother   . Heart disease Maternal Grandmother   . Stroke Maternal Grandmother   . Mental illness Maternal Grandmother   . Alcohol abuse Paternal Grandfather   . Cancer Paternal Grandfather   . Breast cancer Paternal Grandmother   . Breast cancer Paternal Great-grandmother   . Breast cancer Paternal Aunt     Social History:  Social History   Socioeconomic History  . Marital status: Married    Spouse name: Not on file  . Number of children: Not on file  . Years of education: Not on file  . Highest education level: Not on file  Occupational History  . Not on file  Social Needs  . Financial resource strain: Not on file  . Food insecurity:    Worry: Not on file    Inability: Not on file  . Transportation needs:    Medical: Not on file    Non-medical: Not on file  Tobacco Use  . Smoking status: Never Smoker  . Smokeless tobacco: Never Used  Substance and Sexual Activity  . Alcohol use: Yes    Alcohol/week: 1.0 standard drinks    Types: 1 Standard drinks or equivalent per week    Comment: 1 glass of wine every few weeks.   . Drug use: No  . Sexual activity: Yes    Partners: Male    Birth control/protection: Implant    Comment: Husband  Lifestyle  . Physical activity:  Days per week: Not on file    Minutes per session: Not on file  . Stress: Not on file  Relationships  . Social connections:    Talks on phone: Not on file    Gets together: Not on file    Attends religious service: Not on file    Active member of club or organization: Not on file    Attends meetings of clubs or organizations: Not on file    Relationship status: Not on file  . Intimate partner violence:    Fear of current or ex partner: Not on file    Emotionally abused: Not on file    Physically abused: Not on file    Forced sexual activity: Not on file  Other  Topics Concern  . 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 Iodine Other (See Comments)    Blisters  . Shellfish Allergy Swelling and Rash    Medications: Prior to Admission medications   Medication Sig Start Date End Date Taking? Authorizing Provider  ALPRAZolam Duanne Moron) 0.5 MG tablet Take 1 tablet (0.5 mg total) by mouth at bedtime as needed for anxiety. 05/10/17  Yes Burnard Hawthorne, FNP  buPROPion (WELLBUTRIN XL) 300 MG 24 hr tablet Take 1 tablet (300 mg total) by mouth daily. Patient not taking: Reported on 09/19/2018 05/10/17   Burnard Hawthorne, FNP  fluticasone [facility]) 50 MCG/ACT nasal spray Place 2 sprays into both nostrils daily. Patient not taking: Reported on 09/19/2018 08/21/15   Rubbie Battiest, RN  meloxicam (MOBIC) 7.5 MG tablet Take 1 tablet (7.5 mg total) by mouth daily. Patient not taking: Reported on 09/19/2018 05/10/17   Burnard Hawthorne, FNP  methocarbamol (ROBAXIN) 500 MG tablet Take 1 tablet (500 mg total) by mouth at bedtime. Patient not taking: Reported on 09/19/2018 05/09/16   Coral Spikes, DO  predniSONE (DELTASONE) 10 MG tablet Take 40 mg by mouth on day 1, then taper 10 mg daily until gone Patient not taking: Reported on 09/19/2018 05/10/17   Burnard Hawthorne, FNP  sertraline (ZOLOFT) 50 MG tablet Take 1 tablet (50 mg total) by mouth at bedtime. Patient not taking: Reported on 09/19/2018 05/10/17   Burnard Hawthorne, FNP    Physical Exam Vitals: Blood pressure 120/80, height 5' (1.524 m), weight 152 lb (68.9 kg).  General: NAD HEENT: normocephalic, anicteric Thyroid: no enlargement, no palpable nodules Pulmonary: No increased work of breathing, CTAB Cardiovascular: RRR, distal pulses 2+ Breast: Breast symmetrical, no  tenderness, no palpable nodules or masses, no skin or nipple retraction present, no nipple discharge.  No axillary or supraclavicular lymphadenopathy. Abdomen: NABS, soft, non-tender, non-distended.  Umbilicus without lesions.  No hepatomegaly, splenomegaly or masses palpable. No evidence of hernia  Genitourinary:  External: Normal external female genitalia.  Normal urethral meatus, normal Bartholin's and Skene's glands.    Vagina: Normal vaginal mucosa, no evidence of prolapse.    Cervix: Grossly normal in appearance, no bleeding  Uterus: Non-enlarged, mobile, normal contour.  No CMT  Adnexa: ovaries non-enlarged, no adnexal masses  Rectal: deferred  Lymphatic: no evidence of inguinal lymphadenopathy Extremities: no edema, erythema, or tenderness Neurologic: Grossly intact Psychiatric: mood appropriate, affect  full  Female chaperone present for pelvic and breast  portions of the physical exam    Assessment: 33 y.o. G1P1001 routine annual exam  Plan: Problem List Items Addressed This Visit    None    Visit Diagnoses    Encounter for gynecological examination without abnormal finding    -  Primary   Need for immunization against influenza       Relevant Orders   Flu Vaccine QUAD 36+ mos IM (Completed)   Screening for malignant neoplasm of cervix       Relevant Orders   PapIG, CtNgTv, HPV, rfx 16/18   Routine screening for STI (sexually transmitted infection)       Relevant Orders   HEP, RPR, HIV Panel (Completed)   PapIG, CtNgTv, HPV, rfx 16/18   Breast screening          1) STI screening  wasoffered and accepted  2)  ASCCP guidelines and rational discussed.  Patient opts for every 3 years screening interval  3) Contraception - the patient is currently using  Nexplanon.  She is happy with her current form of contraception and plans to continue  4) Routine healthcare maintenance including cholesterol, diabetes screening discussed managed by PCP  5) Return in about 1 year  (around 09/20/2019) for annual.   Malachy Mood, MD, [place], [facility] 09/19/2018, 2:53 PM

## 2018-09-20 LAB — HEP, RPR, HIV PANEL
HIV Screen 4th Generation wRfx: NONREACTIVE
Hepatitis B Surface Ag: NEGATIVE
RPR Ser Ql: NONREACTIVE

## 2018-09-25 ENCOUNTER — Encounter: Payer: Self-pay | Admitting: Obstetrics and Gynecology

## 2018-09-25 ENCOUNTER — Other Ambulatory Visit: Payer: Self-pay | Admitting: Obstetrics and Gynecology

## 2018-09-25 LAB — PAPIG, CTNGTV, HPV, RFX 16/18
Chlamydia, Nuc. Acid Amp: POSITIVE — AB
Gonococcus, Nuc. Acid Amp: NEGATIVE
HPV, high-risk: POSITIVE — AB
Trich vag by NAA: NEGATIVE

## 2018-09-25 MED ORDER — AZITHROMYCIN 500 MG PO TABS: 1000.0000 mg | ORAL_TABLET | Freq: Once | ORAL | 0 refills | Status: DC

## 2018-09-25 MED ORDER — AZITHROMYCIN 500 MG PO TABS: 1000.0000 mg | ORAL_TABLET | Freq: Once | ORAL | 0 refills | Status: AC

## 2018-10-10 NOTE — Telephone Encounter (Signed)
I have called and left vociemail for patient to call back to be schedule

## 2018-10-29 ENCOUNTER — Encounter: Payer: Self-pay | Admitting: Obstetrics and Gynecology

## 2018-10-29 ENCOUNTER — Ambulatory Visit (INDEPENDENT_AMBULATORY_CARE_PROVIDER_SITE_OTHER): Payer: Managed Care, Other (non HMO) | Admitting: Obstetrics and Gynecology

## 2018-10-29 ENCOUNTER — Other Ambulatory Visit: Payer: Self-pay

## 2018-10-29 VITALS — BP 118/84 | HR 78 | Ht 61.0 in | Wt 156.0 lb

## 2018-10-29 DIAGNOSIS — N72 Inflammatory disease of cervix uteri: Secondary | ICD-10-CM

## 2018-10-29 DIAGNOSIS — B977 Papillomavirus as the cause of diseases classified elsewhere: Secondary | ICD-10-CM

## 2018-10-29 DIAGNOSIS — R87612 Low grade squamous intraepithelial lesion on cytologic smear of cervix (LGSIL): Secondary | ICD-10-CM

## 2018-10-29 DIAGNOSIS — R87810 Cervical high risk human papillomavirus (HPV) DNA test positive: Secondary | ICD-10-CM

## 2018-10-29 NOTE — Progress Notes (Signed)
   GYNECOLOGY CLINIC COLPOSCOPY PROCEDURE NOTE  33 y.o. 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 Discussed underlying role for HPV infection in the development of cervical dysplasia, its natural history and progression/regression, need for surveillance.  Is the patient  pregnant: No LMP: No LMP recorded. Patient has had an implant. Smoking status:  reports that she has never smoked. She has never used smokeless tobacco. Contraception: Nexplanon  Patient given informed consent, signed copy in the chart, time out was performed.  The patient was position in dorsal lithotomy position. Speculum was placed the cervix was visualized.   After application of acetic acid colposcopic inspection of the cervix was undertaken.   Colposcopy adequate, full visualization of transformation zone: Yes acetowhite lesion(s) noted at 4 o'clock; corresponding biopsies obtained.   ECC specimen obtained:  Yes  All specimens were labeled and sent to pathology.   Patient was given post procedure instructions.  Will follow up pathology and manage accordingly.  Routine preventative health maintenance measures emphasized.  OBGyn Exam  Vena Austria, MD, [facility], [facility]

## 2018-10-31 LAB — PATHOLOGY

## 2018-11-15 ENCOUNTER — Other Ambulatory Visit: Payer: Self-pay

## 2018-11-15 ENCOUNTER — Ambulatory Visit: Payer: Self-pay | Admitting: Family Medicine

## 2018-11-15 ENCOUNTER — Encounter: Payer: Self-pay | Admitting: Family Medicine

## 2018-11-15 ENCOUNTER — Ambulatory Visit (INDEPENDENT_AMBULATORY_CARE_PROVIDER_SITE_OTHER): Payer: Managed Care, Other (non HMO) | Admitting: Family Medicine

## 2018-11-15 DIAGNOSIS — K649 Unspecified hemorrhoids: Secondary | ICD-10-CM

## 2018-11-15 DIAGNOSIS — K5900 Constipation, unspecified: Secondary | ICD-10-CM | POA: Diagnosis not present

## 2018-11-15 MED ORDER — DOCUSATE SODIUM 100 MG PO CAPS: 100.0000 mg | ORAL_CAPSULE | Freq: Every day | ORAL | 2 refills | Status: DC

## 2018-11-15 MED ORDER — HYDROCORTISONE (PERIANAL) 2.5 % EX CREA: 1.0000 "application " | TOPICAL_CREAM | Freq: Two times a day (BID) | CUTANEOUS | 0 refills | Status: DC

## 2018-11-15 MED ORDER — PSYLLIUM 58.6 % PO POWD: 1.0000 | Freq: Every day | ORAL | 2 refills | Status: DC

## 2018-11-15 NOTE — Progress Notes (Signed)
Patient ID: Rebecca Davies, female   DOB: Nov 15, 1985, 33 y.o.   MRN: [id number]   Virtual Visit via Video Note  This visit type was conducted due to national recommendations for restrictions regarding the COVID-19 pandemic (e.g. social distancing).  This format is felt to be most appropriate for this patient at this time.  All issues noted in this document were discussed and addressed.  No physical exam was performed (except for noted visual exam findings with Video Visits).   I connected with Rebecca GraffAshley Norrod on 11/15/18 at  1:00 PM EDT by a video enabled telemedicine application and verified that I am speaking with the correct person using two identifiers. Location patient: home Location provider:[facility] Persons participating in the virtual visit: patient, provider  I discussed the limitations, risks, security and privacy concerns of performing an evaluation and management service by telephone and the availability of in person appointments. I also discussed with the patient that there may be a patient responsible charge related to this service. The patient expressed understanding and agreed to proceed.   Reason for visit: Hemorrhoid  HPI:  Patient had connected via video chat today to discuss suspected hemorrhoid.  Patient noticed a small outpouching on rectal area, and small amount of blood on toilet tissue after wiping.  States blood on toilet tissue was bright red.  States she has noticed herself straining more so with trying to have bowel movements, and has tried to increase water in her diet to see if this would help.  Denies any black, dark/tarry stools.  Denies any abdominal pain.  Denies any nausea, vomiting or diarrhea.  Denies chest pain, shortness of breath or wheezing.  Denies fever or chills.   ROS: See pertinent positives and negatives per HPI.  Past Medical History:  Diagnosis Date  . Anxiety   . Chicken pox   . Depression   . Migraine     No past surgical history on  file.  Family History  Problem Relation Age of Onset  . Cancer Mother        ovarian  . Hyperlipidemia Mother   . Mental illness Mother        anxiety and depression  . Alcohol abuse Father   . Hypertension Maternal Grandmother   . Heart disease Maternal Grandmother   . Stroke Maternal Grandmother   . Mental illness Maternal Grandmother   . Alcohol abuse Paternal Grandfather   . Cancer Paternal Grandfather   . Breast cancer Paternal Grandmother   . Breast cancer Paternal Great-grandmother   . Breast cancer Paternal Aunt    Social History   Tobacco Use  . Smoking status: Never Smoker  . Smokeless tobacco: Never Used  Substance Use Topics  . Alcohol use: Yes    Alcohol/week: 1.0 standard drinks    Types: 1 Standard drinks or equivalent per week    Comment: 1 glass of wine every few weeks.      Current Outpatient Medications:  .  ALPRAZolam (XANAX) 0.5 MG tablet, Take 1 tablet (0.5 mg total) by mouth at bedtime as needed for anxiety., Disp: 30 tablet, Rfl: 1 .  buPROPion (WELLBUTRIN XL) 300 MG 24 hr tablet, Take 1 tablet (300 mg total) by mouth daily., Disp: 90 tablet, Rfl: 1 .  meloxicam (MOBIC) 7.5 MG tablet, Take 1 tablet (7.5 mg total) by mouth daily. (Patient not taking: Reported on 09/19/2018), Disp: 30 tablet, Rfl: 0 .  methocarbamol (ROBAXIN) 500 MG tablet, Take 1 tablet (500 mg total) by  mouth at bedtime. (Patient not taking: Reported on 09/19/2018), Disp: 30 tablet, Rfl: 1 .  predniSONE (DELTASONE) 10 MG tablet, Take 40 mg by mouth on day 1, then taper 10 mg daily until gone (Patient not taking: Reported on 09/19/2018), Disp: 10 tablet, Rfl: 0 .  sertraline (ZOLOFT) 50 MG tablet, Take 1 tablet (50 mg total) by mouth at bedtime. (Patient not taking: Reported on 09/19/2018), Disp: 90 tablet, Rfl: 3  Current Facility-Administered Medications:  .  etonogestrel (NEXPLANON) implant 68 mg, 68 mg, Subdermal, Once, Sharen Hones, [place], CNM  EXAM:   GENERAL: alert, oriented,  appears well and in no acute distress  HEENT: atraumatic, conjunttiva clear, no obvious abnormalities on inspection of external nose and ears  NECK: normal movements of the head and neck  LUNGS: on inspection no signs of respiratory distress, breathing rate appears normal, no obvious gross SOB, gasping or wheezing  CV: no obvious cyanosis  MS: moves all visible extremities without noticeable abnormality  PSYCH/NEURO: pleasant and cooperative, no obvious depression or anxiety, speech and thought processing grossly intact  ASSESSMENT AND PLAN:  Discussed the following assessment and plan:  Hemorrhoids, unspecified hemorrhoid type - Plan: hydrocortisone (PROCTO-MED HC) 2.5 % rectal cream, docusate sodium (COLACE) 100 MG capsule, psyllium (METAMUCIL SMOOTH TEXTURE) 58.6 % powder  Constipation, unspecified constipation type - Plan: docusate sodium (COLACE) 100 MG capsule, psyllium (METAMUCIL SMOOTH TEXTURE) 58.6 % powder  I suspect patient does have a hemorrhoid related to straining from constipation.  Patient given topical hemorrhoid cream to help reduce hemorrhoid.  She will also begin Metamucil and Colace daily to help soften stools and make having a bowel movement easier to avoid straining which most likely caused the hemorrhoid in the first place.  Also encouraged to keep up good water intake, avoid lingering on toilet for extended periods of time, and trying to have bowel movement while in a more squatted style position.  Patient advised that if she is not seeing improvement in the next 2 weeks, to contact office and we can either do another follow-up virtual visit and/or have her come into the office for an office evaluation.   I discussed the assessment and treatment plan with the patient. The patient was provided an opportunity to ask questions and all were answered. The patient agreed with the plan and demonstrated an understanding of the instructions.   The patient was advised to  call back or seek an in-person evaluation if the symptoms worsen or if the condition fails to improve as anticipated.   Tracey Harries, FNP

## 2018-12-24 ENCOUNTER — Encounter: Payer: Self-pay | Admitting: Physician Assistant

## 2018-12-24 ENCOUNTER — Telehealth: Payer: Managed Care, Other (non HMO) | Admitting: Physician Assistant

## 2018-12-24 DIAGNOSIS — N390 Urinary tract infection, site not specified: Secondary | ICD-10-CM | POA: Diagnosis not present

## 2018-12-24 MED ORDER — NITROFURANTOIN MONOHYD MACRO 100 MG PO CAPS: 100.0000 mg | ORAL_CAPSULE | Freq: Two times a day (BID) | ORAL | 0 refills | Status: DC

## 2018-12-24 NOTE — Progress Notes (Signed)
We are sorry that you are not feeling well.  Here is how we plan to help!  Based on what you shared with me it looks like you most likely have a simple urinary tract infection.  A UTI (Urinary Tract Infection) is a bacterial infection of the bladder.  Most cases of urinary tract infections are simple to treat but a key part of your care is to encourage you to drink plenty of fluids and watch your symptoms carefully.  I have prescribed MacroBid 100 mg twice a day for 5 days.  Your symptoms should gradually improve. Call us if the burning in your urine worsens, you develop worsening fever, back pain or pelvic pain or if your symptoms do not resolve after completing the antibiotic.  Urinary tract infections can be prevented by drinking plenty of water to keep your body hydrated.  Also be sure when you wipe, wipe from front to back and don't hold it in!  If possible, empty your bladder every 4 hours.  Your e-visit answers were reviewed by a board certified advanced clinical practitioner to complete your personal care plan.  Depending on the condition, your plan could have included both over the counter or prescription medications.  If there is a problem please reply  once you have received a response from your provider.  Your safety is important to us.  If you have drug allergies check your prescription carefully.    You can use MyChart to ask questions about today's visit, request a non-urgent call back, or ask for a work or school excuse for 24 hours related to this e-Visit. If it has been greater than 24 hours you will need to follow up with your provider, or enter a new e-Visit to address those concerns.   You will get an e-mail in the next two days asking about your experience.  I hope that your e-visit has been valuable and will speed your recovery. Thank you for using e-visits.   I have spent 7 min in completion and review of this note- Belvia Gotschall PAC  

## 2019-02-20 ENCOUNTER — Telehealth (INDEPENDENT_AMBULATORY_CARE_PROVIDER_SITE_OTHER): Payer: Managed Care, Other (non HMO) | Admitting: Family Medicine

## 2019-02-20 ENCOUNTER — Other Ambulatory Visit: Payer: Self-pay

## 2019-02-20 DIAGNOSIS — R112 Nausea with vomiting, unspecified: Secondary | ICD-10-CM

## 2019-02-20 DIAGNOSIS — N390 Urinary tract infection, site not specified: Secondary | ICD-10-CM

## 2019-02-20 DIAGNOSIS — R1084 Generalized abdominal pain: Secondary | ICD-10-CM

## 2019-02-20 MED ORDER — CIPROFLOXACIN HCL 500 MG PO TABS: 500.0000 mg | ORAL_TABLET | Freq: Two times a day (BID) | ORAL | 0 refills | Status: DC

## 2019-02-20 MED ORDER — ONDANSETRON 4 MG PO TBDP: 4.0000 mg | ORAL_TABLET | Freq: Three times a day (TID) | ORAL | 2 refills | Status: DC | PRN

## 2019-02-20 NOTE — Progress Notes (Signed)
Patient ID: Rebecca Davies, female   DOB: 6/Rebecca Graff17/1987, 33 y.o.   MRN: [id number]    Virtual Visit via video Note  This visit type was conducted due to national recommendations for restrictions regarding the COVID-19 pandemic (e.g. social distancing).  This format is felt to be most appropriate for this patient at this time.  All issues noted in this document were discussed and addressed.  No physical exam was performed (except for noted visual exam findings with Video Visits).   I connected with Rebecca GraffAshley Davies today at  8:20 AM EDT by a video enabled telemedicine application and verified that I am speaking with the correct person using two identifiers. Location patient: home Location provider: work or home office Persons participating in the virtual visit: patient, provider  I discussed the limitations, risks, security and privacy concerns of performing an evaluation and management service by video and the availability of in person appointments. I also discussed with the patient that there may be a patient responsible charge related to this service. The patient expressed understanding and agreed to proceed.   HPI:  Patient and I connected via video to discuss abdominal pain episodes.  Patient states she will have severe abdominal pain every couple of months and it usually happens after eating something heavier.  First happened after eating C[place]un chicken pasta from [employer], had severe abdominal pain that required her to curl up into a ball and also cause her to vomit for 8 hours straight.  The next day she felt better.  The next episode occurred after eating [place] food, states she threw up for about 4 hours straight, and again had to curl up into a ball to help reduce abdominal pain.  Noticed the pain and vomiting another time after eating an [employer] roast beef sandwich.  States the pain will be all over her abdomen, but seems most up in the upper abdomen.  Usually after the initial severe pain and  vomiting, she will feel 90% better the next day with only some residual abdominal pain that she attributes to the severe vomiting.  Denies any feelings of heartburn.  Denies diarrhea.  Denies fever or chills.  Currently she has no cough, shortness of breath or wheezing, no chest pain.  Patient does report some dysuria and increased urinary frequency the past 2 to 3 days and believes she may be having a UTI coming on.   ROS: See pertinent positives and negatives per HPI.  Past Medical History:  Diagnosis Date   Anxiety    Chicken pox    Depression    Migraine       Family History  Problem Relation Age of Onset   Cancer Mother        ovarian   Hyperlipidemia Mother    Mental illness Mother        anxiety and depression   Alcohol abuse Father    Hypertension Maternal Grandmother    Heart disease Maternal Grandmother    Stroke Maternal Grandmother    Mental illness Maternal Grandmother    Alcohol abuse Paternal Grandfather    Cancer Paternal Grandfather    Breast cancer Paternal Grandmother    Breast cancer Paternal Great-grandmother    Breast cancer Paternal Aunt    Social History   Tobacco Use   Smoking status: Never Smoker   Smokeless tobacco: Never Used  Substance Use Topics   Alcohol use: Yes    Alcohol/week: 1.0 standard drinks    Types: 1 Standard drinks or equivalent  per week    Comment: 1 glass of wine every few weeks.     Current Outpatient Medications:    ALPRAZolam (XANAX) 0.5 MG tablet, Take 1 tablet (0.5 mg total) by mouth at bedtime as needed for anxiety., Disp: 30 tablet, Rfl: 1   buPROPion (WELLBUTRIN XL) 300 MG 24 hr tablet, Take 1 tablet (300 mg total) by mouth daily., Disp: 90 tablet, Rfl: 1   docusate sodium (COLACE) 100 MG capsule, Take 1 capsule (100 mg total) by mouth daily., Disp: 30 capsule, Rfl: 2   hydrocortisone (PROCTO-MED HC) 2.5 % rectal cream, Place 1 application rectally 2 (two) times daily., Disp: 30 g, Rfl:  0   meloxicam (MOBIC) 7.5 MG tablet, Take 1 tablet (7.5 mg total) by mouth daily., Disp: 30 tablet, Rfl: 0   methocarbamol (ROBAXIN) 500 MG tablet, Take 1 tablet (500 mg total) by mouth at bedtime., Disp: 30 tablet, Rfl: 1   nitrofurantoin, macrocrystal-monohydrate, (MACROBID) 100 MG capsule, Take 1 capsule (100 mg total) by mouth 2 (two) times daily., Disp: 10 capsule, Rfl: 0   predniSONE (DELTASONE) 10 MG tablet, Take 40 mg by mouth on day 1, then taper 10 mg daily until gone, Disp: 10 tablet, Rfl: 0   psyllium (METAMUCIL SMOOTH TEXTURE) 58.6 % powder, Take 1 packet by mouth daily., Disp: 283 g, Rfl: 2   sertraline (ZOLOFT) 50 MG tablet, Take 1 tablet (50 mg total) by mouth at bedtime., Disp: 90 tablet, Rfl: 3  Current Facility-Administered Medications:    etonogestrel (NEXPLANON) implant 68 mg, 68 mg, Subdermal, Once, Sharen HonesGutierrez, [place], CNM  EXAM:  GENERAL: alert, oriented, appears well and in no acute distress  HEENT: atraumatic, conjunttiva clear, no obvious abnormalities on inspection of external nose and ears  NECK: normal movements of the head and neck  LUNGS: on inspection no signs of respiratory distress, breathing rate appears normal, no obvious gross SOB, gasping or wheezing  CV: no obvious cyanosis  MS: moves all visible extremities without noticeable abnormality  PSYCH/NEURO: pleasant and cooperative, no obvious depression or anxiety, speech and thought processing grossly intact  ASSESSMENT AND PLAN:  Discussed the following assessment and plan:  Abdominal pain, nausea and vomiting- patient's abdominal pain and nausea/vomiting do appear to coincide with eating foods that are heavier or greasy year.  I am wondering if patient has some sort of food intolerance that is causing this.  We will get lab work including a celiac panel and amylase and lipase readings as well as metabolic panel, liver function panel and complete blood count.  We will also get abdominal CT to  investigate pain.  Patient advised to monitor and keep a log of any foods that seem to set off the abdominal pain.  Advised trying to eliminate certain food groups out of diet to see if this makes any sort of difference such as trying to avoid gluten or trying to avoid excess dairy.  I will also be sending in Zofran to use as needed for nausea. Once we have lab work and CT scan back we will better be able to determine next plan of care which most likely will include referral to GI.  UTI-patient's feelings of dysuria, increased urinary frequency consistent with a UTI.  We will treat with Cipro twice daily and she has been advised to increase water intake, avoid excess sugary caffeinated beverages, wear cotton underwear and always wipe front to back after using restroom.   I discussed the assessment and treatment plan with the patient.  The patient was provided an opportunity to ask questions and all were answered. The patient agreed with the plan and demonstrated an understanding of the instructions.   The patient was advised to call back or seek an in-person evaluation if the symptoms worsen or if the condition fails to improve as anticipated.   A total of 25  minutes were spent face-to-face with the patient during this encounter and over half of that time was spent on counseling and coordination of care. The patient was counseled on possible causes of abdominal pain, strategy to see if any sort of food group types are causing pain, use of Zofran, plan for lab work and imaging and possible referral to [place], FNP

## 2019-03-04 ENCOUNTER — Other Ambulatory Visit: Payer: Self-pay

## 2019-03-04 ENCOUNTER — Encounter: Payer: Self-pay | Admitting: Family Medicine

## 2019-03-04 ENCOUNTER — Other Ambulatory Visit: Payer: Self-pay | Admitting: Family Medicine

## 2019-03-04 ENCOUNTER — Ambulatory Visit (INDEPENDENT_AMBULATORY_CARE_PROVIDER_SITE_OTHER): Payer: Managed Care, Other (non HMO) | Admitting: Family Medicine

## 2019-03-04 VITALS — BP 102/76 | HR 74 | Temp 98.2°F | Resp 16 | Ht 61.0 in | Wt 151.0 lb

## 2019-03-04 DIAGNOSIS — R112 Nausea with vomiting, unspecified: Secondary | ICD-10-CM

## 2019-03-04 DIAGNOSIS — K649 Unspecified hemorrhoids: Secondary | ICD-10-CM

## 2019-03-04 DIAGNOSIS — R1084 Generalized abdominal pain: Secondary | ICD-10-CM

## 2019-03-04 DIAGNOSIS — N281 Cyst of kidney, acquired: Secondary | ICD-10-CM

## 2019-03-04 DIAGNOSIS — Z202 Contact with and (suspected) exposure to infections with a predominantly sexual mode of transmission: Secondary | ICD-10-CM | POA: Diagnosis not present

## 2019-03-04 DIAGNOSIS — N2889 Other specified disorders of kidney and ureter: Secondary | ICD-10-CM

## 2019-03-04 MED ORDER — CEFTRIAXONE SODIUM 250 MG IJ SOLR
250.0000 mg | Freq: Once | INTRAMUSCULAR | Status: AC
  Administered 2019-03-04: 14:00:00 250 mg via INTRAMUSCULAR

## 2019-03-04 MED ORDER — AZITHROMYCIN 500 MG PO TABS: ORAL_TABLET | ORAL | 0 refills | Status: DC

## 2019-03-04 NOTE — Progress Notes (Signed)
Subjective:    Patient ID: Rebecca Davies, female    DOB: 1986-02-21, 33 y.o.   MRN: [id number]  HPI   Patient presents to clinic due to possible STD exposure.  States a previous sexual partner had reached out to her and let her know that he tested positive for chlamydia.  Patient has no vaginal symptoms; denies discharge, vaginal irritation, pain with urination.  Patient also reports that her insurance company reach out to her and stated that CT scan of abdomen would not be covered to further evaluate her abdominal pain and episodes of nausea and vomiting.  States they recommended a ultrasound instead.  Also would like to consider seeing general surgery for hemorrhoid evaluation and removal.  Does use topical hemorrhoid cream with some effect, but at times hemorrhoids are very irritating and cause annoyance and pain when she wipes after having bowel movement.  Patient Active Problem List   Diagnosis Date Noted  . Left hip pain 05/10/2017  . 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 Routine general medical examination at a health care facility 09/04/2014  . Anxiety and depression 09/04/2014   Social History   Tobacco Use  . Smoking status: Never Smoker  . Smokeless tobacco: Never Used  Substance Use Topics  . Alcohol use: Yes    Alcohol/week: 1.0 standard drinks    Types: 1 Standard drinks or equivalent per week    Comment: 1 glass of wine every few weeks.     Review of Systems  Constitutional: Negative for chills, fatigue and fever.  HENT: Negative for congestion, ear pain, sinus pain and sore throat.   Eyes: Negative.   Respiratory: Negative for cough, shortness of breath and wheezing.   Cardiovascular: Negative for chest pain, palpitations and leg swelling.  Gastrointestinal: Negative for abdominal pain, diarrhea, nausea and vomiting.  Genitourinary: Negative for dysuria, frequency and urgency.  Musculoskeletal: Negative for arthralgias and myalgias.  Skin: Negative for color  change, pallor and rash.  Neurological: Negative for syncope, light-headedness and headaches.  Psychiatric/Behavioral: The patient is not nervous/anxious.    Objective:   Physical Exam Vitals signs and nursing note reviewed.  Constitutional:      General: She is not in acute distress.    Appearance: She is not ill-appearing, toxic-appearing or diaphoretic.  HENT:     Head: Normocephalic and atraumatic.  Cardiovascular:     Rate and Rhythm: Normal rate and regular rhythm.     Heart sounds: Normal heart sounds.  Pulmonary:     Effort: Pulmonary effort is normal.     Breath sounds: Normal breath sounds.  Abdominal:     General: Bowel sounds are normal. There is no distension.     Palpations: Abdomen is soft. There is no mass.     Tenderness: There is no abdominal tenderness. There is no right CVA tenderness, left CVA tenderness, guarding or rebound.     Hernia: No hernia is present. There is no hernia in the left inguinal area or right inguinal area.  Genitourinary:    General: Normal vulva.     Labia:        Right: No rash, tenderness, lesion or injury.        Left: No rash, tenderness, lesion or injury.      Vagina: No vaginal discharge.     Cervix: Normal.     Uterus: Not tender.      Adnexa:        Right: No tenderness.  Left: No tenderness.       Rectum: External hemorrhoid present.     Comments: STD testing collected and sent to lab Musculoskeletal:     Right lower leg: No edema.     Left lower leg: No edema.  Lymphadenopathy:     Lower Body: No right inguinal adenopathy. No left inguinal adenopathy.  Skin:    General: Skin is warm and dry.  Neurological:     General: No focal deficit present.     Mental Status: She is alert and oriented to person, place, and time.     Gait: Gait normal.  Psychiatric:        Mood and Affect: Mood normal.        Behavior: Behavior normal.        Thought Content: Thought content normal.        Judgment: Judgment normal.     Vitals:   03/04/19 1358  BP: 102/76  Pulse: 74  Resp: 16  Temp: 98.2 F (36.8 C)  SpO2: 97%       Assessment & Plan:   STD exposure - STD testing collected and sent to lab.  We will also include HIV screening and syphilis screening and blood work.  She will take 1 g of azithromycin and we will give her IM Rocephin 250 mg x 1 in clinic today to also cover gonorrhea just to be on the safe side.  Safe sex practices discussed including condom use.  Generalized abdominal pain/nausea and vomiting - patient will get the blood work today to further evaluate episodes of generalized abdominal pain that caused her at times to have severe nausea and vomiting with bad abdominal cramping.  We will cancel CT order and change to abdominal ultrasound.  Hemorrhoids - patient advised to continue using topical hemorrhoid cream to help shrink hemorrhoid and stool softener as needed to help make bowel movements here.  Advised patient I will place referral to general surgery for evaluation to see if she is a candidate for hemorrhoid removal surgery.  Patient advised will reach out to her with results of blood work and STD testing when available.  Also aware she will be contacted in regards to ultrasound appointment.  She is aware she can call clinic or return to clinic anytime if issues arise.

## 2019-03-04 NOTE — Telephone Encounter (Signed)
Please set up either in person visit for pelvic exam/std testing   If she cannot come in, we can do a DOXY visit and treat her without testing but she should be tested for chlamydia, gonorrhea, trichomonas and consider HIV testing as well so we can be sure no other STD is present  Thanks  LG

## 2019-03-05 ENCOUNTER — Telehealth: Payer: Self-pay | Admitting: Family

## 2019-03-05 NOTE — Telephone Encounter (Signed)
LMTCB with Ms. Berenice Primas from [place].

## 2019-03-05 NOTE — Telephone Encounter (Signed)
Copied from [place] [id number]. Topic: General - Other >> Mar 05, 2019  9:07 AM Keene Breath wrote: Reason for CRM: [facility] called to verify a test # for the patient that they received.  Please call back to verify.  CB# [phone].  If no one answers, please leave a voice mail.  It is a secure line.

## 2019-03-06 LAB — BASIC METABOLIC PANEL
BUN/Creatinine Ratio: 19 (ref 9–23)
BUN: 16 mg/dL (ref 6–20)
CO2: 20 mmol/L (ref 20–29)
Calcium: 9.6 mg/dL (ref 8.7–10.2)
Chloride: 105 mmol/L (ref 96–106)
Creatinine, Ser: 0.86 mg/dL (ref 0.57–1.00)
GFR calc Af Amer: 102 mL/min/{1.73_m2} (ref >59)
GFR calc non Af Amer: 88 mL/min/{1.73_m2} (ref >59)
Glucose: 94 mg/dL (ref 65–99)
Potassium: 4.3 mmol/L (ref 3.5–5.2)
Sodium: 140 mmol/L (ref 134–144)

## 2019-03-06 LAB — CBC WITH DIFFERENTIAL/PLATELET
Basophils Absolute: 0 10*3/uL (ref 0.0–0.2)
Basos: 0 %
EOS (ABSOLUTE): 0.1 10*3/uL (ref 0.0–0.4)
Eos: 1 %
Hematocrit: 44.9 % (ref 34.0–46.6)
Hemoglobin: 15.6 g/dL (ref 11.1–15.9)
Immature Grans (Abs): 0 10*3/uL (ref 0.0–0.1)
Immature Granulocytes: 0 %
Lymphocytes Absolute: 2.8 10*3/uL (ref 0.7–3.1)
Lymphs: 36 %
MCH: 29.7 pg (ref 26.6–33.0)
MCHC: 34.7 g/dL (ref 31.5–35.7)
MCV: 85 fL (ref 79–97)
Monocytes Absolute: 0.5 10*3/uL (ref 0.1–0.9)
Monocytes: 7 %
Neutrophils Absolute: 4.3 10*3/uL (ref 1.4–7.0)
Neutrophils: 56 %
Platelets: 208 10*3/uL (ref 150–450)
RBC: 5.26 x10E6/uL (ref 3.77–5.28)
RDW: 12.4 % (ref 11.7–15.4)
WBC: 7.8 10*3/uL (ref 3.4–10.8)

## 2019-03-06 LAB — GLIA (IGA/G) + TTG IGA
Antigliadin Abs, IgA: 5 units (ref 0–19)
Gliadin IgG: 2 units (ref 0–19)
Transglutaminase IgA: <2 U/mL (ref 0–3)

## 2019-03-06 LAB — HEPATIC FUNCTION PANEL
ALT: 49 IU/L — ABNORMAL HIGH (ref 0–32)
AST: 26 IU/L (ref 0–40)
Albumin: 4.8 g/dL (ref 3.8–4.8)
Alkaline Phosphatase: 90 IU/L (ref 39–117)
Bilirubin Total: 1.1 mg/dL (ref 0.0–1.2)
Bilirubin, Direct: 0.2 mg/dL (ref 0.00–0.40)
Total Protein: 7.2 g/dL (ref 6.0–8.5)

## 2019-03-06 LAB — AMYLASE: Amylase: 42 U/L (ref 31–110)

## 2019-03-06 LAB — RPR: RPR Ser Ql: NONREACTIVE

## 2019-03-06 LAB — HIV ANTIBODY (ROUTINE TESTING W REFLEX): HIV Screen 4th Generation wRfx: NONREACTIVE

## 2019-03-06 LAB — LIPASE: Lipase: 35 U/L (ref 14–72)

## 2019-03-06 NOTE — Telephone Encounter (Signed)
I called & spoke with [facility] to let her know to disregard swab per Philis Nettle, NP.

## 2019-03-06 NOTE — Telephone Encounter (Signed)
Rebecca Davies is calling back and would like a return call today

## 2019-03-07 ENCOUNTER — Other Ambulatory Visit: Payer: Self-pay

## 2019-03-07 DIAGNOSIS — Z20822 Contact with and (suspected) exposure to covid-19: Secondary | ICD-10-CM

## 2019-03-08 ENCOUNTER — Telehealth: Payer: Self-pay | Admitting: Lab

## 2019-03-08 NOTE — Telephone Encounter (Signed)
error 

## 2019-03-09 LAB — CHLAMYDIA/GONOCOCCUS/TRICHOMONAS, NAA
Chlamydia by NAA: NEGATIVE
Gonococcus by NAA: NEGATIVE
Trich vag by NAA: NEGATIVE

## 2019-03-09 LAB — NOVEL CORONAVIRUS, NAA: SARS-CoV-2, NAA: NOT DETECTED

## 2019-03-11 ENCOUNTER — Ambulatory Visit
Admission: RE | Admit: 2019-03-11 | Discharge: 2019-03-11 | Disposition: A | Discharge status: Home / Self Care | Payer: Managed Care, Other (non HMO) | Source: Ambulatory Visit | Attending: Family Medicine | Admitting: Family Medicine

## 2019-03-11 ENCOUNTER — Other Ambulatory Visit: Payer: Self-pay

## 2019-03-11 DIAGNOSIS — R1084 Generalized abdominal pain: Secondary | ICD-10-CM | POA: Diagnosis present

## 2019-03-11 DIAGNOSIS — R112 Nausea with vomiting, unspecified: Secondary | ICD-10-CM

## 2019-03-11 NOTE — Addendum Note (Signed)
Addended by: Philis Nettle on: 03/11/2019 10:07 AM   Modules accepted: Orders

## 2019-03-13 LAB — GYN REPORT

## 2019-03-13 LAB — SPECIMEN STATUS REPORT

## 2019-03-14 ENCOUNTER — Encounter: Payer: Self-pay | Admitting: *Deleted

## 2019-03-14 ENCOUNTER — Encounter: Payer: Self-pay | Admitting: General Surgery

## 2019-03-14 ENCOUNTER — Ambulatory Visit: Payer: Managed Care, Other (non HMO) | Admitting: General Surgery

## 2019-03-14 ENCOUNTER — Other Ambulatory Visit: Payer: Self-pay

## 2019-03-14 VITALS — BP 121/85 | HR 91 | Temp 98.1°F | Ht 61.0 in | Wt 150.0 lb

## 2019-03-14 DIAGNOSIS — K642 Third degree hemorrhoids: Secondary | ICD-10-CM | POA: Diagnosis not present

## 2019-03-14 NOTE — Progress Notes (Signed)
Patient's surgery to be scheduled for 04-05-19 at [facility] with Dr. Celine Ahr.   Patient may get in the pool one week after surgery per Dr. Celine Ahr.   The patient is aware to have COVID-19 testing done on 04-05-19 at the [facility] drive thru ([address] [place]) between 8:00 am and 10:30 am. *Will check with Pre-admit and see if this can be completed at [facility] instead per patient request. She is aware to isolate after, have no visitors, wash hands frequently, and avoid touching face.   The patient is aware she will be contacted by the [facility] to complete a phone interview sometime in the near future.  Patient aware to be NPO after midnight and have a driver.   She is aware to check in at the [facility] entrance where he/she will be screened for the coronavirus and then sent to [facility] Day Surgery.   Patient aware that she may have one visitor due to COVID-19 restrictions.   The patient verbalizes understanding of the above.   The patient is aware to call the office should she have further questions.

## 2019-03-14 NOTE — Patient Instructions (Addendum)
The patient is aware to call back for any questions or new concerns.  Schedule surgery  Hemorrhoids Hemorrhoids are swollen veins in and around the rectum or anus. There are two types of hemorrhoids:  Internal hemorrhoids. These occur in the veins that are just inside the rectum. They may poke through to the outside and become irritated and painful.  External hemorrhoids. These occur in the veins that are outside the anus and can be felt as a painful swelling or hard lump near the anus. Most hemorrhoids do not cause serious problems, and they can be managed with home treatments such as diet and lifestyle changes. If home treatments do not help the symptoms, procedures can be done to shrink or remove the hemorrhoids. What are the causes? This condition is caused by increased pressure in the anal area. This pressure may result from various things, including:  Constipation.  Straining to have a bowel movement.  Diarrhea.  Pregnancy.  Obesity.  Sitting for long periods of time.  Heavy lifting or other activity that causes you to strain.  Anal sex.  Riding a bike for a long period of time. What are the signs or symptoms? Symptoms of this condition include:  Pain.  Anal itching or irritation.  Rectal bleeding.  Leakage of stool (feces).  Anal swelling.  One or more lumps around the anus. How is this diagnosed? This condition can often be diagnosed through a visual exam. Other exams or tests may also be done, such as:  An exam that involves feeling the rectal area with a gloved hand (digital rectal exam).  An exam of the anal canal that is done using a small tube (anoscope).  A blood test, if you have lost a significant amount of blood.  A test to look inside the colon using a flexible tube with a camera on the end (sigmoidoscopy or colonoscopy). How is this treated? This condition can usually be treated at home. However, various procedures may be done if dietary  changes, lifestyle changes, and other home treatments do not help your symptoms. These procedures can help make the hemorrhoids smaller or remove them completely. Some of these procedures involve surgery, and others do not. Common procedures include:  Rubber band ligation. Rubber bands are placed at the base of the hemorrhoids to cut off their blood supply.  Sclerotherapy. Medicine is injected into the hemorrhoids to shrink them.  Infrared coagulation. A type of light energy is used to get rid of the hemorrhoids.  Hemorrhoidectomy surgery. The hemorrhoids are surgically removed, and the veins that supply them are tied off.  Stapled hemorrhoidopexy surgery. The surgeon staples the base of the hemorrhoid to the rectal wall. Follow these instructions at home: Eating and drinking   Eat foods that have a lot of fiber in them, such as whole grains, beans, nuts, fruits, and vegetables.  Ask your health care provider about taking products that have added fiber (fiber supplements).  Reduce the amount of fat in your diet. You can do this by eating low-fat dairy products, eating less red meat, and avoiding processed foods.  Drink enough fluid to keep your urine pale yellow. Managing pain and swelling   Take warm sitz baths for 20 minutes, 3-4 times a day to ease pain and discomfort. You may do this in a bathtub or using a portable sitz bath that fits over the toilet.  If directed, apply ice to the affected area. Using ice packs between sitz baths may be helpful. ? Put  ice in a plastic bag. ? Place a towel between your skin and the bag. ? Leave the ice on for 20 minutes, 2-3 times a day. General instructions  Take over-the-counter and prescription medicines only as told by your health care provider.  Use medicated creams or suppositories as told.  Get regular exercise. Ask your health care provider how much and what kind of exercise is best for you. In general, you should do moderate  exercise for at least 30 minutes on most days of the week (150 minutes each week). This can include activities such as walking, biking, or yoga.  Go to the bathroom when you have the urge to have a bowel movement. Do not wait.  Avoid straining to have bowel movements.  Keep the anal area dry and clean. Use wet toilet paper or moist towelettes after a bowel movement.  Do not sit on the toilet for long periods of time. This increases blood pooling and pain.  Keep all follow-up visits as told by your health care provider. This is important. Contact a health care provider if you have:  Increasing pain and swelling that are not controlled by treatment or medicine.  Difficulty having a bowel movement, or you are unable to have a bowel movement.  Pain or inflammation outside the area of the hemorrhoids. Get help right away if you have:  Uncontrolled bleeding from your rectum. Summary  Hemorrhoids are swollen veins in and around the rectum or anus.  Most hemorrhoids can be managed with home treatments such as diet and lifestyle changes.  Taking warm sitz baths can help ease pain and discomfort.  In severe cases, procedures or surgery can be done to shrink or remove the hemorrhoids. This information is not intended to replace advice given to you by your health care provider. Make sure you discuss any questions you have with your health care provider. Document Released: 07/22/2000 Document Revised: 08/02/2018 Document Reviewed: 12/14/2017 Elsevier Patient Education  2020 [employer] Inc[employer]  Surgical Procedures for Hemorrhoids, Care After This sheet gives you information about how to care for yourself after your procedure. Your health care provider may also give you more specific instructions. If you have problems or questions, contact your health care provider. What can I expect after the procedure? After the procedure, it is common to have:  Rectal pain.  Pain when you are having a bowel  movement.  Slight rectal bleeding. This is more likely to happen with the first bowel movement after surgery. Follow these instructions at home: Medicines  Take over-the-counter and prescription medicines only as told by your health care provider.  If you were prescribed an antibiotic medicine, use it as told by your health care provider. Do not stop using the antibiotic even if your condition improves.  Ask your health care provider if the medicine prescribed to you requires you to avoid driving or using heavy machinery.  Use a stool softener or a bulk laxative as told by your health care provider. Eating and drinking  Follow instructions from your health care provider about what to eat or drink after your procedure.  You may need to take actions to prevent or treat constipation, such as: ? Drink enough fluid to keep your urine pale yellow. ? Take over-the-counter or prescription medicines. ? Eat foods that are high in fiber, such as beans, whole grains, and fresh fruits and vegetables. ? Limit foods that are high in fat and processed sugars, such as fried or sweet foods. Activity  Rest as told by your health care provider.  Avoid sitting for a long time without moving. Get up to take short walks every 1-2 hours. This is important to improve blood flow and breathing. Ask for help if you feel weak or unsteady.  Return to your normal activities as told by your health care provider. Ask your health care provider what activities are safe for you.  Do not lift anything that is heavier than 10 lb (4.5 kg), or the limit that you are told, until your health care provider says that it is safe.  Do not strain to have a bowel movement.  Do not spend a long time sitting on the toilet. General instructions   Take warm sitz baths for 15-20 minutes, 2-3 times a day to relieve soreness or itching and to keep the rectal area clean.  Apply ice packs to the area to reduce swelling and  pain.  Do not drive for 24 hours if you were given a sedative during your procedure.  Keep all follow-up visits as told by your health care provider. This is important. Contact a health care provider if:  Your pain medicine is not helping.  You have a fever or chills.  You have bad smelling drainage.  You have a lot of swelling.  You become constipated.  You have trouble passing urine. Get help right away if:  You have very bad rectal pain.  You have heavy bleeding from your rectum. Summary  After the procedure, it is common to have pain and slight rectal bleeding.  Take warm sitz baths for 15-20 minutes, 2-3 times a day to relieve soreness or itching and to keep the rectal area clean.  Avoid straining when having a bowel movement.  Eat foods that are high in fiber, such as beans, whole grains, and fresh fruits and vegetables.  Take over-the-counter and prescription medicines only as told by your health care provider. This information is not intended to replace advice given to you by your health care provider. Make sure you discuss any questions you have with your health care provider. Document Released: 10/15/2003 Document Revised: 06/12/2018 Document Reviewed: 06/12/2018 Elsevier Patient Education  2020 [employer]

## 2019-03-15 ENCOUNTER — Other Ambulatory Visit: Payer: Self-pay

## 2019-03-15 ENCOUNTER — Ambulatory Visit
Admission: RE | Admit: 2019-03-15 | Discharge: 2019-03-15 | Disposition: A | Discharge status: Home / Self Care | Payer: Managed Care, Other (non HMO) | Source: Ambulatory Visit | Attending: Family Medicine | Admitting: Family Medicine

## 2019-03-15 ENCOUNTER — Encounter: Payer: Self-pay | Admitting: Family Medicine

## 2019-03-15 DIAGNOSIS — N2889 Other specified disorders of kidney and ureter: Secondary | ICD-10-CM | POA: Diagnosis not present

## 2019-03-15 DIAGNOSIS — N281 Cyst of kidney, acquired: Secondary | ICD-10-CM | POA: Diagnosis present

## 2019-03-15 MED ORDER — GADOBUTROL 1 MMOL/ML IV SOLN
6.0000 mL | Freq: Once | INTRAVENOUS | Status: AC | PRN
  Administered 2019-03-15: 6 mL via INTRAVENOUS

## 2019-03-15 NOTE — Progress Notes (Signed)
Patient ID: Rebecca Davies, female   DOB: Dec 10, 1985, 33 y.o.   MRN: [id number]  Chief Complaint  Patient presents with  . New Patient (Initial Visit)    new pt ref Philis Nettle np hemorrhoids    HPI Rebecca Davies is a 33 y.o. female.  She has been referred for surgical evaluation of hemorrhoids.  She states that she has had hemorrhoids since at least her last pregnancy which was 10 years ago.  They prolapse with bowel movements and occasionally she notices some bleeding.  She has been taking stool softeners and Metamucil, however there is not been any significant improvement.  She states that she has difficulty getting herself clean after having a bowel movement, secondary to the hemorrhoids and associated skin tags.  She is used a number of over-the-counter agents including Tucks pads and Preparation H, both ointment and suppositories.  She is interested in surgical removal.   Past Medical History:  Diagnosis Date  . Anxiety   . Chicken pox   . Depression   . Migraine     Past Surgical History:  Procedure Laterality Date  . [place] EXTRACTION  2013?    Family History  Problem Relation Age of Onset  . Cancer Mother        ovarian  . Hyperlipidemia Mother   . Mental illness Mother        anxiety and depression  . Alcohol abuse Father   . Hypertension Maternal Grandmother   . Heart disease Maternal Grandmother   . Stroke Maternal Grandmother   . Mental illness Maternal Grandmother   . Alcohol abuse Paternal Grandfather   . Cancer Paternal Grandfather   . Breast cancer Paternal Grandmother   . Breast cancer Paternal Great-grandmother   . Breast cancer Paternal Aunt     Social History Social History   Tobacco Use  . Smoking status: Never Smoker  . Smokeless tobacco: Never Used  Substance Use Topics  . Alcohol use: Yes    Alcohol/week: 1.0 standard drinks    Types: 1 Standard drinks or equivalent per week    Comment: 1 glass of wine every few weeks.   . Drug use: No     Allergies  Allergen Reactions  . Iodine Other (See Comments)    Blisters  . Shellfish Allergy Swelling and Rash    Current Outpatient Medications  Medication Sig Dispense Refill  . ALPRAZolam (XANAX) 0.5 MG tablet Take 1 tablet (0.5 mg total) by mouth at bedtime as needed for anxiety. 30 tablet 1  . docusate sodium (COLACE) 100 MG capsule Take 1 capsule (100 mg total) by mouth daily. 30 capsule 2  . hydrocortisone (PROCTO-MED HC) 2.5 % rectal cream Place 1 application rectally 2 (two) times daily. 30 g 0  . psyllium (METAMUCIL SMOOTH TEXTURE) 58.6 % powder Take 1 packet by mouth daily. 283 g 2  . ondansetron (ZOFRAN ODT) 4 MG disintegrating tablet Take 1 tablet (4 mg total) by mouth every 8 (eight) hours as needed for nausea or vomiting. (Patient not taking: Reported on 03/14/2019) 20 tablet 2   Current Facility-Administered Medications  Medication Dose Route Frequency Provider Last Rate Last Dose  . etonogestrel (NEXPLANON) implant 68 mg  68 mg Subdermal Once Dalia Heading, CNM        Review of Systems Review of Systems  All other systems reviewed and are negative.   Blood pressure 121/85, pulse 91, temperature 98.1 F (36.7 C), temperature source Temporal, height 5\' 1"  (1.549 m), weight  150 lb (68 kg), SpO2 97 %.  Physical Exam Physical Exam Exam conducted with a chaperone present.  Constitutional:      General: She is not in acute distress.    Appearance: Normal appearance.  HENT:     Head: Normocephalic and atraumatic.     Nose:     Comments: Covered with a mask secondary to COVID-19 precautions    Mouth/Throat:     Comments: Covered with a mask secondary to COVID-19 precautions Eyes:     General: No scleral icterus.       Right eye: No discharge.        Left eye: No discharge.     Extraocular Movements: Extraocular movements intact.     Conjunctiva/sclera: Conjunctivae normal.  Neck:     Musculoskeletal: Normal range of motion.     Comments: No  thyromegaly or thyroid masses appreciated. Cardiovascular:     Rate and Rhythm: Normal rate and regular rhythm.     Pulses: Normal pulses.     Heart sounds: No murmur.  Pulmonary:     Effort: Pulmonary effort is normal.     Breath sounds: Normal breath sounds.  Abdominal:     General: Abdomen is flat. Bowel sounds are normal.     Palpations: Abdomen is soft.  Genitourinary:    Exam position: Knee-chest position.       Comments: There are external hemorrhoids present.  There are also skin tags from prior episodes of hemorrhoids.  There are no stigmata of bleeding.  Digital rectal exam demonstrates no blood, no fluctuance or masses. Musculoskeletal: Normal range of motion.     Right lower leg: No edema.     Left lower leg: No edema.  Lymphadenopathy:     Cervical: No cervical adenopathy.  Skin:    General: Skin is warm and dry.  Neurological:     General: No focal deficit present.     Mental Status: She is alert and oriented to person, place, and time.  Psychiatric:        Mood and Affect: Mood normal.        Behavior: Behavior normal.     Data Reviewed There are no relevant data available for review.  Assessment This is a 33 year old woman who has symptomatic hemorrhoids.  She has tried a number of noninvasive approaches to managing her discomfort and occasional bleeding.  She is also concerned about hygiene secondary to skin tags.  She desires surgical removal.  Plan Today we discussed the process of hemorrhoidectomy and excision of anal skin tags.  I cautioned her that these procedures can be quite painful.  We discussed postoperative care including use of sitz baths and avoiding constipation.  She feels that the potential benefit to her out raise the concern regarding postsurgical pain and she would like to proceed.  We will schedule her for surgery.    Duanne GuessJennifer Oriyah Lamphear 03/15/2019, 11:44 AM

## 2019-03-15 NOTE — H&P (View-Only) (Signed)
Patient ID: Rebecca Davies, female   DOB: Dec 10, 1985, 33 y.o.   MRN: [id number]  Chief Complaint  Patient presents with  . New Patient (Initial Visit)    new pt ref Philis Nettle np hemorrhoids    HPI Rebecca Davies is a 33 y.o. female.  She has been referred for surgical evaluation of hemorrhoids.  She states that she has had hemorrhoids since at least her last pregnancy which was 10 years ago.  They prolapse with bowel movements and occasionally she notices some bleeding.  She has been taking stool softeners and Metamucil, however there is not been any significant improvement.  She states that she has difficulty getting herself clean after having a bowel movement, secondary to the hemorrhoids and associated skin tags.  She is used a number of over-the-counter agents including Tucks pads and Preparation H, both ointment and suppositories.  She is interested in surgical removal.   Past Medical History:  Diagnosis Date  . Anxiety   . Chicken pox   . Depression   . Migraine     Past Surgical History:  Procedure Laterality Date  . [place] EXTRACTION  2013?    Family History  Problem Relation Age of Onset  . Cancer Mother        ovarian  . Hyperlipidemia Mother   . Mental illness Mother        anxiety and depression  . Alcohol abuse Father   . Hypertension Maternal Grandmother   . Heart disease Maternal Grandmother   . Stroke Maternal Grandmother   . Mental illness Maternal Grandmother   . Alcohol abuse Paternal Grandfather   . Cancer Paternal Grandfather   . Breast cancer Paternal Grandmother   . Breast cancer Paternal Great-grandmother   . Breast cancer Paternal Aunt     Social History Social History   Tobacco Use  . Smoking status: Never Smoker  . Smokeless tobacco: Never Used  Substance Use Topics  . Alcohol use: Yes    Alcohol/week: 1.0 standard drinks    Types: 1 Standard drinks or equivalent per week    Comment: 1 glass of wine every few weeks.   . Drug use: No     Allergies  Allergen Reactions  . Iodine Other (See Comments)    Blisters  . Shellfish Allergy Swelling and Rash    Current Outpatient Medications  Medication Sig Dispense Refill  . ALPRAZolam (XANAX) 0.5 MG tablet Take 1 tablet (0.5 mg total) by mouth at bedtime as needed for anxiety. 30 tablet 1  . docusate sodium (COLACE) 100 MG capsule Take 1 capsule (100 mg total) by mouth daily. 30 capsule 2  . hydrocortisone (PROCTO-MED HC) 2.5 % rectal cream Place 1 application rectally 2 (two) times daily. 30 g 0  . psyllium (METAMUCIL SMOOTH TEXTURE) 58.6 % powder Take 1 packet by mouth daily. 283 g 2  . ondansetron (ZOFRAN ODT) 4 MG disintegrating tablet Take 1 tablet (4 mg total) by mouth every 8 (eight) hours as needed for nausea or vomiting. (Patient not taking: Reported on 03/14/2019) 20 tablet 2   Current Facility-Administered Medications  Medication Dose Route Frequency Provider Last Rate Last Dose  . etonogestrel (NEXPLANON) implant 68 mg  68 mg Subdermal Once Dalia Heading, CNM        Review of Systems Review of Systems  All other systems reviewed and are negative.   Blood pressure 121/85, pulse 91, temperature 98.1 F (36.7 C), temperature source Temporal, height 5\' 1"  (1.549 m), weight  150 lb (68 kg), SpO2 97 %.  Physical Exam Physical Exam Exam conducted with a chaperone present.  Constitutional:      General: She is not in acute distress.    Appearance: Normal appearance.  HENT:     Head: Normocephalic and atraumatic.     Nose:     Comments: Covered with a mask secondary to COVID-19 precautions    Mouth/Throat:     Comments: Covered with a mask secondary to COVID-19 precautions Eyes:     General: No scleral icterus.       Right eye: No discharge.        Left eye: No discharge.     Extraocular Movements: Extraocular movements intact.     Conjunctiva/sclera: Conjunctivae normal.  Neck:     Musculoskeletal: Normal range of motion.     Comments: No  thyromegaly or thyroid masses appreciated. Cardiovascular:     Rate and Rhythm: Normal rate and regular rhythm.     Pulses: Normal pulses.     Heart sounds: No murmur.  Pulmonary:     Effort: Pulmonary effort is normal.     Breath sounds: Normal breath sounds.  Abdominal:     General: Abdomen is flat. Bowel sounds are normal.     Palpations: Abdomen is soft.  Genitourinary:    Exam position: Knee-chest position.       Comments: There are external hemorrhoids present.  There are also skin tags from prior episodes of hemorrhoids.  There are no stigmata of bleeding.  Digital rectal exam demonstrates no blood, no fluctuance or masses. Musculoskeletal: Normal range of motion.     Right lower leg: No edema.     Left lower leg: No edema.  Lymphadenopathy:     Cervical: No cervical adenopathy.  Skin:    General: Skin is warm and dry.  Neurological:     General: No focal deficit present.     Mental Status: She is alert and oriented to person, place, and time.  Psychiatric:        Mood and Affect: Mood normal.        Behavior: Behavior normal.     Data Reviewed There are no relevant data available for review.  Assessment This is a 33-year-old woman who has symptomatic hemorrhoids.  She has tried a number of noninvasive approaches to managing her discomfort and occasional bleeding.  She is also concerned about hygiene secondary to skin tags.  She desires surgical removal.  Plan Today we discussed the process of hemorrhoidectomy and excision of anal skin tags.  I cautioned her that these procedures can be quite painful.  We discussed postoperative care including use of sitz baths and avoiding constipation.  She feels that the potential benefit to her out raise the concern regarding postsurgical pain and she would like to proceed.  We will schedule her for surgery.    Tron Flythe 03/15/2019, 11:44 AM   

## 2019-03-18 ENCOUNTER — Other Ambulatory Visit: Payer: Self-pay | Admitting: General Surgery

## 2019-03-18 DIAGNOSIS — K642 Third degree hemorrhoids: Secondary | ICD-10-CM

## 2019-03-20 ENCOUNTER — Telehealth: Payer: Self-pay | Admitting: *Deleted

## 2019-03-20 NOTE — Telephone Encounter (Signed)
Spoke with the patient today to let her know that she can go to the [facility] to have [place] testing done on 04-02-19 as previously discussed.   Patient pleased and verbalizes understanding.   Surgery scheduled with Dr. Celine Ahr for 04-05-19 at [facility].   The patient is aware to call the office should she have further questions.

## 2019-03-25 ENCOUNTER — Ambulatory Visit: Payer: Managed Care, Other (non HMO) | Admitting: Obstetrics and Gynecology

## 2019-03-25 ENCOUNTER — Encounter: Payer: Self-pay | Admitting: Family Medicine

## 2019-03-25 DIAGNOSIS — R1084 Generalized abdominal pain: Secondary | ICD-10-CM

## 2019-03-25 DIAGNOSIS — R112 Nausea with vomiting, unspecified: Secondary | ICD-10-CM

## 2019-03-27 ENCOUNTER — Encounter: Payer: Self-pay | Admitting: Gastroenterology

## 2019-03-28 ENCOUNTER — Encounter
Admission: RE | Admit: 2019-03-28 | Discharge: 2019-03-28 | Disposition: A | Discharge status: Home / Self Care | Payer: Managed Care, Other (non HMO) | Source: Ambulatory Visit | Attending: General Surgery | Admitting: General Surgery

## 2019-03-28 ENCOUNTER — Other Ambulatory Visit: Payer: Self-pay

## 2019-03-28 HISTORY — DX: Unspecified osteoarthritis, unspecified site: M19.90

## 2019-03-28 HISTORY — DX: Gastro-esophageal reflux disease without esophagitis: K21.9

## 2019-03-28 NOTE — Patient Instructions (Signed)
Your procedure is scheduled on: 04-05-19  Report to Same Day Surgery 2nd floor medical mall [facility](Medical Mall Entrance-take elevator on left to 2nd floor.  Check in with surgery information desk.) To find out your arrival time please call [phone] between 1PM - 3PM on 04-04-19   Remember: Instructions that are not followed completely may result in serious medical risk, up to and including death, or upon the discretion of your surgeon and anesthesiologist your surgery may need to be rescheduled.    _x___ 1. Do not eat food after midnight the night before your procedure. NO GUM OR CANDY AFTER MIDNIGHT.  You may drink clear liquids up to 2 hours before you are scheduled to arrive at the hospital for your procedure.  Do not drink clear liquids within 2 hours of your scheduled arrival to the hospital.  Clear liquids include  --Water or Apple juice without pulp  --Clear carbohydrate beverage such as ClearFast or Gatorade  --Black Coffee or Clear Tea (No milk, no creamers, do not add anything to the coffee or Tea   ____Ensure clear carbohydrate drink on the way to the hospital for bariatric patients  ____Ensure clear carbohydrate drink 3 hours before surgery.     __x__ 2. No Alcohol for 24 hours before or after surgery.   __x__3. No Smoking or e-cigarettes for 24 prior to surgery.  Do not use any chewable tobacco products for at least 6 hour prior to surgery   ____  4. Bring all medications with you on the day of surgery if instructed.    __x__ 5. Notify your doctor if there is any change in your medical condition     (cold, fever, infections).    x___6. On the morning of surgery brush your teeth with toothpaste and water.  You may rinse your mouth with mouth wash if you wish.  Do not swallow any toothpaste or mouthwash.   Do not wear jewelry, make-up, hairpins, clips or nail polish.  Do not wear lotions, powders, or perfumes. You may wear deodorant.  Do not shave 48 hours prior to surgery.  Men may shave face and neck.  Do not bring valuables to the hospital.    [facility] is not responsible for any belongings or valuables.               Contacts, dentures or bridgework may not be worn into surgery.  Leave your suitcase in the car. After surgery it may be brought to your room.  For patients admitted to the hospital, discharge time is determined by your treatment team.  _  Patients discharged the day of surgery will not be allowed to drive home.  You will need someone to drive you home and stay with you the night of your procedure.    Please read over the following fact sheets that you were given:   [facility] Pre[facility]   _x___ Take anti-hypertensive listed below, cardiac, seizure, asthma,     anti-reflux and psychiatric medicines. These include:  1. YOU MAY TAKE XANAX DAY OF SURGERY IF NEEDED  2.  3.  4.  5.  6.  _X___Fleets enema as directed-DO FLEET ENEMA AT HOME THE NIGHT BEFORE SURGERY AND FLEET ENEMA THE MORNING OF SURGERY 1 HOUR PRIOR TO ARRIVAL TIME   ____ Use CHG Soap or sage wipes as directed on instruction sheet   ____ Use inhalers on the day of surgery and bring to hospital day of surgery  ____ Stop Metformin  and Janumet 2 days prior to surgery.    ____ Take 1/2 of usual insulin dose the night before surgery and none on the morning surgery.   ____ Follow recommendations from Cardiologist, Pulmonologist or PCP regarding stopping Aspirin, Coumadin, Plavix ,Eliquis, Effient, or Pradaxa, and Pletal.  X____Stop Anti-inflammatories such as Advil, Aleve, Ibuprofen, Motrin, Naproxen, Naprosyn, Goodies powders or aspirin products NOW-OK to take Tylenol    _x___ Stop supplements until after surgery-STOP AZO CRANBERRY NOW-MAY RESUME AFTER SURGERY   ____ Bring C-Pap to the hospital.

## 2019-04-02 ENCOUNTER — Other Ambulatory Visit
Admission: RE | Admit: 2019-04-02 | Discharge: 2019-04-02 | Disposition: A | Discharge status: Home / Self Care | Payer: Managed Care, Other (non HMO) | Source: Ambulatory Visit | Attending: General Surgery | Admitting: General Surgery

## 2019-04-02 DIAGNOSIS — Z01812 Encounter for preprocedural laboratory examination: Secondary | ICD-10-CM | POA: Insufficient documentation

## 2019-04-02 DIAGNOSIS — Z20828 Contact with and (suspected) exposure to other viral communicable diseases: Secondary | ICD-10-CM | POA: Diagnosis not present

## 2019-04-02 LAB — SARS CORONAVIRUS 2 (TAT 6-24 HRS): SARS Coronavirus 2: NEGATIVE

## 2019-04-05 ENCOUNTER — Encounter
Admission: RE | Disposition: A | Discharge status: Home / Self Care | Payer: Self-pay | Source: Home / Self Care | Attending: General Surgery

## 2019-04-05 ENCOUNTER — Other Ambulatory Visit: Payer: Self-pay

## 2019-04-05 ENCOUNTER — Ambulatory Visit: Payer: Managed Care, Other (non HMO) | Admitting: Certified Registered Nurse Anesthetist

## 2019-04-05 ENCOUNTER — Ambulatory Visit
Admission: RE | Admit: 2019-04-05 | Discharge: 2019-04-05 | Disposition: A | Discharge status: Home / Self Care | Payer: Managed Care, Other (non HMO) | Attending: General Surgery | Admitting: General Surgery

## 2019-04-05 ENCOUNTER — Encounter: Payer: Self-pay | Admitting: *Deleted

## 2019-04-05 DIAGNOSIS — K5900 Constipation, unspecified: Secondary | ICD-10-CM

## 2019-04-05 DIAGNOSIS — K642 Third degree hemorrhoids: Secondary | ICD-10-CM | POA: Diagnosis not present

## 2019-04-05 DIAGNOSIS — K648 Other hemorrhoids: Secondary | ICD-10-CM | POA: Insufficient documentation

## 2019-04-05 DIAGNOSIS — K644 Residual hemorrhoidal skin tags: Secondary | ICD-10-CM | POA: Insufficient documentation

## 2019-04-05 DIAGNOSIS — K649 Unspecified hemorrhoids: Secondary | ICD-10-CM

## 2019-04-05 DIAGNOSIS — Z793 Long term (current) use of hormonal contraceptives: Secondary | ICD-10-CM | POA: Diagnosis not present

## 2019-04-05 DIAGNOSIS — Z888 Allergy status to other drugs, medicaments and biological substances status: Secondary | ICD-10-CM | POA: Diagnosis not present

## 2019-04-05 DIAGNOSIS — F419 Anxiety disorder, unspecified: Secondary | ICD-10-CM | POA: Insufficient documentation

## 2019-04-05 DIAGNOSIS — Z79899 Other long term (current) drug therapy: Secondary | ICD-10-CM | POA: Diagnosis not present

## 2019-04-05 DIAGNOSIS — Z91013 Allergy to seafood: Secondary | ICD-10-CM | POA: Insufficient documentation

## 2019-04-05 HISTORY — PX: HEMORRHOID SURGERY: SHX153

## 2019-04-05 LAB — POCT PREGNANCY, URINE: Preg Test, Ur: NEGATIVE

## 2019-04-05 SURGERY — HEMORRHOIDECTOMY
Anesthesia: General

## 2019-04-05 MED ORDER — LACTATED RINGERS IV SOLN
INTRAVENOUS | Status: DC
  Administered 2019-04-05: 07:00:00 via INTRAVENOUS

## 2019-04-05 MED ORDER — ACETAMINOPHEN 500 MG PO TABS
1000.0000 mg | ORAL_TABLET | ORAL | Status: AC
  Administered 2019-04-05: 06:00:00 1000 mg via ORAL

## 2019-04-05 MED ORDER — DEXAMETHASONE SODIUM PHOSPHATE 10 MG/ML IJ SOLN
INTRAMUSCULAR | Status: DC | PRN
  Administered 2019-04-05: 6 mg via INTRAVENOUS

## 2019-04-05 MED ORDER — FLEET ENEMA 7-19 GM/118ML RE ENEM: 1.0000 | ENEMA | Freq: Once | RECTAL | Status: DC

## 2019-04-05 MED ORDER — BUPIVACAINE LIPOSOME 1.3 % IJ SUSP
INTRAMUSCULAR | Status: AC
  Filled 2019-04-05: qty 20

## 2019-04-05 MED ORDER — BUPIVACAINE LIPOSOME 1.3 % IJ SUSP
INTRAMUSCULAR | Status: DC | PRN
  Administered 2019-04-05: 20 mL

## 2019-04-05 MED ORDER — IBUPROFEN 800 MG PO TABS: 800.0000 mg | ORAL_TABLET | Freq: Three times a day (TID) | ORAL | 0 refills | Status: DC | PRN

## 2019-04-05 MED ORDER — FAMOTIDINE 20 MG PO TABS
ORAL_TABLET | ORAL | Status: AC
  Administered 2019-04-05: 06:00:00 20 mg via ORAL
  Filled 2019-04-05: qty 1

## 2019-04-05 MED ORDER — PROPOFOL 10 MG/ML IV BOLUS
INTRAVENOUS | Status: DC | PRN
  Administered 2019-04-05: 140 mg via INTRAVENOUS

## 2019-04-05 MED ORDER — THROMBIN 5000 UNITS EX SOLR
CUTANEOUS | Status: AC
  Filled 2019-04-05: qty 5000

## 2019-04-05 MED ORDER — LIDOCAINE HCL (CARDIAC) PF 100 MG/5ML IV SOSY
PREFILLED_SYRINGE | INTRAVENOUS | Status: DC | PRN
  Administered 2019-04-05: 100 mg via INTRAVENOUS

## 2019-04-05 MED ORDER — OXYCODONE HCL 5 MG PO TABS: 5.0000 mg | ORAL_TABLET | ORAL | 0 refills | Status: DC | PRN

## 2019-04-05 MED ORDER — CELECOXIB 200 MG PO CAPS
ORAL_CAPSULE | ORAL | Status: AC
  Administered 2019-04-05: 06:00:00 200 mg via ORAL
  Filled 2019-04-05: qty 1

## 2019-04-05 MED ORDER — ROCURONIUM BROMIDE 100 MG/10ML IV SOLN
INTRAVENOUS | Status: DC | PRN
  Administered 2019-04-05: 30 mg via INTRAVENOUS

## 2019-04-05 MED ORDER — LIDOCAINE HCL (PF) 2 % IJ SOLN
INTRAMUSCULAR | Status: AC
  Filled 2019-04-05: qty 10

## 2019-04-05 MED ORDER — MIDAZOLAM HCL 2 MG/2ML IJ SOLN
INTRAMUSCULAR | Status: AC
  Filled 2019-04-05: qty 2

## 2019-04-05 MED ORDER — ONDANSETRON HCL 4 MG/2ML IJ SOLN
INTRAMUSCULAR | Status: DC | PRN
  Administered 2019-04-05: 4 mg via INTRAVENOUS

## 2019-04-05 MED ORDER — DOCUSATE SODIUM 100 MG PO CAPS: 100.0000 mg | ORAL_CAPSULE | Freq: Two times a day (BID) | ORAL | 2 refills | Status: DC

## 2019-04-05 MED ORDER — FENTANYL CITRATE (PF) 100 MCG/2ML IJ SOLN
INTRAMUSCULAR | Status: DC | PRN
  Administered 2019-04-05 (×2): 50 ug via INTRAVENOUS

## 2019-04-05 MED ORDER — FENTANYL CITRATE (PF) 100 MCG/2ML IJ SOLN
INTRAMUSCULAR | Status: AC
  Filled 2019-04-05: qty 2

## 2019-04-05 MED ORDER — SUGAMMADEX SODIUM 200 MG/2ML IV SOLN
INTRAVENOUS | Status: DC | PRN
  Administered 2019-04-05: 150 mg via INTRAVENOUS

## 2019-04-05 MED ORDER — LIDOCAINE HCL (PF) 1 % IJ SOLN
INTRAMUSCULAR | Status: AC
  Filled 2019-04-05: qty 30

## 2019-04-05 MED ORDER — FAMOTIDINE 20 MG PO TABS
20.0000 mg | ORAL_TABLET | Freq: Once | ORAL | Status: AC
  Administered 2019-04-05: 06:00:00 20 mg via ORAL

## 2019-04-05 MED ORDER — LIDOCAINE HCL 1 % IJ SOLN
INTRAMUSCULAR | Status: DC | PRN
  Administered 2019-04-05: 10 mL

## 2019-04-05 MED ORDER — GABAPENTIN 300 MG PO CAPS
300.0000 mg | ORAL_CAPSULE | ORAL | Status: AC
  Administered 2019-04-05: 06:00:00 300 mg via ORAL

## 2019-04-05 MED ORDER — PROPOFOL 10 MG/ML IV BOLUS
INTRAVENOUS | Status: AC
  Filled 2019-04-05: qty 20

## 2019-04-05 MED ORDER — BUPIVACAINE LIPOSOME 1.3 % IJ SUSP: 20.0000 mL | Freq: Once | INTRAMUSCULAR | Status: DC

## 2019-04-05 MED ORDER — CHLORHEXIDINE GLUCONATE CLOTH 2 % EX PADS: 6.0000 | MEDICATED_PAD | Freq: Once | CUTANEOUS | Status: DC

## 2019-04-05 MED ORDER — ACETAMINOPHEN 500 MG PO TABS
ORAL_TABLET | ORAL | Status: AC
  Administered 2019-04-05: 1000 mg via ORAL
  Filled 2019-04-05: qty 2

## 2019-04-05 MED ORDER — CELECOXIB 200 MG PO CAPS
200.0000 mg | ORAL_CAPSULE | ORAL | Status: AC
  Administered 2019-04-05: 06:00:00 200 mg via ORAL

## 2019-04-05 MED ORDER — GABAPENTIN 300 MG PO CAPS
ORAL_CAPSULE | ORAL | Status: AC
  Administered 2019-04-05: 300 mg via ORAL
  Filled 2019-04-05: qty 1

## 2019-04-05 MED ORDER — GELATIN ABSORBABLE 100 CM EX MISC
CUTANEOUS | Status: AC
  Filled 2019-04-05: qty 1

## 2019-04-05 MED ORDER — ROCURONIUM BROMIDE 50 MG/5ML IV SOLN
INTRAVENOUS | Status: AC
  Filled 2019-04-05: qty 1

## 2019-04-05 MED ORDER — MIDAZOLAM HCL 2 MG/2ML IJ SOLN
INTRAMUSCULAR | Status: DC | PRN
  Administered 2019-04-05: 2 mg via INTRAVENOUS

## 2019-04-05 SURGICAL SUPPLY — 32 items
"PENCIL ELECTRO HAND CTR " (MISCELLANEOUS) IMPLANT
BLADE SURG 15 STRL LF DISP TIS (BLADE) ×1 IMPLANT
BLADE SURG 15 STRL SS (BLADE) ×1
BRIEF STRETCH MATERNITY 2XLG (MISCELLANEOUS) ×2 IMPLANT
CANISTER SUCT 1200ML W/VALVE (MISCELLANEOUS) ×2 IMPLANT
COVER WAND RF STERILE (DRAPES) ×1 IMPLANT
DRAPE LAPAROTOMY 100X77 ABD (DRAPES) ×2 IMPLANT
DRSG GAUZE FLUFF 36X18 (GAUZE/BANDAGES/DRESSINGS) ×1 IMPLANT
ELECT CAUTERY BLADE TIP 2.5 (TIP) ×4
ELECT REM PT RETURN 9FT ADLT (ELECTROSURGICAL) ×2
ELECTRODE CAUTERY BLDE TIP 2.5 (TIP) ×1 IMPLANT
ELECTRODE REM PT RTRN 9FT ADLT (ELECTROSURGICAL) ×1 IMPLANT
GLOVE BIO SURGEON STRL SZ 6.5 (GLOVE) ×3 IMPLANT
GLOVE INDICATOR 7.0 STRL GRN (GLOVE) ×3 IMPLANT
GOWN STRL REUS W/ TWL LRG LVL3 (GOWN DISPOSABLE) ×2 IMPLANT
GOWN STRL REUS W/TWL LRG LVL3 (GOWN DISPOSABLE) ×2
KIT TURNOVER KIT A (KITS) ×2 IMPLANT
LABEL OR SOLS (LABEL) ×2 IMPLANT
NDL HYPO 25X1 1.5 SAFETY (NEEDLE) ×1 IMPLANT
NEEDLE HYPO 25X1 1.5 SAFETY (NEEDLE) ×2 IMPLANT
PACK BASIN MINOR ARMC (MISCELLANEOUS) ×2 IMPLANT
PENCIL ELECTRO HAND CTR (MISCELLANEOUS) ×1 IMPLANT
SHEARS HARMONIC 9CM CVD (BLADE) ×2 IMPLANT
SOL PREP PVP 2OZ (MISCELLANEOUS) ×2
SOLUTION PREP PVP 2OZ (MISCELLANEOUS) ×1 IMPLANT
SPONGE LAP 18X18 RF (DISPOSABLE) ×2 IMPLANT
SURGILUBE 2OZ TUBE FLIPTOP (MISCELLANEOUS) ×2 IMPLANT
SUT SILK 0 CT 1 30 (SUTURE) ×2 IMPLANT
SUT VIC AB 3-0 SH 27 (SUTURE)
SUT VIC AB 3-0 SH 27X BRD (SUTURE) IMPLANT
SYR 10ML LL (SYRINGE) ×2 IMPLANT
SYR BULB IRRIG 60ML STRL (SYRINGE) ×2 IMPLANT

## 2019-04-05 NOTE — Anesthesia Postprocedure Evaluation (Signed)
Anesthesia Post Note  Patient: Rebecca Davies  Procedure(s) Performed: HEMORRHOIDECTOMY, EXCISION ANAL SKIN TAG (N/A )  Patient location during evaluation: PACU Anesthesia Type: General Level of consciousness: awake and alert Pain management: pain level controlled Vital Signs Assessment: post-procedure vital signs reviewed and stable Respiratory status: spontaneous breathing, nonlabored ventilation, respiratory function stable and patient connected to nasal cannula oxygen Cardiovascular status: blood pressure returned to baseline and stable Postop Assessment: no apparent nausea or vomiting Anesthetic complications: no     Last Vitals:  Vitals:   04/05/19 1131 04/05/19 1156  BP: 120/83 137/73  Pulse: 91 86  Resp: 16 16  Temp:  36.5 C  SpO2: 98% 100%    Last Pain:  Vitals:   04/05/19 1156  TempSrc: Temporal  PainSc: 0-No pain                 Martha Clan

## 2019-04-05 NOTE — Anesthesia Preprocedure Evaluation (Signed)
Anesthesia Evaluation  Patient identified by MRN, date of birth, ID band Patient awake    Reviewed: Allergy & Precautions, H&P , NPO status , Patient's Chart, lab work & pertinent test results, reviewed documented beta blocker date and time   History of Anesthesia Complications Negative for: history of anesthetic complications  Airway Mallampati: II  TM Distance: >3 FB Neck ROM: full    Dental  (+) Caps, Dental Advidsory Given, Teeth Intact, Missing   Pulmonary neg pulmonary ROS,    Pulmonary exam normal        Cardiovascular Exercise Tolerance: Good negative cardio ROS Normal cardiovascular exam     Neuro/Psych negative neurological ROS  negative psych ROS   GI/Hepatic Neg liver ROS, GERD  ,  Endo/Other  negative endocrine ROS  Renal/GU negative Renal ROS  negative genitourinary   Musculoskeletal   Abdominal   Peds  Hematology negative hematology ROS (+)   Anesthesia Other Findings Past Medical History: No date: Anxiety No date: Arthritis     Comment:  FINGER, ELBOW No date: Chicken pox No date: Depression No date: GERD (gastroesophageal reflux disease)     Comment:  OCC- NO MEDS No date: Migraine     Comment:  H/O MIGRAINES   Reproductive/Obstetrics negative OB ROS                             Anesthesia Physical Anesthesia Plan  ASA: II  Anesthesia Plan: General   Post-op Pain Management:    Induction: Intravenous  PONV Risk Score and Plan:   Airway Management Planned: LMA and Oral ETT  Additional Equipment:   Intra-op Plan:   Post-operative Plan: Extubation in OR  Informed Consent: I have reviewed the patients History and Physical, chart, labs and discussed the procedure including the risks, benefits and alternatives for the proposed anesthesia with the patient or authorized representative who has indicated his/her understanding and acceptance.     Dental  Advisory Given  Plan Discussed with: Anesthesiologist, CRNA and Surgeon  Anesthesia Plan Comments:         Anesthesia Quick Evaluation

## 2019-04-05 NOTE — Transfer of Care (Signed)
Immediate Anesthesia Transfer of Care Note  Patient: Rebecca Davies  Procedure(s) Performed: HEMORRHOIDECTOMY, EXCISION ANAL SKIN TAG (N/A )  Patient Location: PACU  Anesthesia Type:General  Level of Consciousness: awake, alert  and oriented  Airway & Oxygen Therapy: Patient Spontanous Breathing and Patient connected to face mask oxygen  Post-op Assessment: Report given to RN and Post -op Vital signs reviewed and stable  Post vital signs: Reviewed and stable  Last Vitals:  Vitals Value Taken Time  BP 110/78 04/05/19 0830  Temp 36.6 C 04/05/19 0830  Pulse 94 04/05/19 0831  Resp 14 04/05/19 0831  SpO2 100 % 04/05/19 0831  Vitals shown include unvalidated device data.  Last Pain:  Vitals:   04/05/19 0621  TempSrc: Tympanic  PainSc: 0-No pain         Complications: No apparent anesthesia complications

## 2019-04-05 NOTE — Op Note (Signed)
Operative Note  Preoperative Diagnosis: 1. anal skin tag              2. internal and external hemorrhoids  Postoperative Diagnosis: Same  Operation: Excision of a 1.5 cm anal skin tag and excision of 2 columns of both internal and external hemorrhoids  Surgeon: Fredirick Maudlin, MD  Assistant: None  Anesthesia: General endotracheal  Findings: All 3 columns of hemorrhoidal veins were dilated.  The skin tag was associated with the right lateral column and the anterior column had stigmata of more recent bleeding.  These were the ones selected for excision, based upon these findings.  Indications: Rebecca Davies is a 33 year old woman who has a history of hemorrhoids.  She also has a skin tag that bothers her regarding hygiene.  She has had discomfort and some minor bleeding from her hemorrhoids and desires surgical excision.  Procedure In Detail: The patient was identified in the preoperative holding area and brought to the operating room where she was intubated on the stretcher.  She was turned into the prone position on the OR table.  Care was taken to appropriately pad all bony prominences and support her chest.  The table was flexed into a jackknife position.  The buttocks were taped laterally to expose the anus.  She was then sterilely prepped and draped in standard fashion.  A timeout was performed confirming her identity, the procedure being performed, her allergies, all necessary equipment was available, and that maintenance anesthesia was adequate.  The skin and subcutaneous tissue surrounding the skin tag and the selected hemorrhoidal columns was infiltrated with 1% plain lidocaine.  The skin was then incised sharply around the skin tag.  Combination of electrocautery and harmonic scalpel was then utilized to lift the hemorrhoidal column off of the underlying musculature, taking care to remain within the mucosal plane.  The pedicle was divided with the harmonic scalpel and the colon was  handed off as a specimen.  This procedure was repeated with the anterior column, again taking care to avoid violation of the perineal body.  Once the hemorrhoidal columns were removed, electrocautery was used to obtain good hemostasis.  I then infiltrated 20 cc of liposomal bupivacaine throughout the tissues that had been operated upon.  Gauze fluffs and mesh panties were then placed.  The patient was turned supine onto the stretcher and safely extubated.  She was taken to the postanesthesia care unit in good condition.  EBL: Less than 5 cc  IVF: Please see anesthesia record  Specimen(s): Anal skin tag adherent to right lateral column hemorrhoids and anterior column hemorrhoids all sent to pathology  Complications: none immediately apparent.   Counts: all needles, instruments, and sponges were counted and reported to be correct in number at the end of the case.   I was present for and participated in the entire operation.  Fredirick Maudlin 8:35 AM

## 2019-04-05 NOTE — Anesthesia Post-op Follow-up Note (Signed)
Anesthesia QCDR form completed.        

## 2019-04-05 NOTE — Anesthesia Procedure Notes (Signed)
Procedure Name: Intubation Date/Time: 04/05/2019 7:36 AM Performed by: Babs Sciara, CRNA Pre-anesthesia Checklist: Patient identified, Emergency Drugs available, Suction available, Patient being monitored and Timeout performed Patient Re-evaluated:Patient Re-evaluated prior to induction Oxygen Delivery Method: Circle system utilized Preoxygenation: Pre-oxygenation with 100% oxygen Induction Type: IV induction Ventilation: Mask ventilation without difficulty Laryngoscope Size: Mac and 3 Grade View: Grade I Tube type: Oral Tube size: 7.0 mm Number of attempts: 1 Airway Equipment and Method: Stylet Placement Confirmation: ETT inserted through vocal cords under direct vision,  positive ETCO2 and breath sounds checked- equal and bilateral Secured at: 20 (lip) cm Tube secured with: Tape Dental Injury: Teeth and Oropharynx as per pre-operative assessment

## 2019-04-05 NOTE — Telephone Encounter (Signed)
err

## 2019-04-05 NOTE — Interval H&P Note (Signed)
History and Physical Interval Note:  04/05/2019 7:20 AM  Rebecca Davies  has presented today for surgery, with the diagnosis of K64.8 HEMORRHOIDS K64.4 ANAL SKIN TAG.  The various methods of treatment have been discussed with the patient and family. After consideration of risks, benefits and other options for treatment, the patient has consented to  Procedure(s): HEMORRHOIDECTOMY, EXCISION ANAL SKIN TAG (N/A) as a surgical intervention.  The patient's history has been reviewed, patient examined, no change in status, stable for surgery.  I have reviewed the patient's chart and labs.  Questions were answered to the patient's satisfaction.     Fredirick Maudlin

## 2019-04-05 NOTE — Discharge Instructions (Signed)

## 2019-04-06 ENCOUNTER — Encounter: Payer: Self-pay | Admitting: General Surgery

## 2019-04-08 LAB — SURGICAL PATHOLOGY

## 2019-04-12 ENCOUNTER — Other Ambulatory Visit: Payer: Self-pay

## 2019-04-12 ENCOUNTER — Telehealth: Payer: Self-pay

## 2019-04-12 DIAGNOSIS — M62838 Other muscle spasm: Secondary | ICD-10-CM

## 2019-04-12 MED ORDER — CYCLOBENZAPRINE HCL 5 MG PO TABS: 5.0000 mg | ORAL_TABLET | Freq: Three times a day (TID) | ORAL | 0 refills | Status: DC | PRN

## 2019-04-12 NOTE — Telephone Encounter (Signed)
Pt called [facility] for CRM: Patient called in stating she had a surgery last Friday. Patient did not realize surgeons office closed early and is wondering if PCP can call in muscle relaxer. Patient stating it is making it hard to walk. Please advise and pharmacy is [facility] pharmacy [address], [place] [address]  (phone: [phone]).

## 2019-04-12 NOTE — Addendum Note (Signed)
Addended by: Philis Nettle on: 04/12/2019 04:20 PM   Modules accepted: Orders

## 2019-04-12 NOTE — Telephone Encounter (Signed)
Hemorrhoidectomy 04/05/2019 Patient states she has passed a few clots and wanted to know if this is normal. She was instructed to continue sitz baths, making sure to clean well after bowel movements. Avoid constipation. She stated she is taking stool softners, metamucil as advised.  She stated she was having some anal discomfort and was advised to continue pain medications and to call back if no better.

## 2019-04-12 NOTE — Telephone Encounter (Signed)
Copied from [place] [id number]. Topic: General - Inquiry >> Apr 12, 2019  3:09 PM Richardo Priest, [place] wrote: Reason for CRM: Patient called in stating she had a surgery last Friday. Patient did not realize surgeons office closed early and is wondering if PCP can call in muscle relaxer. Patient stating it is making it hard to walk. Please advise and pharmacy is [facility] pharmacy [address] [place], [place] [address]  (phone: [phone]).

## 2019-04-25 ENCOUNTER — Ambulatory Visit (INDEPENDENT_AMBULATORY_CARE_PROVIDER_SITE_OTHER): Payer: Managed Care, Other (non HMO) | Admitting: General Surgery

## 2019-04-25 ENCOUNTER — Other Ambulatory Visit: Payer: Self-pay

## 2019-04-25 ENCOUNTER — Encounter: Payer: Self-pay | Admitting: General Surgery

## 2019-04-25 VITALS — BP 125/86 | HR 99 | Temp 97.7°F | Wt 145.2 lb

## 2019-04-25 DIAGNOSIS — Z9889 Other specified postprocedural states: Secondary | ICD-10-CM

## 2019-04-25 DIAGNOSIS — Z8719 Personal history of other diseases of the digestive system: Secondary | ICD-10-CM

## 2019-04-25 NOTE — Patient Instructions (Signed)
May use hemorrhoid cream if needed.  Follow-up with our office as needed.  Please call and ask to speak with a nurse if you develop questions or concerns.  Call us if you decide to have your other hemorrhoid removed.

## 2019-04-25 NOTE — Progress Notes (Signed)
Rebecca Davies is here today for a postoperative visit after undergoing a hemorrhoidectomy on April 05, 2019.  She says that she has gotten past the worst of the pain and is now doing much better.  She is no longer requiring narcotics.  She continues to have discomfort with bowel movements but states that she is avoiding constipation.  She still has a small amount of yellowish drainage.  2 columns of hemorrhoids were resected and she still has an anterior column in situ, which she says is still bothering her somewhat.  She denies any fevers or chills.  No nausea or vomiting.  Her appetite is normal and she has resumed most of her usual activities.  Today's Vitals   04/25/19 0918  BP: 125/86  Pulse: 99  Temp: 97.7 F (36.5 C)  SpO2: 97%  Weight: 145 lb 3.2 oz (65.9 kg)   Body mass index is 27.44 kg/m.  Focused examination: The area where 2 columns of hemorrhoidal tissue were removed are healing nicely.  There is good granulation tissue present.  No purulent drainage.  The anterior column hemorrhoidal tissue does not appear inflamed at this time.  Impression and plan: Rebecca Davies is a 33 year old woman who underwent 2 column internal and external hemorrhoidectomy in August.  She is doing well from her surgery.  The anterior column is somewhat irritated.  She asked if she could use topical over-the-counter agents again and I told her that this would be fine.  She should continue to perform sitz bath's and avoid constipation.  Should she desire surgical resection of the remaining hemorrhoidal tissue, she will contact me.  For now, we have not made any additional follow-up appointments here

## 2019-05-06 ENCOUNTER — Ambulatory Visit: Payer: Managed Care, Other (non HMO) | Admitting: Gastroenterology

## 2019-05-06 ENCOUNTER — Encounter: Payer: Self-pay | Admitting: Gastroenterology

## 2019-05-06 VITALS — BP 114/70 | Temp 98.4°F | Ht 61.0 in | Wt 140.0 lb

## 2019-05-06 DIAGNOSIS — R1013 Epigastric pain: Secondary | ICD-10-CM | POA: Diagnosis not present

## 2019-05-06 DIAGNOSIS — R112 Nausea with vomiting, unspecified: Secondary | ICD-10-CM

## 2019-05-06 DIAGNOSIS — R7401 Elevation of levels of liver transaminase levels: Secondary | ICD-10-CM

## 2019-05-06 DIAGNOSIS — K760 Fatty (change of) liver, not elsewhere classified: Secondary | ICD-10-CM

## 2019-05-06 DIAGNOSIS — R740 Nonspecific elevation of levels of transaminase and lactic acid dehydrogenase [LDH]: Secondary | ICD-10-CM

## 2019-05-06 NOTE — Patient Instructions (Signed)
If you are age 34 or older, your body mass index should be between 23-30. Your Body mass index is 26.45 kg/m. If this is out of the aforementioned range listed, please consider follow up with your Primary Care Provider.  If you are age 51 or younger, your body mass index should be between 19-25. Your Body mass index is 26.45 kg/m. If this is out of the aformentioned range listed, please consider follow up with your Primary Care Provider.   To help prevent the possible spread of infection to our patients, communities, and staff; we will be implementing the following measures:  As of now we are not allowing any visitors/family members to accompany you to any upcoming appointments with [facility]. If you have any concerns about this please contact our office to discuss prior to the appointment.   Please go to the lab in the basement of our building to have lab work done as you leave today. Hit "B" for basement when you get on the elevator.  When the doors open the lab is on your left.  We will call you with the results. Thank you.  You have been scheduled for an endoscopy. Please follow written instructions given to you at your visit today. If you use inhalers (even only as needed), please bring them with you on the day of your procedure. Your physician has requested that you go to www.startemmi.com and enter the access code given to you at your visit today. This web site gives a general overview about your procedure. However, you should still follow specific instructions given to you by our office regarding your preparation for the procedure.  Call our office if you experience severe pain.  Thank you for entrusting me with your care and for choosing [facility], Dr. Halls Cellar

## 2019-05-06 NOTE — Progress Notes (Signed)
HPI :  33 year old female with a history of GERD, migraines, nausea and vomiting, referred by Philis Nettle, FNP for nausea vomiting, epigastric pain.  She endorses symptoms starting roughly 1 to 2 years ago that have been bothering her significantly.  She will have episodes of severe epigastric pain associated with nausea and vomiting and inability to tolerate p.o. for several hours at a time.  She states the episodes will last upwards of 8 hours or so and then abate on its own.  Episodes will occur every 3 to 4 months or so.  In between episodes she stands to feel well without any significant symptoms.  She cannot think of any triggers that precipitate the symptoms.  She has occasional reflux and heartburn that bothers her, she denies any dysphagia.  She denies any routine NSAID use.  Her last episode that bothered her was in early August.  Denies any bowel trouble during this time.  She denies any blood in her stools.  She notes that most cases occur at night.  Her mother had ovarian cancer, she denies any family history of gastric or colon cancer.  Her grandmother had pancreatic cancer.  She denies any prior episodes of pancreatitis in the past.  She has tried using Zofran at the onset of her last episode which did seem to reduce the duration of it.  She denies any tobacco use, only rare alcohol use, denies any marijuana use.  He had a recent ultrasound of her right upper quadrant which showed fatty liver and a complex cystic structure in her left kidney.  No gallstones in the gallbladder.  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  Incidentally noted was fatty liver and possible pancreas devising.  No other pathology was noted to cause her symptoms.  She is taken some Tums periodically and omeprazole remotely for reflux but nothing recently.  She denies any weight loss.  Looking at labs over time she has a  persistent mild elevation in ALT to the high 40s.   [place] 03/11/19 - fatty liver, complex cystic stricture lower pole left kidney  MRI 03/15/19 - Bosknak type 2 F cyst, repeat MRI in 6 months, possible pancreas divisum, fatty liver  Past Medical History:  Diagnosis Date  . Anxiety   . Arthritis    FINGER, ELBOW  . Chicken pox   . Depression   . GERD (gastroesophageal reflux disease)    OCC- NO MEDS  . Migraine    H/O MIGRAINES     Past Surgical History:  Procedure Laterality Date  . HEMORRHOID SURGERY N/A 04/05/2019   Procedure: HEMORRHOIDECTOMY, EXCISION ANAL SKIN TAG;  Surgeon: Fredirick Maudlin, MD;  Location: [facility] ORS;  Service: General;  Laterality: N/A;  . WISDOM TOOTH EXTRACTION  2013?   Family History  Problem Relation Age of Onset  . Cancer Mother        ovarian  . Hyperlipidemia Mother   . Mental illness Mother        anxiety and depression  . Alcohol abuse Father   . Hypertension Maternal Grandmother   . Heart disease Maternal Grandmother   . Stroke Maternal Grandmother   . Mental illness Maternal Grandmother   . Alcohol abuse Paternal Grandfather   . Cancer Paternal Grandfather   . Breast cancer Paternal Grandmother   . Breast cancer Paternal Great-grandmother   . Breast cancer Paternal Aunt  Social History   Tobacco Use  . Smoking status: Never Smoker  . Smokeless tobacco: Never Used  Substance Use Topics  . Alcohol use: Yes    Alcohol/week: 1.0 standard drinks    Types: 1 Standard drinks or equivalent per week    Comment: 1 glass of wine every few weeks.   . Drug use: No   Current Outpatient Medications  Medication Sig Dispense Refill  . ALPRAZolam (XANAX) 0.5 MG tablet Take 1 tablet (0.5 mg total) by mouth at bedtime as needed for anxiety. 30 tablet 1  . AZO-CRANBERRY PO Take 1 tablet by mouth daily.    . cyclobenzaprine (FLEXERIL) 5 MG tablet Take 1 tablet (5 mg total) by mouth 3 (three) times daily as needed for muscle spasms. Can cause  drowsiness. Do not drive after taking. 30 tablet 0  . docusate sodium (COLACE) 100 MG capsule Take 1 capsule (100 mg total) by mouth 2 (two) times daily. 30 capsule 2  . ibuprofen (ADVIL) 200 MG tablet Take 400 mg by mouth every 6 (six) hours as needed for headache or moderate pain.    Marland Kitchen. ibuprofen (ADVIL) 800 MG tablet Take 1 tablet (800 mg total) by mouth every 8 (eight) hours as needed. 30 tablet 0  . ondansetron (ZOFRAN ODT) 4 MG disintegrating tablet Take 1 tablet (4 mg total) by mouth every 8 (eight) hours as needed for nausea or vomiting. 20 tablet 2  . psyllium (METAMUCIL SMOOTH TEXTURE) 58.6 % powder Take 1 packet by mouth daily. 283 g 2   Current Facility-Administered Medications  Medication Dose Route Frequency Provider Last Rate Last Dose  . etonogestrel (NEXPLANON) implant 68 mg  68 mg Subdermal Once Farrel ConnersGutierrez, Colleen, CNM       Allergies  Allergen Reactions  . Iodine Other (See Comments)    Blisters  . Shellfish Allergy Swelling and Rash     Review of Systems: All systems reviewed and negative except where noted in HPI.   Lab Results  Component Value Date   WBC 7.8 03/04/2019   HGB 15.6 03/04/2019   HCT 44.9 03/04/2019   MCV 85 03/04/2019   PLT 208 03/04/2019    Lab Results  Component Value Date   CREATININE 0.86 03/04/2019   BUN 16 03/04/2019   NA 140 03/04/2019   K 4.3 03/04/2019   CL 105 03/04/2019   CO2 20 03/04/2019    Lab Results  Component Value Date   ALT 49 (H) 03/04/2019   AST 26 03/04/2019   ALKPHOS 90 03/04/2019   BILITOT 1.1 03/04/2019     Physical Exam: BP 114/70   Temp 98.4 F (36.9 C)   Ht 5\' 1"  (1.549 m)   Wt 140 lb (63.5 kg)   BMI 26.45 kg/m  Constitutional: Pleasant,well-developed, female in no acute distress. HEENT: Normocephalic and atraumatic. Conjunctivae are normal. No scleral icterus. Neck supple.  Cardiovascular: Normal rate, regular rhythm.  Pulmonary/chest: Effort normal and breath sounds normal. No wheezing,  rales or rhonchi. Abdominal: Soft, nondistended, nontender. . There are no masses palpable. No hepatomegaly. Extremities: no edema Lymphadenopathy: No cervical adenopathy noted. Neurological: Alert and oriented to person place and time. Skin: Skin is warm and dry. No rashes noted. Psychiatric: Normal mood and affect. Behavior is normal.   ASSESSMENT AND PLAN: 33 year old female here for new patient assessment of the following:  Nausea and vomiting / Epigastric pain - history as above, intermittent episodes of severe epigastric pain associated with nausea and vomiting, with no symptoms  in between episodes.  No clear triggers that precipitate this.  I discussed differential diagnosis with her to include pancreatitis, biliary colic, peptic ulcer disease, H. pylori gastritis, functional bowel disorder such as cyclical vomiting syndrome.  Ideally it would be beneficial to have labs done at the time of symptoms, she has never had that done, recommend she call me the next time she has an episode we will send her to the labs and ensure normal amylase and lipase and LFTs. Otherwise her recent imaging is reassuring, nothing concerning other than renal cyst which warrants some follow-up as outlined. No gallstones noted. Discussed options moving forward with her.  I offered her an upper endoscopy to further evaluate, rule out upper tract pathology.  I discussed risk and benefits of EGD and anesthesia with her and she want to proceed.  Further recommendations pending that result.  We will plan on screening her for H. pylori.  Pending that result may consider prophylactic PPI to take daily in hopes of minimizing risk of recurrence.  She should continue Zofran as needed and use it at the onset of an episode.    Fatty liver / elevated ALT - longstanding mild ALT elevation.  I suspect this is due to fatty liver and I counseled her on what fatty liver is and potential associated risks.  She is not overweight, she does  not drink alcohol.  Do recommend she increase her exercise more routinely.  Otherwise I will screen her for other causes of chronic liver disease to ensure no other cause of her chronic ALT elevation.  We will get those results back to her once available.  She agreed  Ileene Patrick, MD [facility]  CC: Tracey Harries, FNP

## 2019-05-08 ENCOUNTER — Other Ambulatory Visit: Payer: Self-pay

## 2019-05-08 ENCOUNTER — Telehealth: Payer: Self-pay | Admitting: Gastroenterology

## 2019-05-08 LAB — IRON AND TIBC
Iron Saturation: 14 % — ABNORMAL LOW (ref 15–55)
Iron: 51 ug/dL (ref 27–159)
Total Iron Binding Capacity: 375 ug/dL (ref 250–450)
UIBC: 324 ug/dL (ref 131–425)

## 2019-05-08 LAB — ANA: Anti Nuclear Antibody (ANA): NEGATIVE

## 2019-05-08 LAB — HEPATITIS A ANTIBODY, TOTAL: hep A Total Ab: NEGATIVE

## 2019-05-08 LAB — ANTI-SMOOTH MUSCLE ANTIBODY, IGG: Smooth Muscle Ab: 9 Units (ref 0–19)

## 2019-05-08 LAB — HEPATITIS B SURFACE ANTIGEN: Hepatitis B Surface Ag: NEGATIVE

## 2019-05-08 LAB — HEPATITIS C ANTIBODY: Hep C Virus Ab: 0.1 s/co ratio (ref 0.0–0.9)

## 2019-05-08 LAB — IGG: IgG (Immunoglobin G), Serum: 997 mg/dL (ref 586–1602)

## 2019-05-08 LAB — HEPATITIS B SURFACE ANTIBODY,QUALITATIVE: Hep B Surface Ab, Qual: REACTIVE

## 2019-05-08 LAB — FERRITIN: Ferritin: 51 ng/mL (ref 15–150)

## 2019-05-08 LAB — ALPHA-1-ANTITRYPSIN: A-1 Antitrypsin: 134 mg/dL (ref 100–188)

## 2019-05-08 LAB — CERULOPLASMIN: Ceruloplasmin: 26.4 mg/dL (ref 19.0–39.0)

## 2019-05-08 NOTE — Telephone Encounter (Signed)

## 2019-05-08 NOTE — Progress Notes (Signed)
he

## 2019-05-09 ENCOUNTER — Encounter: Payer: Self-pay | Admitting: Gastroenterology

## 2019-05-09 ENCOUNTER — Other Ambulatory Visit: Payer: Self-pay

## 2019-05-09 ENCOUNTER — Ambulatory Visit (AMBULATORY_SURGERY_CENTER): Payer: Managed Care, Other (non HMO) | Admitting: Gastroenterology

## 2019-05-09 VITALS — BP 119/87 | HR 80 | Temp 98.5°F | Resp 23 | Ht 61.0 in | Wt 140.0 lb

## 2019-05-09 DIAGNOSIS — K2970 Gastritis, unspecified, without bleeding: Secondary | ICD-10-CM | POA: Diagnosis not present

## 2019-05-09 DIAGNOSIS — R112 Nausea with vomiting, unspecified: Secondary | ICD-10-CM | POA: Diagnosis present

## 2019-05-09 MED ORDER — SODIUM CHLORIDE 0.9 % IV SOLN: 500.0000 mL | Freq: Once | INTRAVENOUS | Status: DC

## 2019-05-09 NOTE — Progress Notes (Signed)
Temp taken JB VS taken by CW

## 2019-05-09 NOTE — Patient Instructions (Signed)
Await pathology results.     YOU HAD AN ENDOSCOPIC PROCEDURE TODAY AT THE [facility]:   Refer to the procedure report that was given to you for any specific questions about what was found during the examination.  If the procedure report does not answer your questions, please call your gastroenterologist to clarify.  If you requested that your care partner not be given the details of your procedure findings, then the procedure report has been included in a sealed envelope for you to review at your convenience later.  YOU SHOULD EXPECT: Some feelings of bloating in the abdomen. Passage of more gas than usual.  Walking can help get rid of the air that was put into your GI tract during the procedure and reduce the bloating. If you had a lower endoscopy (such as a colonoscopy or flexible sigmoidoscopy) you may notice spotting of blood in your stool or on the toilet paper. If you underwent a bowel prep for your procedure, you may not have a normal bowel movement for a few days.  Please Note:  You might notice some irritation and congestion in your nose or some drainage.  This is from the oxygen used during your procedure.  There is no need for concern and it should clear up in a day or so.  SYMPTOMS TO REPORT IMMEDIATELY:    Following upper endoscopy (EGD)  Vomiting of blood or coffee ground material  New chest pain or pain under the shoulder blades  Painful or persistently difficult swallowing  New shortness of breath  Fever of 100F or higher  Black, tarry-looking stools  For urgent or emergent issues, a gastroenterologist can be reached at any hour by calling [phone].   DIET:  We do recommend a small meal at first, but then you may proceed to your regular diet.  Drink plenty of fluids but you should avoid alcoholic beverages for 24 hours.  ACTIVITY:  You should plan to take it easy for the rest of today and you should NOT DRIVE or use heavy machinery until tomorrow  (because of the sedation medicines used during the test).    FOLLOW UP: Our staff will call the number listed on your records 48-72 hours following your procedure to check on you and address any questions or concerns that you may have regarding the information given to you following your procedure. If we do not reach you, we will leave a message.  We will attempt to reach you two times.  During this call, we will ask if you have developed any symptoms of COVID 19. If you develop any symptoms (ie: fever, flu-like symptoms, shortness of breath, cough etc.) before then, please call [phone].  If you test positive for Covid 19 in the 2 weeks post procedure, please call and report this information to us.    If any biopsies were taken you will be contacted by phone or by letter within the next 1-3 weeks.  Please call us at [phone] if you have not heard about the biopsies in 3 weeks.    SIGNATURES/CONFIDENTIALITY: You and/or your care partner have signed paperwork which will be entered into your electronic medical record.  These signatures attest to the fact that that the information above on your After Visit Summary has been reviewed and is understood.  Full responsibility of the confidentiality of this discharge information lies with you and/or your care-partner. 

## 2019-05-09 NOTE — Progress Notes (Signed)
Called to room to assist during endoscopic procedure.  Patient ID and intended procedure confirmed with present staff. Received instructions for my participation in the procedure from the performing physician.  

## 2019-05-09 NOTE — Op Note (Signed)
[facility] Patient Name: Rebecca Davies Procedure Date: 05/09/2019 4:04 PM MRN: [id number] Endoscopist: Viviann Spare P. Adela Lank , MD Age: 33 Referring MD:  Date of Birth: [birth year] Gender: Female Account #: [id number] Procedure:                Upper GI endoscopy Indications:              episodic severe epigastric abdominal pain with                            nausea / vomiting, unclear etiology Medicines:                Monitored Anesthesia Care Procedure:                Pre-Anesthesia Assessment:                           - Prior to the procedure, a History and Physical                            was performed, and patient medications and                            allergies were reviewed. The patient's tolerance of                            previous anesthesia was also reviewed. The risks                            and benefits of the procedure and the sedation                            options and risks were discussed with the patient.                            All questions were answered, and informed consent                            was obtained. Prior Anticoagulants: The patient has                            taken no previous anticoagulant or antiplatelet                            agents. ASA Grade Assessment: II - A patient with                            mild systemic disease. After reviewing the risks                            and benefits, the patient was deemed in                            satisfactory condition to undergo the procedure.  After obtaining informed consent, the endoscope was                            passed under direct vision. Throughout the                            procedure, the patient's blood pressure, pulse, and                            oxygen saturations were monitored continuously. The                            Endoscope was introduced through the mouth, and                            advanced to the  second part of duodenum. The upper                            GI endoscopy was accomplished without difficulty.                            The patient tolerated the procedure well. Scope In: Scope Out: Findings:                 Esophagogastric landmarks were identified: the                            Z-line was found at 35 cm, the gastroesophageal                            junction was found at 35 cm and the upper extent of                            the gastric folds was found at 35 cm from the                            incisors.                           The exam of the esophagus was otherwise normal.                           Patchy mildly erythematous mucosa was found in the                            gastric fundus without ulceration.                           The exam of the stomach was otherwise normal.                           Biopsies were taken with a cold forceps in the  gastric fundus, in the gastric body, at the                            incisura and in the gastric antrum for Helicobacter                            pylori testing.                           The duodenal bulb and second portion of the                            duodenum were normal. Complications:            No immediate complications. Estimated blood loss:                            Minimal. Estimated Blood Loss:     Estimated blood loss was minimal. Impression:               - Esophagogastric landmarks identified.                           - Normal esophagus otherwise                           - Erythematous mucosa in the gastric fundus.                           - Normal stomach otherwise - biopsies taken to rule                            out H pylori                           - Normal duodenal bulb and second portion of the                            duodenum. Recommendation:           - Patient has a contact number available for                            emergencies. The  signs and symptoms of potential                            delayed complications were discussed with the                            patient. Return to normal activities tomorrow.                            Written discharge instructions were provided to the                            patient.                           -  Resume previous diet.                           - Continue present medications.                           - Await pathology results. Viviann Spare P. Caylon Saine, MD 05/09/2019 4:21:07 PM This report has been signed electronically.

## 2019-05-09 NOTE — Progress Notes (Signed)
To PACU, VSS. Report to [email or web address] 

## 2019-05-13 ENCOUNTER — Other Ambulatory Visit: Payer: Self-pay

## 2019-05-13 ENCOUNTER — Telehealth: Payer: Self-pay

## 2019-05-13 ENCOUNTER — Telehealth: Payer: Self-pay | Admitting: Gastroenterology

## 2019-05-13 NOTE — Telephone Encounter (Signed)
Left message on follow up call. 

## 2019-05-13 NOTE — Telephone Encounter (Signed)
Labs for ALT elevation returned as follows: done on 05/06/19  Iron 51,  TIBC 375 Iron sat 14% Ferritin 51  Hep B surface antibody - reactive Hep A total AB - NEGATIVE  Hep B surface antigen negative Hep C antibody negative  Smooth muscle antibody negative ANA negative IgG 997  Alpha one antitrypsin 134 Ceruloplasmin 26.4    Rebecca Davies can you please let the patient know her labs for workup for chronic liver diseases are NEGATIVE and that the elevated ALT is likely due to fatty liver. We discussed increasing her exercise and repeat the LFTs in 6 months. She is in need of a hepatitis A vaccine if you can help coordinate. Thanks

## 2019-05-13 NOTE — Telephone Encounter (Signed)
  Follow up Call-  Call back number 05/09/2019  Post procedure Call Back phone  # [phone]  Permission to leave phone message Yes  Some recent data might be hidden     Patient questions:  Do you have a fever, pain , or abdominal swelling? No. Pain Score  0 *  Have you tolerated food without any problems? Yes.    Have you been able to return to your normal activities? Yes.    Do you have any questions about your discharge instructions: Diet   No. Medications  No. Follow up visit  No.  Do you have questions or concerns about your Care? No.  Actions: * If pain score is 4 or above: No action needed, pain <4. 1. Have you developed a fever since your procedure? no  2.   Have you had an respiratory symptoms (SOB or cough) since your procedure? no  3.   Have you tested positive for COVID 19 since your procedure no  4.   Have you had any family members/close contacts diagnosed with the COVID 19 since your procedure?  no   If yes to any of these questions please route to Joylene John, RN and Alphonsa Gin, Therapist, sports.

## 2019-05-13 NOTE — Telephone Encounter (Signed)
Called patient and gave lab results. Patient has appts. to get her Hep A vaccines

## 2019-05-15 ENCOUNTER — Other Ambulatory Visit: Payer: Self-pay

## 2019-05-15 MED ORDER — PANTOPRAZOLE SODIUM 40 MG PO TBEC: 40.0000 mg | DELAYED_RELEASE_TABLET | Freq: Every day | ORAL | 3 refills | Status: DC

## 2019-05-16 ENCOUNTER — Ambulatory Visit (INDEPENDENT_AMBULATORY_CARE_PROVIDER_SITE_OTHER): Payer: Managed Care, Other (non HMO) | Admitting: Gastroenterology

## 2019-05-16 ENCOUNTER — Other Ambulatory Visit: Payer: Self-pay

## 2019-05-16 DIAGNOSIS — Z23 Encounter for immunization: Secondary | ICD-10-CM | POA: Diagnosis not present

## 2019-06-16 ENCOUNTER — Telehealth: Payer: Managed Care, Other (non HMO) | Admitting: Family

## 2019-06-16 DIAGNOSIS — N390 Urinary tract infection, site not specified: Secondary | ICD-10-CM

## 2019-06-16 NOTE — Progress Notes (Signed)
Based on what you shared with me, I feel your condition warrants further evaluation and I recommend that you be seen for a face to face office visit.  Given your symptoms have return, you need to be seen face to face for a urine culture.   NOTE: If you entered your credit card information for this eVisit, you will not be charged. You may see a "hold" on your card for the $35 but that hold will drop off and you will not have a charge processed.  If you are having a true medical emergency please call 911.     For an urgent face to face visit, [facility] has four urgent care centers for your convenience:    NEW:  [facility] [place]   [email or web address]+[email or web address]+[place]%2c+[place]+[address]                                                   [place], [place] [address] .  Monday - Friday 10 am - 6 pm    . [facility]    [phone]                  Get Driving Directions  [address] [place], [place] [address] . 10 am to 8 pm Monday-Friday . 12 pm to 8 pm Saturday-Sunday   . [facility] [place]                  Get Driving Directions  [address], [place], [place] [address] . 8 am to 8 pm Monday-Friday . 9 am to 6 pm Saturday . 11 am to 6 pm Sunday     . [facility] [place]                  Get Driving Directions   [address] [place], [place] [address] . 8 am to 8 pm Monday-Friday . 8 am to 4 pm Saturday-Sunday    . [facility] [place]                    Get Driving Directions  [phone]  [address], [place], [place] [address]  . Monday-Friday, 12 PM to 6 PM    Your e-visit answers were reviewed by a board certified advanced clinical practitioner to  complete your personal care plan.  Thank you for using e-Visits.

## 2019-06-20 ENCOUNTER — Encounter: Payer: Self-pay | Admitting: Family Medicine

## 2019-06-20 ENCOUNTER — Other Ambulatory Visit: Payer: Self-pay

## 2019-06-20 ENCOUNTER — Ambulatory Visit: Payer: Managed Care, Other (non HMO) | Admitting: Family Medicine

## 2019-06-20 VITALS — BP 110/80 | HR 86 | Wt 144.4 lb

## 2019-06-20 DIAGNOSIS — F419 Anxiety disorder, unspecified: Secondary | ICD-10-CM | POA: Diagnosis not present

## 2019-06-20 DIAGNOSIS — N390 Urinary tract infection, site not specified: Secondary | ICD-10-CM | POA: Diagnosis not present

## 2019-06-20 DIAGNOSIS — K649 Unspecified hemorrhoids: Secondary | ICD-10-CM | POA: Diagnosis not present

## 2019-06-20 DIAGNOSIS — F329 Major depressive disorder, single episode, unspecified: Secondary | ICD-10-CM

## 2019-06-20 DIAGNOSIS — K5900 Constipation, unspecified: Secondary | ICD-10-CM | POA: Diagnosis not present

## 2019-06-20 LAB — POCT URINALYSIS DIPSTICK
Bilirubin, UA: POSITIVE
Blood, UA: NEGATIVE
Glucose, UA: NEGATIVE
Ketones, UA: POSITIVE
Nitrite, UA: NEGATIVE
Protein, UA: POSITIVE — AB
Spec Grav, UA: >=1.03 — AB (ref 1.010–1.025)
Urobilinogen, UA: 0.2 E.U./dL
pH, UA: 5.5 (ref 5.0–8.0)

## 2019-06-20 MED ORDER — ALPRAZOLAM 0.5 MG PO TABS: 0.5000 mg | ORAL_TABLET | Freq: Every evening | ORAL | 1 refills | Status: AC | PRN

## 2019-06-20 MED ORDER — CIPROFLOXACIN HCL 250 MG PO TABS: 250.0000 mg | ORAL_TABLET | Freq: Two times a day (BID) | ORAL | 0 refills | Status: DC

## 2019-06-20 MED ORDER — DOCUSATE SODIUM 100 MG PO CAPS: 100.0000 mg | ORAL_CAPSULE | Freq: Two times a day (BID) | ORAL | 2 refills | Status: DC

## 2019-06-20 NOTE — Progress Notes (Signed)
Subjective:    Patient ID: Rebecca Davies, female    DOB: 1985-09-01, 33 y.o.   MRN: [id number]  HPI   Patient presents to clinic due to possible UTI.  Patient states she did a virtual visit through her insurance company a little over a week ago and was treated with Macrobid for suspected UTI.  Patient states her symptoms have improved somewhat, but continues to have issues of bradycardia and frequency and urgency.  Not sure if UTI completely cleared up.  Urine was not sent out for culture with her previous visit through the insurance company.  No fever or chills.  No vomiting or diarrhea.  No severe abdominal pain.  Patient Active Problem List   Diagnosis Date Noted  . Anal skin tag   . Grade III hemorrhoids   . Left hip pain 05/10/2017  . 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 Routine general medical examination at a health care facility 09/04/2014  . Anxiety and depression 09/04/2014   Social History   Tobacco Use  . Smoking status: Never Smoker  . Smokeless tobacco: Never Used  Substance Use Topics  . Alcohol use: Yes    Alcohol/week: 1.0 standard drinks    Types: 1 Standard drinks or equivalent per week    Comment: 1 glass of wine every few weeks.    Review of Systems  Constitutional: Negative for chills, fatigue and fever.  HENT: Negative for congestion, ear pain, sinus pain and sore throat.   Eyes: Negative.   Respiratory: Negative for cough, shortness of breath and wheezing.   Cardiovascular: Negative for chest pain, palpitations and leg swelling.  Gastrointestinal: Negative for abdominal pain, diarrhea, nausea and vomiting.  Genitourinary: +dysuria, frequency and urgency.  Musculoskeletal: Negative for arthralgias and myalgias.  Skin: Negative for color change, pallor and rash.  Neurological: Negative for syncope, light-headedness and headaches.  Psychiatric/Behavioral: The patient is not nervous/anxious.       Objective:   Physical Exam Vitals signs and nursing  note reviewed.  Constitutional:      General: She is not in acute distress.    Appearance: She is not toxic-appearing.  HENT:     Head: Normocephalic and atraumatic.  Eyes:     General: No scleral icterus.    Conjunctiva/sclera: Conjunctivae normal.  Cardiovascular:     Rate and Rhythm: Normal rate and regular rhythm.     Heart sounds: Normal heart sounds.  Pulmonary:     Effort: Pulmonary effort is normal.     Breath sounds: Normal breath sounds.  Abdominal:     General: Bowel sounds are normal. There is no distension.     Palpations: Abdomen is soft.     Tenderness: There is abdominal tenderness (mild suprapubic tenderness). There is no right CVA tenderness, left CVA tenderness or guarding.  Musculoskeletal:     Right lower leg: No edema.     Left lower leg: No edema.  Skin:    General: Skin is warm and dry.     Coloration: Skin is not jaundiced or pale.  Neurological:     Mental Status: She is alert and oriented to person, place, and time.     Gait: Gait normal.  Psychiatric:        Mood and Affect: Mood normal.        Behavior: Behavior normal.    Today's Vitals   06/20/19 0809  BP: 110/80  Pulse: 86  SpO2: 96%  Weight: 144 lb 6.4 oz (65.5 kg)  Body mass index is 27.28 kg/m.     Assessment & Plan:   UTI: Patient urinalysis is positive for some leukocytes.  We will treat with Cipro twice daily for 3 days and send urine out for culture.  Suspect she may have some lingering leftover UTI bacteria that was not fully treated for Macrobid course.  Encouraged her to keep up with water intake, avoid excess caffeine and sugary beverages wear cotton underwear, avoid holding urine and always wipe front to back.  Patient will be made aware of urine culture results when available and she will let us know if symptoms persist.

## 2019-06-21 LAB — URINE CULTURE
MICRO NUMBER:: 1093826
SPECIMEN QUALITY:: ADEQUATE

## 2019-07-11 ENCOUNTER — Ambulatory Visit (HOSPITAL_COMMUNITY)
Admission: EM | Admit: 2019-07-11 | Discharge: 2019-07-11 | Disposition: A | Discharge status: Home / Self Care | Payer: Managed Care, Other (non HMO) | Attending: Family Medicine | Admitting: Family Medicine

## 2019-07-11 ENCOUNTER — Telehealth: Payer: Self-pay | Admitting: Family Medicine

## 2019-07-11 ENCOUNTER — Other Ambulatory Visit: Payer: Self-pay

## 2019-07-11 ENCOUNTER — Telehealth: Payer: Managed Care, Other (non HMO)

## 2019-07-11 ENCOUNTER — Encounter (HOSPITAL_COMMUNITY): Payer: Self-pay | Admitting: Family Medicine

## 2019-07-11 DIAGNOSIS — N3000 Acute cystitis without hematuria: Secondary | ICD-10-CM | POA: Insufficient documentation

## 2019-07-11 LAB — POCT URINALYSIS DIP (DEVICE)
Bilirubin Urine: NEGATIVE
Glucose, UA: NEGATIVE mg/dL
Hgb urine dipstick: NEGATIVE
Ketones, ur: NEGATIVE mg/dL
Nitrite: POSITIVE — AB
Protein, ur: NEGATIVE mg/dL
Specific Gravity, Urine: 1.025 (ref 1.005–1.030)
Urobilinogen, UA: 0.2 mg/dL (ref 0.0–1.0)
pH: 6.5 (ref 5.0–8.0)

## 2019-07-11 MED ORDER — SULFAMETHOXAZOLE-TRIMETHOPRIM 800-160 MG PO TABS: 1.0000 | ORAL_TABLET | Freq: Two times a day (BID) | ORAL | 0 refills | Status: DC

## 2019-07-11 NOTE — ED Provider Notes (Signed)
[facility]    CSN: [id number] Arrival date & time: 07/11/19  1448      History   Chief Complaint Chief Complaint  Patient presents with  . Appointment  . Recurrent UTI    HPI Rebecca Davies is a 33 y.o. female.   Initial [facility] patient visit  Patient presents to Urgent Care with complaints of feeling like she cannot empty her bladder, causing UTIs since the end of august when she had hemorrhoids removed. Patient reports she is experiencing burning and odorous urine, does not have frequency as much as with the others.       Past Medical History:  Diagnosis Date  . Anxiety   . Arthritis    FINGER, ELBOW  . Chicken pox   . Depression   . GERD (gastroesophageal reflux disease)    OCC- NO MEDS  . Migraine    H/O MIGRAINES    Patient Active Problem List   Diagnosis Date Noted  . Anal skin tag   . Grade III hemorrhoids   . Left hip pain 05/10/2017  . 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 Routine general medical examination at a health care facility 09/04/2014  . Anxiety and depression 09/04/2014    Past Surgical History:  Procedure Laterality Date  . HEMORRHOID SURGERY N/A 04/05/2019   Procedure: HEMORRHOIDECTOMY, EXCISION ANAL SKIN TAG;  Surgeon: Fredirick Maudlin, MD;  Location: [facility] ORS;  Service: General;  Laterality: N/A;  . WISDOM TOOTH EXTRACTION  2013?    OB History    Gravida  1   Para  1   Term  1   Preterm      AB      Living  1     SAB      TAB      Ectopic      Multiple      Live Births  1        Obstetric Comments  1st Menstrual Cycle:  12 1st Pregnancy:  22          Home Medications    Prior to Admission medications   Medication Sig Start Date End Date Taking? Authorizing Provider  ALPRAZolam Duanne Moron) 0.5 MG tablet Take 1 tablet (0.5 mg total) by mouth at bedtime as needed for anxiety. 06/20/19   Guse, Jacquelynn Cree, FNP  AZO-CRANBERRY PO Take 1 tablet by mouth daily.    [provider]  ciprofloxacin  (CIPRO) 250 MG tablet Take 1 tablet (250 mg total) by mouth 2 (two) times daily. 06/20/19   Jodelle Green, FNP  docusate sodium (COLACE) 100 MG capsule Take 1 capsule (100 mg total) by mouth 2 (two) times daily. 06/20/19   Jodelle Green, FNP  pantoprazole (PROTONIX) 40 MG tablet Take 1 tablet (40 mg total) by mouth daily. 05/15/19   Armbruster, Carlota Raspberry, MD  sulfamethoxazole-trimethoprim (BACTRIM DS) 800-160 MG tablet Take 1 tablet by mouth 2 (two) times daily. 07/11/19   Robyn Haber, MD    Family History Family History  Problem Relation Age of Onset  . Cancer Mother        ovarian  . Hyperlipidemia Mother   . Mental illness Mother        anxiety and depression  . Alcohol abuse Father   . Hypertension Maternal Grandmother   . Heart disease Maternal Grandmother   . Stroke Maternal Grandmother   . Mental illness Maternal Grandmother   . Alcohol abuse Paternal Grandfather   . Cancer Paternal Grandfather   .  Breast cancer Paternal Grandmother   . Breast cancer Paternal Great-grandmother   . Breast cancer Paternal Aunt   . Stomach cancer Neg Hx   . Rectal cancer Neg Hx   . Colon cancer Neg Hx   . Esophageal cancer Neg Hx     Social History Social History   Tobacco Use  . Smoking status: Never Smoker  . Smokeless tobacco: Never Used  Substance Use Topics  . Alcohol use: Yes    Alcohol/week: 1.0 standard drinks    Types: 1 Standard drinks or equivalent per week    Comment: 1 glass of wine every few weeks.   . Drug use: No     Allergies   Iodine and Shellfish allergy   Review of Systems Review of Systems  Constitutional: Negative.   Eyes: Negative.   Respiratory: Negative.   Gastrointestinal: Negative.   Genitourinary: Positive for dysuria, frequency and urgency. Negative for hematuria.  All other systems reviewed and are negative.    Physical Exam Triage Vital Signs ED Triage Vitals [07/11/19 1508]  Enc Vitals Group     BP      Pulse      Resp       Temp      Temp src      SpO2      Weight      Height      Head Circumference      Peak Flow      Pain Score 0     Pain Loc      Pain Edu?      Excl. in GC?    No data found.  Updated Vital Signs BP 133/83 (BP Location: Right Arm)   Pulse 79   Temp 98.7 F (37.1 C) (Oral)   Resp 16   SpO2 100%    Physical Exam Vitals signs and nursing note reviewed.  Constitutional:      Appearance: Normal appearance. She is normal weight.  HENT:     Head: Normocephalic.  Neck:     Musculoskeletal: Normal range of motion and neck supple.  Cardiovascular:     Rate and Rhythm: Normal rate.  Pulmonary:     Effort: Pulmonary effort is normal.  Musculoskeletal: Normal range of motion.  Skin:    General: Skin is warm.  Neurological:     General: No focal deficit present.     Mental Status: She is alert.  Psychiatric:        Mood and Affect: Mood normal.      UC Treatments / Results  Labs (all labs ordered are listed, but only abnormal results are displayed) Labs Reviewed  URINE CULTURE    EKG   Radiology No results found.  Procedures Procedures (including critical care time)  Medications Ordered in UC Medications - No data to display  Initial Impression / Assessment and Plan / UC Course  I have reviewed the triage vital signs and the nursing notes.  Pertinent labs & imaging results that were available during my care of the patient were reviewed by me and considered in my medical decision making (see chart for details).    Final Clinical Impressions(s) / UC Diagnoses   Final diagnoses:  Acute cystitis without hematuria   Discharge Instructions   None    ED Prescriptions    Medication Sig Dispense Auth. Provider   sulfamethoxazole-trimethoprim (BACTRIM DS) 800-160 MG tablet Take 1 tablet by mouth 2 (two) times daily. 20 tablet Elvina Sidle, MD  PDMP not reviewed this encounter.   Elvina SidleLauenstein, Jyoti Harju, MD 07/11/19 [phone]

## 2019-07-11 NOTE — ED Triage Notes (Signed)
Patient presents to Urgent Care with complaints of feeling like she cannot empty her bladder, causing UTIs since the end of august when she had hemorrhoids removed. Patient reports she is experiencing burning and odorous urine, does not have frequency as much as with the others.

## 2019-07-11 NOTE — Telephone Encounter (Signed)
I Canceled this appt beacuse it was schedueled on lab schedule and pt also sees a provider with [facility] so we would need to get approval from both providers if she was planning on transfering care, I called pt to see what she wanted to do but had to leave a message.

## 2019-07-13 LAB — URINE CULTURE: Culture: >=100000 — AB

## 2019-07-15 ENCOUNTER — Encounter: Payer: Managed Care, Other (non HMO) | Admitting: Nurse Practitioner

## 2019-07-16 ENCOUNTER — Other Ambulatory Visit: Payer: Self-pay

## 2019-07-16 MED ORDER — PANTOPRAZOLE SODIUM 40 MG PO TBEC: 40.0000 mg | DELAYED_RELEASE_TABLET | Freq: Every day | ORAL | 3 refills | Status: DC

## 2019-07-16 NOTE — Progress Notes (Signed)
Refill request rec'd from [facility] [address]. Appointment with Armbruster tomorrow on 07-16-19. Refill sent.

## 2019-07-17 ENCOUNTER — Encounter: Payer: Self-pay | Admitting: Gastroenterology

## 2019-07-17 ENCOUNTER — Other Ambulatory Visit: Payer: Self-pay

## 2019-07-17 ENCOUNTER — Ambulatory Visit: Payer: Managed Care, Other (non HMO) | Admitting: Gastroenterology

## 2019-07-17 VITALS — BP 110/82 | HR 99 | Temp 98.1°F | Ht 61.0 in | Wt 145.2 lb

## 2019-07-17 DIAGNOSIS — K602 Anal fissure, unspecified: Secondary | ICD-10-CM

## 2019-07-17 DIAGNOSIS — R112 Nausea with vomiting, unspecified: Secondary | ICD-10-CM | POA: Diagnosis not present

## 2019-07-17 DIAGNOSIS — R1013 Epigastric pain: Secondary | ICD-10-CM | POA: Diagnosis not present

## 2019-07-17 DIAGNOSIS — N281 Cyst of kidney, acquired: Secondary | ICD-10-CM

## 2019-07-17 DIAGNOSIS — K760 Fatty (change of) liver, not elsewhere classified: Secondary | ICD-10-CM

## 2019-07-17 MED ORDER — AMBULATORY NON FORMULARY MEDICATION: 1 refills | Status: DC

## 2019-07-17 NOTE — Patient Instructions (Addendum)
If you are age 34 or older, your body mass index should be between 23-30. Your Body mass index is 27.44 kg/m. If this is out of the aforementioned range listed, please consider follow up with your Primary Care Provider.  If you are age 45 or younger, your body mass index should be between 19-25. Your Body mass index is 27.44 kg/m. If this is out of the aformentioned range listed, please consider follow up with your Primary Care Provider.   We have sent a prescription for nitroglycerin 0.125% gel to [facility]. You should apply a pea size amount to your rectum three times daily x 6-8 weeks.  [facility]'s information is below: Address: [address] [place], [place] [address]  [phone]  *Please DO NOT go directly from our office to pick up this medication! Give the pharmacy 1 day to process the prescription as this is compounded and takes time to make.  Continue Protonix.  Continue Zofran as needed for nausea.   Thank you for entrusting me with your care and for choosing [facility], Dr. Barron Cellar

## 2019-07-17 NOTE — Progress Notes (Signed)
HPI :  33 year old female here for follow-up.  I previously have seen her for nausea vomiting and epigastric pain.  See prior clinic notes for details of her case.  She has had episodic severe epigastric pain associate with nausea and vomiting, inability to tolerate p.o. for several hours at a time.  She is had a prior right upper quadrant ultrasound which showed no gallstones in the gallbladder.  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  She did not have any other cause for her symptoms based off imaging.  I performed an endoscopy for her on October 1 which showed some patchy mild erythema in the stomach and biopsies negative for H. pylori.  I provided her some Zofran to use at the onset of any episode and to take Protonix 40 mg once a day and see if this helps prevent any occurrence.  She has not had any episodes since I have seen her and doing very well in this regard.  Main complaint today is her concerns for hemorrhoids and rectal pain.  She states she had a hemorrhoidectomy by Dr. Lady Garyannon in [place] in August.  She states she had internal hemorrhoids removed and skin tags removed.  She states she has developed very painful bowel movement at times.  She does not have much bleeding, but still has a external skin tag and wonders how to best manage this.  She denies any straining, using docusate daily for her bowels which keeps stools soft.  She denies any abdominal pains.  She inquires about hemorrhoid banding and if that is something she is a candidate for.  US 03/11/19 - fatty liver, complex cystic stricture lower pole left kidney  MRI 03/15/19 - Boskniak type 2 F cyst, repeat MRI in 6 months, possible pancreas divisum, fatty liver   EGD 05/09/19 -  - The exam of the esophagus was otherwise normal. - Patchy mildly erythematous mucosa was found in the gastric fundus without ulceration. - The exam of  the stomach was otherwise normal. - Biopsies were taken with a cold forceps in the gastric fundus, in the gastric body, at the incisura and in the gastric antrum for Helicobacter pylori testing. - The duodenal bulb and second portion of the duodenum were normal.    Past Medical History:  Diagnosis Date  . Anxiety   . Arthritis    FINGER, ELBOW  . Chicken pox   . Depression   . GERD (gastroesophageal reflux disease)    OCC- NO MEDS  . Migraine    H/O MIGRAINES     Past Surgical History:  Procedure Laterality Date  . HEMORRHOID SURGERY N/A 04/05/2019   Procedure: HEMORRHOIDECTOMY, EXCISION ANAL SKIN TAG;  Surgeon: Duanne Guessannon, Jennifer, MD;  Location: [facility] ORS;  Service: General;  Laterality: N/A;  . WISDOM TOOTH EXTRACTION  2013?   Family History  Problem Relation Age of Onset  . Cancer Mother        ovarian  . Hyperlipidemia Mother   . Mental illness Mother        anxiety and depression  . Alcohol abuse Father   . Hypertension Maternal Grandmother   . Heart disease Maternal Grandmother   . Stroke Maternal Grandmother   . Mental illness Maternal Grandmother   . Alcohol abuse Paternal Grandfather   . Cancer Paternal Grandfather   . Breast cancer Paternal Grandmother   . Breast cancer Paternal Great-grandmother   .  Breast cancer Paternal Aunt   . Stomach cancer Neg Hx   . Rectal cancer Neg Hx   . Colon cancer Neg Hx   . Esophageal cancer Neg Hx    Social History   Tobacco Use  . Smoking status: Never Smoker  . Smokeless tobacco: Never Used  Substance Use Topics  . Alcohol use: Yes    Alcohol/week: 1.0 standard drinks    Types: 1 Standard drinks or equivalent per week    Comment: 1 glass of wine every few weeks.   . Drug use: No   Current Outpatient Medications  Medication Sig Dispense Refill  . ALPRAZolam (XANAX) 0.5 MG tablet Take 1 tablet (0.5 mg total) by mouth at bedtime as needed for anxiety. 30 tablet 1  . AZO-CRANBERRY PO Take 1 tablet by mouth daily.     Marland Kitchen docusate sodium (COLACE) 100 MG capsule Take 1 capsule (100 mg total) by mouth 2 (two) times daily. 30 capsule 2  . pantoprazole (PROTONIX) 40 MG tablet Take 1 tablet (40 mg total) by mouth daily. 30 tablet 3  . sulfamethoxazole-trimethoprim (BACTRIM DS) 800-160 MG tablet Take 1 tablet by mouth 2 (two) times daily. 20 tablet 0   Current Facility-Administered Medications  Medication Dose Route Frequency Provider Last Rate Last Dose  . etonogestrel (NEXPLANON) implant 68 mg  68 mg Subdermal Once Dalia Heading, CNM       Allergies  Allergen Reactions  . Iodine Other (See Comments)    Blisters  . Shellfish Allergy Swelling and Rash     Review of Systems: All systems reviewed and negative except where noted in HPI.    Lab Results  Component Value Date   WBC 7.8 03/04/2019   HGB 15.6 03/04/2019   HCT 44.9 03/04/2019   MCV 85 03/04/2019   PLT 208 03/04/2019    Lab Results  Component Value Date   CREATININE 0.86 03/04/2019   BUN 16 03/04/2019   NA 140 03/04/2019   K 4.3 03/04/2019   CL 105 03/04/2019   CO2 20 03/04/2019    Lab Results  Component Value Date   ALT 49 (H) 03/04/2019   AST 26 03/04/2019   ALKPHOS 90 03/04/2019   BILITOT 1.1 03/04/2019     Physical Exam: BP 110/82 (BP Location: Left Arm, Patient Position: Sitting, Cuff Size: Normal)   Pulse 99   Temp 98.1 F (36.7 C)   Ht 5\' 1"  (1.549 m)   Wt 145 lb 4 oz (65.9 kg)   BMI 27.44 kg/m  Constitutional: Pleasant,well-developed, female in no acute distress. HEENT: Normocephalic and atraumatic. Conjunctivae are normal. No scleral icterus. Neck supple.  Cardiovascular: Normal rate, regular rhythm.  Pulmonary/chest: Effort normal and breath sounds normal. No wheezing, rales or rhonchi. Abdominal: Soft, nondistended, nontender.  There are no masses palpable. No hepatomegaly. DRE - Tia Alert standby CMA, 2 anal fissures, one posterior midline anal canal, one anterior midline anal cana - internal digital  exam not done due to pain Extremities: no edema Lymphadenopathy: No cervical adenopathy noted. Neurological: Alert and oriented to person place and time. Skin: Skin is warm and dry. No rashes noted. Psychiatric: Normal mood and affect. Behavior is normal.   ASSESSMENT AND PLAN: 33 year old female here for reassessment of the following:  Anal fissure - patient is status post hemorrhoidectomy with removal of skin tag, she has had development of rectal pain with bowel movements.  Perianal exam today shows to clear anal fissures which are the likely cause of her  symptoms.  I discussed what this is and management options.  I am recommending topical 0.125% nitroglycerin ointment, pea-sized amount applied 3 times a day for the next several weeks to help treat this.  If this is not helping her after few weeks I asked her to contact me for reassessment.  I told her that hemorrhoid banding is not indicated at this time, she has a skin tag that persists however would not recommend removal right now.  She agreed and can follow-up as symptoms persist  Nausea / vomiting / epigastric pain - intermittent severe episodes as outlined in previous notes.  She has had imaging with ultrasound, MRI, and EGD, all of which were normal.  I provided her Zofran to use as needed at the onset of an episode and to continue Protonix 40 mg once daily and see if that prevents any of her symptoms.  Thus far since she started this regimen she is not had an episode. If she has recurrence of episodes moving forward, may consider a trial of TCA.  She agreed  Fatty liver / elevated ALT  - likely due to fatty liver noted on prior imaging, have counseled her previously to minimize alcohol use and try to exercise more frequently.  We will continue to trend liver enzymes, elevation is quite mild.  She was vaccinated to hepatitis A  Renal cyst - noted on prior MRI, radiology recommends consideration for repeat imaging in February.  Ileene Patrick, MD [facility]

## 2019-07-17 NOTE — Progress Notes (Unsigned)
Nitroglycerin ointment sent to [facility]

## 2019-07-22 ENCOUNTER — Other Ambulatory Visit: Payer: Managed Care, Other (non HMO) | Admitting: Nurse Practitioner

## 2019-07-25 ENCOUNTER — Other Ambulatory Visit: Payer: Self-pay

## 2019-07-25 ENCOUNTER — Ambulatory Visit: Payer: Managed Care, Other (non HMO) | Attending: Internal Medicine

## 2019-07-25 ENCOUNTER — Other Ambulatory Visit: Payer: Managed Care, Other (non HMO)

## 2019-07-25 DIAGNOSIS — Z20822 Contact with and (suspected) exposure to covid-19: Secondary | ICD-10-CM

## 2019-07-26 LAB — NOVEL CORONAVIRUS, NAA: SARS-CoV-2, NAA: NOT DETECTED

## 2019-08-15 ENCOUNTER — Telehealth: Payer: Self-pay | Admitting: Family Medicine

## 2019-08-15 NOTE — Telephone Encounter (Signed)
Pt is a former [facility] patient and needs a refill on her

## 2019-08-15 NOTE — Telephone Encounter (Signed)
Cancel last note sent, patient discovered turning the phone call she had 2 more refills remaining. Patient also stated she was going to look for another provider near her home in [place].

## 2019-08-16 ENCOUNTER — Telehealth: Payer: Self-pay | Admitting: Gastroenterology

## 2019-08-16 ENCOUNTER — Other Ambulatory Visit (INDEPENDENT_AMBULATORY_CARE_PROVIDER_SITE_OTHER): Payer: Managed Care, Other (non HMO)

## 2019-08-16 ENCOUNTER — Other Ambulatory Visit: Payer: Self-pay

## 2019-08-16 DIAGNOSIS — R112 Nausea with vomiting, unspecified: Secondary | ICD-10-CM | POA: Diagnosis not present

## 2019-08-16 DIAGNOSIS — R1084 Generalized abdominal pain: Secondary | ICD-10-CM | POA: Diagnosis not present

## 2019-08-16 LAB — CBC WITH DIFFERENTIAL/PLATELET
Basophils Absolute: 0 10*3/uL (ref 0.0–0.1)
Basophils Relative: 0.2 % (ref 0.0–3.0)
Eosinophils Absolute: 0 10*3/uL (ref 0.0–0.7)
Eosinophils Relative: 0.2 % (ref 0.0–5.0)
HCT: 45.6 % (ref 36.0–46.0)
Hemoglobin: 15.5 g/dL — ABNORMAL HIGH (ref 12.0–15.0)
Lymphocytes Relative: 29.4 % (ref 12.0–46.0)
Lymphs Abs: 4.9 10*3/uL — ABNORMAL HIGH (ref 0.7–4.0)
MCHC: 33.9 g/dL (ref 30.0–36.0)
MCV: 86 fl (ref 78.0–100.0)
Monocytes Absolute: 1.2 10*3/uL — ABNORMAL HIGH (ref 0.1–1.0)
Monocytes Relative: 7.2 % (ref 3.0–12.0)
Neutro Abs: 10.5 10*3/uL — ABNORMAL HIGH (ref 1.4–7.7)
Neutrophils Relative %: 63 % (ref 43.0–77.0)
Platelets: 269 10*3/uL (ref 150.0–400.0)
RBC: 5.3 Mil/uL — ABNORMAL HIGH (ref 3.87–5.11)
RDW: 13.3 % (ref 11.5–15.5)
WBC: 16.7 10*3/uL — ABNORMAL HIGH (ref 4.0–10.5)

## 2019-08-16 LAB — COMPREHENSIVE METABOLIC PANEL
ALT: 30 U/L (ref 0–35)
AST: 15 U/L (ref 0–37)
Albumin: 5 g/dL (ref 3.5–5.2)
Alkaline Phosphatase: 102 U/L (ref 39–117)
BUN: 19 mg/dL (ref 6–23)
CO2: 28 mEq/L (ref 19–32)
Calcium: 10.3 mg/dL (ref 8.4–10.5)
Chloride: 102 mEq/L (ref 96–112)
Creatinine, Ser: 0.81 mg/dL (ref 0.40–1.20)
GFR: 80.67 mL/min (ref >60.00)
Glucose, Bld: 91 mg/dL (ref 70–99)
Potassium: 3.5 mEq/L (ref 3.5–5.1)
Sodium: 141 mEq/L (ref 135–145)
Total Bilirubin: 1.8 mg/dL — ABNORMAL HIGH (ref 0.2–1.2)
Total Protein: 8 g/dL (ref 6.0–8.3)

## 2019-08-16 LAB — LIPASE: Lipase: 42 U/L (ref 11.0–59.0)

## 2019-08-16 LAB — AMYLASE: Amylase: 37 U/L (ref 27–131)

## 2019-08-16 NOTE — Telephone Encounter (Signed)
Patient called and states she started having sever abdominal pain about 4:30pm yesterday with vomiting that lasted until 1 am this morning. Had 7-8 episodes of vomiting. Said she has not vomited since then, but still has moderate abdominal pain. Said in her office visit with Dr. Adela Lank he asked her to call if she had these symptoms again, and he  might want to do some labs. Please advise

## 2019-08-16 NOTE — Telephone Encounter (Signed)
Pt would like to have the lab tests that Dr. Adela Lank recommended. She states that she is having sxs today and would like to have them done. Pls call her.

## 2019-08-16 NOTE — Telephone Encounter (Signed)
Called patient and she will come into our lab shortly for the labs

## 2019-08-16 NOTE — Telephone Encounter (Signed)
Thanks IT trainer. Can you have her go to the lab for CBC, CMET, amylase, lipase, if she can get there this AM - want to make sure these are normal. Thanks

## 2019-08-19 ENCOUNTER — Other Ambulatory Visit: Payer: Self-pay

## 2019-08-19 DIAGNOSIS — R1030 Lower abdominal pain, unspecified: Secondary | ICD-10-CM

## 2019-08-20 ENCOUNTER — Telehealth: Payer: Self-pay | Admitting: Gastroenterology

## 2019-08-20 NOTE — Telephone Encounter (Signed)
Okay that's fine with me, thanks

## 2019-08-20 NOTE — Telephone Encounter (Signed)
Patient called and wants to cancel her CT scheduled at [facility] on 08/22/19, because her Ins. Co[employer] Called and said she could get it done for 1/2 the price at [facility]. I let the patient know that it usually takes longer to get the results when it is done outside of the [facility] network, but she still wants it done there. I faxed the order to [facility] and cancelled the CT at [facility]

## 2019-08-21 ENCOUNTER — Other Ambulatory Visit: Payer: Self-pay

## 2019-08-21 ENCOUNTER — Telehealth: Payer: Self-pay

## 2019-08-21 MED ORDER — PREDNISONE 50 MG PO TABS: ORAL_TABLET | ORAL | 0 refills | Status: DC

## 2019-08-21 MED ORDER — DIPHENHYDRAMINE HCL 50 MG PO TABS: 50.0000 mg | ORAL_TABLET | Freq: Once | ORAL | 0 refills | Status: DC

## 2019-08-21 NOTE — Telephone Encounter (Signed)
Patient called and states since she has an allergy to Betadine and possibly Iodine,  [facility] is requiring her to have an order for  pre-med of Prednisone and Benadryl. Please advise

## 2019-08-21 NOTE — Telephone Encounter (Signed)
Adrianna from [facility] called and gave their protocol for 13 hour contrast prep. Prednisone-50mg  x 3 doses. (take 1 tab 13 hrs. before CT,  1 tab 7 hrs. Before,  take 1 tab 1 hr. Before)  Also Benadryl-50mg  x 1 (take 1 hr. Before CT). Sent to patient's pharmacy and gave instructions for taking

## 2019-08-21 NOTE — Telephone Encounter (Signed)
Yes we can give that to her, whatever their recommendation for pre-medication is we can prescribe it, if they can clarify dosing for you. Thanks.

## 2019-08-22 ENCOUNTER — Ambulatory Visit (HOSPITAL_COMMUNITY): Payer: Managed Care, Other (non HMO)

## 2019-08-22 ENCOUNTER — Encounter (HOSPITAL_COMMUNITY): Payer: Self-pay

## 2019-08-28 ENCOUNTER — Telehealth: Payer: Managed Care, Other (non HMO) | Admitting: Family

## 2019-08-28 ENCOUNTER — Encounter: Payer: Self-pay | Admitting: Gastroenterology

## 2019-08-28 DIAGNOSIS — B9689 Other specified bacterial agents as the cause of diseases classified elsewhere: Secondary | ICD-10-CM | POA: Diagnosis not present

## 2019-08-28 DIAGNOSIS — N760 Acute vaginitis: Secondary | ICD-10-CM

## 2019-08-28 MED ORDER — METRONIDAZOLE 500 MG PO TABS: 500.0000 mg | ORAL_TABLET | Freq: Two times a day (BID) | ORAL | 0 refills | Status: DC

## 2019-08-28 NOTE — Progress Notes (Signed)

## 2019-08-29 NOTE — Telephone Encounter (Signed)
CT results from 08-28-19 received from [facility] and placed on Armbruster's desk for review.

## 2019-08-30 ENCOUNTER — Telehealth: Payer: Self-pay | Admitting: Gastroenterology

## 2019-08-30 ENCOUNTER — Other Ambulatory Visit: Payer: Self-pay

## 2019-08-30 MED ORDER — NORTRIPTYLINE HCL 25 MG PO CAPS: 25.0000 mg | ORAL_CAPSULE | Freq: Every day | ORAL | 1 refills | Status: DC

## 2019-08-30 NOTE — Telephone Encounter (Signed)
Called patient and gave CT results. Sent order to patient's pharmacy for Nortriptyline and gave instructions for taking. Left her know we are planning to do a F/U US-pelvis to reassess the Left ovarian cyst seen on the CT. Scheduled office visit with Dr. Adela Lank on 10/03/19

## 2019-08-30 NOTE — Progress Notes (Signed)
no

## 2019-08-30 NOTE — Telephone Encounter (Signed)
CT scan results arrived - done 08/28/19 at [facility]  Findings: Suspected hepatic steatosis 2.1cm renal cyst which she has had noted previously 4.7cm ovarian cyst on the left ovary - radiology recommends consideration for pelvic US in 6 weeks to assess for resolution  Darral Dash can you let the patient know the results, I don't see anything obvious to account for her pain the other day. She continues to have these episodes of nausea / vomiting, poor PO intake, with upper and lower abdominal pain. I think trial of TCA may be reasonable to try and break the cycle. I would offer her Nortriptyline 25mg  q HS for 2 weeks, then increase to 50mg  q HS as tolerated. I am using this as a preventative measure as this can sometimes help. Please let her know this is an older antidepressant but we don't use it for that purposes, we use it for it's other effects, and this may help her. I also recommend a pelvic in 6 weeks to reassess the ovarian cyst. Thanks  I'd like to see her back in the office in 4-6 weeks for reassessment. Thanks

## 2019-09-11 ENCOUNTER — Telehealth: Payer: Self-pay | Admitting: Obstetrics and Gynecology

## 2019-09-11 NOTE — Telephone Encounter (Signed)
Patient scheduled 3/8 for nexplanon replacement with AMS/KP.

## 2019-09-16 NOTE — Telephone Encounter (Signed)
Noted. Will order to arrive by apt date/time. 

## 2019-09-23 ENCOUNTER — Other Ambulatory Visit: Payer: Self-pay

## 2019-09-23 MED ORDER — PANTOPRAZOLE SODIUM 40 MG PO TBEC: 40.0000 mg | DELAYED_RELEASE_TABLET | Freq: Every day | ORAL | 1 refills | Status: DC

## 2019-09-23 NOTE — Progress Notes (Signed)
rec'd fax request from [facility] for 90 day supply of pantoprazole.

## 2019-09-27 ENCOUNTER — Other Ambulatory Visit: Payer: Self-pay | Admitting: Family Medicine

## 2019-09-27 DIAGNOSIS — K649 Unspecified hemorrhoids: Secondary | ICD-10-CM

## 2019-09-27 DIAGNOSIS — K5900 Constipation, unspecified: Secondary | ICD-10-CM

## 2019-09-27 MED ORDER — DOCUSATE SODIUM 100 MG PO CAPS: 100.0000 mg | ORAL_CAPSULE | Freq: Two times a day (BID) | ORAL | 0 refills | Status: DC

## 2019-09-27 NOTE — Telephone Encounter (Signed)
Ok to refill 

## 2019-09-27 NOTE — Telephone Encounter (Signed)
Patient is a former Rebecca Davies patient and will be doing a TOC with Arvilla Market on 10/04/2019. Patient is out of her docusate sodium (COLACE) 100 MG capsule, please fill if possible. Thanks

## 2019-10-02 ENCOUNTER — Telehealth: Payer: Self-pay

## 2019-10-02 ENCOUNTER — Other Ambulatory Visit: Payer: Self-pay

## 2019-10-02 DIAGNOSIS — N83202 Unspecified ovarian cyst, left side: Secondary | ICD-10-CM

## 2019-10-02 NOTE — Telephone Encounter (Signed)
Scheduled  US-pelvis at [facility] on 10/11/19. Patient to arrive at 3:15pm and have a full bladder. This is for reassessment on a left ovarian cyst seen on 08/29/19 CT

## 2019-10-02 NOTE — Telephone Encounter (Signed)
-----   Message from Antony Blackbird, RN sent at 08/30/2019  9:24 AM EST ----- Schedule US-pelvic, 6 week F/U to reassess Left ovarian cyst seen on CT-08/28/19 done at [facility]

## 2019-10-03 ENCOUNTER — Other Ambulatory Visit (INDEPENDENT_AMBULATORY_CARE_PROVIDER_SITE_OTHER): Payer: Managed Care, Other (non HMO)

## 2019-10-03 ENCOUNTER — Ambulatory Visit: Payer: Managed Care, Other (non HMO) | Admitting: Gastroenterology

## 2019-10-03 VITALS — BP 98/52 | HR 125 | Temp 98.9°F | Ht 61.0 in | Wt 146.0 lb

## 2019-10-03 DIAGNOSIS — R112 Nausea with vomiting, unspecified: Secondary | ICD-10-CM

## 2019-10-03 DIAGNOSIS — N83202 Unspecified ovarian cyst, left side: Secondary | ICD-10-CM

## 2019-10-03 DIAGNOSIS — R109 Unspecified abdominal pain: Secondary | ICD-10-CM

## 2019-10-03 DIAGNOSIS — K760 Fatty (change of) liver, not elsewhere classified: Secondary | ICD-10-CM | POA: Diagnosis not present

## 2019-10-03 LAB — CBC WITH DIFFERENTIAL/PLATELET
Basophils Absolute: 0 10*3/uL (ref 0.0–0.1)
Basophils Relative: 0.4 % (ref 0.0–3.0)
Eosinophils Absolute: 0.1 10*3/uL (ref 0.0–0.7)
Eosinophils Relative: 1.1 % (ref 0.0–5.0)
HCT: 44.5 % (ref 36.0–46.0)
Hemoglobin: 15.3 g/dL — ABNORMAL HIGH (ref 12.0–15.0)
Lymphocytes Relative: 35.5 % (ref 12.0–46.0)
Lymphs Abs: 4 10*3/uL (ref 0.7–4.0)
MCHC: 34.4 g/dL (ref 30.0–36.0)
MCV: 86.4 fl (ref 78.0–100.0)
Monocytes Absolute: 0.8 10*3/uL (ref 0.1–1.0)
Monocytes Relative: 7.2 % (ref 3.0–12.0)
Neutro Abs: 6.3 10*3/uL (ref 1.4–7.7)
Neutrophils Relative %: 55.8 % (ref 43.0–77.0)
Platelets: 214 10*3/uL (ref 150.0–400.0)
RBC: 5.16 Mil/uL — ABNORMAL HIGH (ref 3.87–5.11)
RDW: 13.1 % (ref 11.5–15.5)
WBC: 11.2 10*3/uL — ABNORMAL HIGH (ref 4.0–10.5)

## 2019-10-03 LAB — HEPATIC FUNCTION PANEL
ALT: 61 U/L — ABNORMAL HIGH (ref 0–35)
AST: 32 U/L (ref 0–37)
Albumin: 4.5 g/dL (ref 3.5–5.2)
Alkaline Phosphatase: 103 U/L (ref 39–117)
Bilirubin, Direct: 0.2 mg/dL (ref 0.0–0.3)
Total Bilirubin: 1.4 mg/dL — ABNORMAL HIGH (ref 0.2–1.2)
Total Protein: 7.7 g/dL (ref 6.0–8.3)

## 2019-10-03 MED ORDER — NORTRIPTYLINE HCL 25 MG PO CAPS: 50.0000 mg | ORAL_CAPSULE | Freq: Every day | ORAL | 1 refills | Status: DC

## 2019-10-03 MED ORDER — PROMETHAZINE HCL 25 MG PO TABS: ORAL_TABLET | ORAL | 0 refills | Status: DC

## 2019-10-03 NOTE — Patient Instructions (Addendum)
cbcIf you are age 35 or older, your body mass index should be between 23-30. Your Body mass index is 27.59 kg/m. If this is out of the aforementioned range listed, please consider follow up with your Primary Care Provider.  If you are age 48 or younger, your body mass index should be between 19-25. Your Body mass index is 27.59 kg/m. If this is out of the aformentioned range listed, please consider follow up with your Primary Care Provider.   Please go to the lab in the basement of our building to have lab work done as you leave today. Hit "B" for basement when you get on the elevator.  When the doors open the lab is on your left.  We will call you with the results. Thank you.  We have sent the following medications to your pharmacy for you to pick up at your convenience: Phenergan 25 mg: Take one by mouth at onset of nausea  Nortriptyline 50 mg: Take daily at bedtime  Please follow up in 6 months.  Thank you for entrusting me with your care and for choosing [facility], Dr. Ileene Patrick

## 2019-10-03 NOTE — Progress Notes (Signed)
HPI :  34 year old female here for a follow-up visit for abdominal pain and nausea with vomiting.  See prior notes for full details of her case.  She has had episodic severe episodes of upper and lower abdominal pain associated with refractory nausea and vomiting with inability to tolerate p.o. for several hours at a time.  This has occurred every 3 months or so for the past 4 years she thinks, although much more frequent over the past 2 years.  She is had an extensive evaluation so far including a negative right upper quadrant ultrasound without gallstones.  MRI abdomen showed a benign renal cyst but no pathology otherwise.  She had a prior endoscopy with me which showed some mild gastritis but negative for H. pylori.  She was given a trial of Protonix and Zofran.  Unfortunately this did not prevent further episodes.  She had another severe episode last month.  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  She did have a leukocytosis of 16 without fevers.  Episode resolved on its own after several hours.  We try to obtain a CT scan while still symptomatic although that is very difficult to do aside from being in the emergency room.  Her CT scan was done at [facility], there was no clear pathology noted to cause her symptom other than a 4.7 cm ovarian cyst for which radiology has recommended follow-up pelvic ultrasound.  She has that pending for next week.  She denies any alcohol use, no tobacco, no marijuana or other illicit substances.  There are no clear triggers to the symptoms.  She has acute severe epigastric and lower abdominal pain, with refractory nausea vomiting for several hours with subsequent resolution.  In between episodes she has absolutely no symptoms.  She is eating well has no nausea at baseline, no abdominal pain at baseline.  She is on Nexplanon and does not have menstrual cycles.  She denies any blood in  her stools problems no problems with her bowels at all.  She denies any diarrhea associated with this.  She continues to take Protonix 40 mg a day and is not really having much of any reflux.  She is a bit frustrated as these episodes have continued to occur.  We we placed her on a trial of nortriptyline 25 mg nightly since the last episode and increase to 50 mg nightly.  Initially she had some drowsiness with this but has since tolerated it quite well.  It is too soon to say if this has helped prevent an episode yet but she is hopeful.  She is been on it about 6 weeks or so perhaps.  She previously had a history of hemorrhoids status post hemorrhoidectomy as well as history of anal fissure which she had at the last visit.  She was treated with topical nitroglycerin and denies any problems with that at this point.  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 - Patchy mildly erythematous mucosa was found in the gastric fundus without ulceration. - The exam of the stomach was otherwise normal. - Biopsies were taken with a cold forceps in the gastric fundus, in the gastric body,  at the incisura and in the gastric antrum for Helicobacter pylori testing. - The duodenal bulb and second portion of the duodenum were normal.  CT scan 08/28/19 at [facility] Findings: Suspected hepatic steatosis 2.1cm renal cyst which she has had noted previously 4.7cm ovarian cyst on the left ovary - radiology recommends consideration for pelvic US in 6 weeks to assess for resolution   Past Medical History:  Diagnosis Date  . Anxiety   . Arthritis    FINGER, ELBOW  . Chicken pox   . Depression   . GERD (gastroesophageal reflux disease)    OCC- NO MEDS  . Migraine    H/O MIGRAINES      Past Surgical History:  Procedure Laterality Date  . HEMORRHOID SURGERY N/A 04/05/2019   Procedure: HEMORRHOIDECTOMY, EXCISION ANAL SKIN TAG;  Surgeon: Duanne Guess, MD;  Location: [facility] ORS;  Service: General;  Laterality: N/A;  . WISDOM TOOTH EXTRACTION  2013?   Family History  Problem Relation Age of Onset  . Cancer Mother        ovarian  . Hyperlipidemia Mother   . Mental illness Mother        anxiety and depression  . Alcohol abuse Father   . Hypertension Maternal Grandmother   . Heart disease Maternal Grandmother   . Stroke Maternal Grandmother   . Mental illness Maternal Grandmother   . Alcohol abuse Paternal Grandfather   . Cancer Paternal Grandfather   . Breast cancer Paternal Grandmother   . Breast cancer Paternal Great-grandmother   . Breast cancer Paternal Aunt   . Stomach cancer Neg Hx   . Rectal cancer Neg Hx   . Colon cancer Neg Hx   . Esophageal cancer Neg Hx    Social History   Tobacco Use  . Smoking status: Never Smoker  . Smokeless tobacco: Never Used  Substance Use Topics  . Alcohol use: Yes    Alcohol/week: 1.0 standard drinks    Types: 1 Standard drinks or equivalent per week    Comment: 1 glass of wine every few weeks.   . Drug use: No   Current Outpatient Medications  Medication Sig Dispense Refill  . ALPRAZolam (XANAX) 0.5 MG tablet Take 1 tablet (0.5 mg total) by mouth at bedtime as needed for anxiety. 30 tablet 1  . AMBULATORY NON FORMULARY MEDICATION Medication Name: Nitroglycerine ointment 0.125 %  Apply a pea sized amount internally three times daily for 4 to 6 weeks Dispense 30 GM zero refill 30 g 1  . docusate sodium (COLACE) 100 MG capsule Take 1 capsule (100 mg total) by mouth 2 (two) times daily. 60 capsule 0  . nortriptyline (PAMELOR) 25 MG capsule Take 1 capsule (25 mg total) by mouth at bedtime. Take  1 capsule at bedtime for 2 weeks, then take 2 capsules at bedtime (as tolerated) 46 capsule 1  . ondansetron (ZOFRAN-ODT)  4 MG disintegrating tablet Take 4 mg by mouth every 8 (eight) hours as needed for nausea or vomiting.    . pantoprazole (PROTONIX) 40 MG tablet Take 1 tablet (40 mg total) by mouth daily. 90 tablet 1   Current Facility-Administered Medications  Medication Dose Route Frequency Provider Last Rate Last Admin  . etonogestrel (NEXPLANON) implant 68 mg  68 mg Subdermal Once Farrel Conners, CNM       Allergies  Allergen Reactions  . Betadine [Povidone Iodine] Rash  . Shellfish Allergy Swelling and Rash     Review of Systems: All systems reviewed and  negative except where noted in HPI.   Lab Results  Component Value Date   WBC 16.7 (H) 08/16/2019   HGB 15.5 (H) 08/16/2019   HCT 45.6 08/16/2019   MCV 86.0 08/16/2019   PLT 269.0 08/16/2019    Lab Results  Component Value Date   CREATININE 0.81 08/16/2019   BUN 19 08/16/2019   NA 141 08/16/2019   K 3.5 08/16/2019   CL 102 08/16/2019   CO2 28 08/16/2019    Lab Results  Component Value Date   ALT 30 08/16/2019   AST 15 08/16/2019   ALKPHOS 102 08/16/2019   BILITOT 1.8 (H) 08/16/2019     Physical Exam: BP (!) 98/52   Pulse (!) 125   Temp 98.9 F (37.2 C)   Ht 5\' 1"  (1.549 m)   Wt 146 lb (66.2 kg)   BMI 27.59 kg/m  Constitutional: Pleasant,well-developed, female in no acute distress. HEENT: Normocephalic and atraumatic. Conjunctivae are normal. No scleral icterus. Neck supple.  Cardiovascular: Normal rate, regular rhythm.  Pulmonary/chest: Effort normal and breath sounds normal. No wheezing, rales or rhonchi. Abdominal: Soft, nondistended, nontender.  There are no masses palpable. Extremities: no edema Lymphadenopathy: No cervical adenopathy noted. Neurological: Alert and oriented to person place and time. Skin: Skin is warm and dry. No rashes noted. Psychiatric: Normal mood and affect. Behavior is normal.   ASSESSMENT AND PLAN: 34 year old female here for reassessment of the following:  Episodic abdominal  pain with nausea and vomiting - as above, severe episodic abdominal pain associated with refractory nausea vomiting which lasts several hours, about 1 day and then resolves.  No symptoms in between episodes.  We have not been able to obtain imaging at exact time of an episode however MRI and CT scans for this issue has not shown any concerning pathology or cause.  Endoscopy without concerning pathology.  Ultrasound without any gallstones.  She does not have any baseline biliary colic.  At the last episode she did have a leukocytosis but otherwise no significant abnormalities otherwise.  It is possible she has cyclical vomiting syndrome and we discussed this possibility versus other more rare disorders.  We have tried to place her on a prophylactic regimen of TCA, she has been started on nortriptyline and appears to be tolerating it well.  We will see if this can help prevent future episodes although it is too soon to tell right now.  We will continue this dosed at 50 mg nightly. If she has another episode that is starting, recommend Phenergan 25 mg p.o. at onset, I gave her a few of these today.  Otherwise if the nortriptyline is ineffective we may consider trial of sumatriptan for abortive therapy and consideration of working up other more rare etiologies.  I will see her back in 6 months for routine follow-up if not sooner if she has another recurrence or episode that bothers her.  She will continue Protonix for now until we have better control of symptoms given history of gastritis.  She agreed  Ovarian cyst - noted on CT scan during last episode, will check a pelvic ultrasound for interval assessment per radiology recommendations.  Fatty liver - noted incidentally on imaging.  Previously had some elevated ALT although this is more recently normal.  She did have an isolated elevation in bilirubin, will send LFTs to recheck this, to possible she has Gilbert's. Will also resend CBC to ensure WBC has normalized.  She agreed.   I spent 35 minutes of time,  including in depth chart review, independent review of results as outlined above, communicating results with the patient directly, face-to-face time with the patient, coordinating care, and ordering studies and medications as appropriate, and documenting this encounter.   Malibu Cellar, MD [facility]

## 2019-10-04 ENCOUNTER — Telehealth: Payer: Managed Care, Other (non HMO) | Admitting: Nurse Practitioner

## 2019-10-07 ENCOUNTER — Other Ambulatory Visit: Payer: Self-pay

## 2019-10-08 ENCOUNTER — Telehealth: Payer: Managed Care, Other (non HMO) | Admitting: Nurse Practitioner

## 2019-10-08 ENCOUNTER — Encounter: Payer: Self-pay | Admitting: Nurse Practitioner

## 2019-10-08 ENCOUNTER — Other Ambulatory Visit: Payer: Self-pay

## 2019-10-08 VITALS — Ht 61.0 in | Wt 146.0 lb

## 2019-10-08 DIAGNOSIS — R300 Dysuria: Secondary | ICD-10-CM | POA: Diagnosis not present

## 2019-10-08 DIAGNOSIS — N390 Urinary tract infection, site not specified: Secondary | ICD-10-CM

## 2019-10-08 DIAGNOSIS — Z87440 Personal history of urinary (tract) infections: Secondary | ICD-10-CM

## 2019-10-08 NOTE — Progress Notes (Deleted)
   Subjective:    Patient ID: Rebecca Davies, female    DOB: Oct 04, 1985, 34 y.o.   MRN: [id number]  HPI  34 yo comes in for virtual appt for dysuria and reports  recurrent UTI . Chart review shows:07/11/19: Urine Cx E coli  06/20/19 : Urine Cx neg.  She reports recent UTI with Cipro failure and completion of Bactrim for 10-day course for UTI per Acute Care-records not available. She had BV treated with Flagyl for 7 days on 08/28/2019.  Over the last few days, she has burning and urinary frequency and believes her urinary tract infection is back already. She had an E. Coli  urinary tract infection in December treated with 10 day course of Bactrim. She reports the plan was to refer to urology if she continued to have problems.  Patient is interested in urology referral.  Currently, she has urgency and burning. No  back pain hematuria, abdominal pain, nausea/ vomiting, fevers/ chills and she feels well.   She denies any vaginal complaints.  She has a GYN appointment set up for next Wednesday Monday with a Pap test.  She is going to get her Nexplanon implant changed as well.    She has experienced cyclical vomiting abdominal pain and had an unrevealing extensive GI work-up with imaging studies and labs.  She was placed on nortriptyline and is taking 50 mg at bedtime.  She is on Protonix, and has Zofran and Phenergan if needed.  She has not had another episode.  However, she says it is too soon as she usually had episodes every 3 months.  She does not believe the GI complaints are related to her urology UTIs.  Patient denies any illicit drug use.  She has a history of anxiety and has Xanax 0.5 mg to take as needed at bedtime.  She says that she has been working at home she has rarely had anxiety problems and has not been utilizing the Xanax.  She denies any concerns with depression or anxiety at this time.   Review of Systems     Objective:   Physical Exam        Assessment & Plan:

## 2019-10-09 ENCOUNTER — Other Ambulatory Visit: Payer: Managed Care, Other (non HMO)

## 2019-10-09 DIAGNOSIS — R300 Dysuria: Secondary | ICD-10-CM

## 2019-10-09 NOTE — Progress Notes (Signed)
Virtual Visit via Video Note  I connected with Rebecca Davies on 10/09/19 at  2:00 PM EST by a video enabled telemedicine application and verified that I am speaking with the correct person using two identifiers.  Location: Patient: Home Provider: Work   I discussed the limitations of evaluation and management by telemedicine and the availability of in person appointments. The patient expressed understanding and agreed to proceed.  History of Present Illness: 34 yo comes in for virtual appt for dysuria and reports  recurrent UTI . Chart review shows:07/11/19: Urine Cx E coli  06/20/19 : Urine Cx neg.  She reports recent UTI with Cipro failure and completion of Bactrim for 10-day course for UTI per Acute Care-records not available. She had BV treated with Flagyl for 7 days on 08/28/2019.  Over the last few days, she has burning and urinary frequency and believes her urinary tract infection is back already. She had an E. Coli  urinary tract infection in December treated with 10 day course of Bactrim. She reports the plan was to refer to urology if she continued to have problems.  Patient is interested in urology referral.  Currently, she has urgency and burning. No  back pain hematuria, abdominal pain, nausea/ vomiting, fevers/ chills and she feels well.   She denies any vaginal complaints.  She has a GYN appointment set up for next Wednesday Monday with a Pap test.  She is going to get her Nexplanon implant changed as well.    She has experienced cyclical vomiting abdominal pain and had an unrevealing extensive GI work-up with imaging studies and labs.  She was placed on nortriptyline and is taking 50 mg at bedtime.  She is on Protonix, and has Zofran and Phenergan if needed.  She has not had another episode.  However, she says it is too soon as she usually had episodes every 3 months.  She does not believe the GI complaints are related to her urology UTIs.  Patient denies any illicit drug  use.  She has a history of anxiety and has Xanax 0.5 mg to take as needed at bedtime.  She says that she has been working at home she has rarely had anxiety problems and has not been utilizing the Xanax.  She denies any concerns with depression or anxiety at this time.   Past Medical History:  Diagnosis Date  . Anxiety   . Arthritis    FINGER, ELBOW  . Chicken pox   . Depression   . GERD (gastroesophageal reflux disease)    OCC- NO MEDS  . Migraine    H/O MIGRAINES     Past Surgical History:  Procedure Laterality Date  . HEMORRHOID SURGERY N/A 04/05/2019   Procedure: HEMORRHOIDECTOMY, EXCISION ANAL SKIN TAG;  Surgeon: Duanne Guess, MD;  Location: [facility] ORS;  Service: General;  Laterality: N/A;  . WISDOM TOOTH EXTRACTION  2013?    Family History  Problem Relation Age of Onset  . Cancer Mother        ovarian  . Hyperlipidemia Mother   . Mental illness Mother        anxiety and depression  . Alcohol abuse Father   . Hypertension Maternal Grandmother   . Heart disease Maternal Grandmother   . Stroke Maternal Grandmother   . Mental illness Maternal Grandmother   . Alcohol abuse Paternal Grandfather   . Cancer Paternal Grandfather   . Breast cancer Paternal Grandmother   . Breast cancer Paternal Great-grandmother   .  Breast cancer Paternal Aunt   . Stomach cancer Neg Hx   . Rectal cancer Neg Hx   . Colon cancer Neg Hx   . Esophageal cancer Neg Hx     Allergies  Allergen Reactions  . Betadine [Povidone Iodine] Rash  . Shellfish Allergy Swelling and Rash    Current Outpatient Medications on File Prior to Visit  Medication Sig Dispense Refill  . ALPRAZolam (XANAX) 0.5 MG tablet Take 1 tablet (0.5 mg total) by mouth at bedtime as needed for anxiety. 30 tablet 1  . AMBULATORY NON FORMULARY MEDICATION Medication Name: Nitroglycerine ointment 0.125 %  Apply a pea sized amount internally three times daily for 4 to 6 weeks Dispense 30 GM zero refill 30 g 1  .  cyclobenzaprine (FLEXERIL) 5 MG tablet Take 5 mg by mouth 3 (three) times daily as needed for muscle spasms.    Marland Kitchen docusate sodium (COLACE) 100 MG capsule Take 1 capsule (100 mg total) by mouth 2 (two) times daily. 60 capsule 0  . NITROGLYCERIN PO Take by mouth once.    . nortriptyline (PAMELOR) 25 MG capsule Take 2 capsules (50 mg total) by mouth at bedtime. 90 capsule 1  . ondansetron (ZOFRAN-ODT) 4 MG disintegrating tablet Take 4 mg by mouth every 8 (eight) hours as needed for nausea or vomiting.    . pantoprazole (PROTONIX) 40 MG tablet Take 1 tablet (40 mg total) by mouth daily. 90 tablet 1  . promethazine (PHENERGAN) 25 MG tablet Take one tablet by mouth at onset of nausea 10 tablet 0   Current Facility-Administered Medications on File Prior to Visit  Medication Dose Route Frequency Provider Last Rate Last Admin  . etonogestrel (NEXPLANON) implant 68 mg  68 mg Subdermal Once Dalia Heading, CNM        Ht 5\' 1"  (1.549 m)   Wt 146 lb (66.2 kg)   BMI 27.59 kg/m   Review of Systems  Constitutional: Negative for fatigue and fever.  HENT: Negative for congestion and rhinorrhea.   Respiratory: Negative for cough, chest tightness and shortness of breath.   Gastrointestinal:       No current GI complaints.   Genitourinary: Positive for dysuria. Negative for flank pain, pelvic pain and vaginal discharge.  Musculoskeletal: Negative.   Skin: Negative.      Observations/Objective:  Gen: Awake, alert, no acute distress Resp: Breathing is even and non-labored Psych: calm/pleasant demeanor Neuro: Alert and Oriented x 3, + facial symmetry, speech is clear.  Assessment and Plan:  Dysuria: Lab test tomorrow.  Patient reports frequent urinary tract infections requesting referral to urology. She has an upcoming GYN appt.   Follow Up Instructions:   I discussed the assessment and treatment plan with the patient. The patient was provided an opportunity to ask questions and all were  answered. The patient agreed with the plan and demonstrated an understanding of the instructions.   The patient was advised to call back or seek an in-person evaluation if the symptoms worsen or if the condition fails to improve as anticipated.  I provided 20 minutes of non-face-to-face time during this encounter.   Denice Paradise, NP

## 2019-10-10 ENCOUNTER — Other Ambulatory Visit: Payer: Self-pay | Admitting: Nurse Practitioner

## 2019-10-10 ENCOUNTER — Telehealth: Payer: Self-pay | Admitting: Lab

## 2019-10-10 LAB — URINALYSIS, ROUTINE W REFLEX MICROSCOPIC
Bilirubin Urine: NEGATIVE
Hgb urine dipstick: NEGATIVE
Ketones, ur: NEGATIVE
Nitrite: NEGATIVE
RBC / HPF: NONE SEEN (ref 0–?)
Specific Gravity, Urine: 1.025 (ref 1.000–1.030)
Total Protein, Urine: NEGATIVE
Urine Glucose: NEGATIVE
Urobilinogen, UA: 0.2 (ref 0.0–1.0)
pH: 6 (ref 5.0–8.0)

## 2019-10-10 MED ORDER — CIPROFLOXACIN HCL 500 MG PO TABS: 500.0000 mg | ORAL_TABLET | Freq: Two times a day (BID) | ORAL | 0 refills | Status: DC

## 2019-10-10 NOTE — Progress Notes (Signed)
UA looks positive. Culture is pending. Cipro ordered. Urology consult requested.

## 2019-10-10 NOTE — Telephone Encounter (Signed)
Called Pt No answer, left a VM to call office.  

## 2019-10-10 NOTE — Addendum Note (Signed)
Addended by: Amedeo Kinsman A on: 10/10/2019 03:05 PM   Modules accepted: Orders

## 2019-10-11 ENCOUNTER — Ambulatory Visit (HOSPITAL_COMMUNITY)
Admission: RE | Admit: 2019-10-11 | Discharge: 2019-10-11 | Disposition: A | Discharge status: Home / Self Care | Payer: Managed Care, Other (non HMO) | Source: Ambulatory Visit | Attending: Gastroenterology | Admitting: Gastroenterology

## 2019-10-11 ENCOUNTER — Other Ambulatory Visit: Payer: Self-pay

## 2019-10-11 DIAGNOSIS — N83202 Unspecified ovarian cyst, left side: Secondary | ICD-10-CM | POA: Diagnosis not present

## 2019-10-11 LAB — URINE CULTURE

## 2019-10-14 ENCOUNTER — Other Ambulatory Visit (HOSPITAL_COMMUNITY)
Admission: RE | Admit: 2019-10-14 | Discharge: 2019-10-14 | Disposition: A | Discharge status: Home / Self Care | Payer: Managed Care, Other (non HMO) | Source: Ambulatory Visit | Attending: Obstetrics and Gynecology | Admitting: Obstetrics and Gynecology

## 2019-10-14 ENCOUNTER — Encounter: Payer: Self-pay | Admitting: Obstetrics and Gynecology

## 2019-10-14 ENCOUNTER — Telehealth: Payer: Self-pay

## 2019-10-14 ENCOUNTER — Other Ambulatory Visit: Payer: Self-pay

## 2019-10-14 ENCOUNTER — Ambulatory Visit (INDEPENDENT_AMBULATORY_CARE_PROVIDER_SITE_OTHER): Payer: Managed Care, Other (non HMO) | Admitting: Obstetrics and Gynecology

## 2019-10-14 VITALS — BP 126/90 | HR 118 | Ht 61.0 in | Wt 150.0 lb

## 2019-10-14 DIAGNOSIS — Z1239 Encounter for other screening for malignant neoplasm of breast: Secondary | ICD-10-CM

## 2019-10-14 DIAGNOSIS — Z3046 Encounter for surveillance of implantable subdermal contraceptive: Secondary | ICD-10-CM

## 2019-10-14 DIAGNOSIS — Z124 Encounter for screening for malignant neoplasm of cervix: Secondary | ICD-10-CM | POA: Diagnosis present

## 2019-10-14 DIAGNOSIS — Z113 Encounter for screening for infections with a predominantly sexual mode of transmission: Secondary | ICD-10-CM

## 2019-10-14 DIAGNOSIS — Z01411 Encounter for gynecological examination (general) (routine) with abnormal findings: Secondary | ICD-10-CM | POA: Diagnosis not present

## 2019-10-14 DIAGNOSIS — N83202 Unspecified ovarian cyst, left side: Secondary | ICD-10-CM | POA: Diagnosis not present

## 2019-10-14 DIAGNOSIS — Z01419 Encounter for gynecological examination (general) (routine) without abnormal findings: Secondary | ICD-10-CM

## 2019-10-14 NOTE — Telephone Encounter (Signed)
Pt returning your call

## 2019-10-14 NOTE — Telephone Encounter (Signed)
Called patient and left message to please call back. Checking if she received  The My Chart message from Dr. Adela Lank giving her [place] results.

## 2019-10-14 NOTE — Progress Notes (Signed)
Gynecology Annual Exam   PCP: Rebecca Regal, NP  Chief Complaint:  Chief Complaint  Patient presents with  . Gynecologic Exam    Nexplanon removal/reinsert    History of Present Illness: Patient is a 34 y.o. G1P1001 presents for annual exam. The patient has no complaints today.   LMP: No LMP recorded. Patient has had an implant. Amenorrhea on nexplanon  The patient is sexually active. She currently uses Nexplanon for contraception. She denies dyspareunia.  The patient does perform self breast exams.  There is no notable family history of breast or ovarian cancer in her family.  The patient wears seatbelts: yes.   The patient has regular exercise: not asked.    The patient denies current symptoms of depression.    Recent ultrasound showing simple 3.3cm left ovarian cyst on 10/11/2019  Review of Systems: Review of Systems  Constitutional: Negative for chills and fever.  HENT: Negative for congestion.   Respiratory: Negative for cough and shortness of breath.   Cardiovascular: Negative for chest pain and palpitations.  Gastrointestinal: Negative for abdominal pain, constipation, diarrhea, heartburn, nausea and vomiting.  Genitourinary: Negative for dysuria, frequency and urgency.  Skin: Negative for itching and rash.  Neurological: Negative for dizziness and headaches.  Endo/Heme/Allergies: Negative for polydipsia.  Psychiatric/Behavioral: Negative for depression.    Past Medical History:  Patient Active Problem List   Diagnosis Date Noted  . Anal skin tag   . Grade III hemorrhoids   . Left hip pain 05/10/2017  . 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 Routine general medical examination at a health care facility 09/04/2014  . Anxiety and depression 09/04/2014    Past Surgical History:  Past Surgical History:  Procedure Laterality Date  . HEMORRHOID SURGERY N/A 04/05/2019   Procedure: HEMORRHOIDECTOMY, EXCISION ANAL SKIN TAG;  Surgeon: Fredirick Maudlin, MD;  Location:  [facility] ORS;  Service: General;  Laterality: N/A;  . WISDOM TOOTH EXTRACTION  2013?    Gynecologic History:  No LMP recorded. Patient has had an implant. Contraception:10/26/2016 Nexplanon Last Pap: Results were:09/19/2018 low-grade squamous intraepithelial neoplasia (LGSIL - encompassing HPV,mild dysplasia,CIN I)   10/29/2018  Colposcopy negative  Obstetric History: G1P1001  Family History:  Family History  Problem Relation Age of Onset  . Cancer Mother        ovarian  . Hyperlipidemia Mother   . Mental illness Mother        anxiety and depression  . Alcohol abuse Father   . Hypertension Maternal Grandmother   . Heart disease Maternal Grandmother   . Stroke Maternal Grandmother   . Mental illness Maternal Grandmother   . Pancreatic cancer Maternal Grandmother 77  . Alcohol abuse Paternal Grandfather   . Cancer Paternal Grandfather   . Breast cancer Paternal Grandmother   . Breast cancer Paternal Great-grandmother   . Breast cancer Paternal Aunt   . Stomach cancer Neg Hx   . Rectal cancer Neg Hx   . Colon cancer Neg Hx   . Esophageal cancer Neg Hx     Social History:  Social History   Socioeconomic History  . Marital status: Single    Spouse name: Not on file  . Number of children: Not on file  . Years of education: Not on file  . Highest education level: Not on file  Occupational History  . Not on file  Tobacco Use  . Smoking status: Never Smoker  . Smokeless tobacco: Never Used  Substance and Sexual Activity  . Alcohol  use: Yes    Alcohol/week: 1.0 standard drinks    Types: 1 Standard drinks or equivalent per week    Comment: 1 glass of wine every few weeks.   . Drug use: No  . Sexual activity: Yes    Partners: Male    Birth control/protection: Implant    Comment: Husband  Other Topics Concern  . Not on file  Social History Narrative   From [place] and residing in [place]   Lives with husband   1 Son (5 yo)   1 cat in the house   [employer]  doing order entry    Enjoys movies, going to eat, beauty products    Social Determinants of Health   Financial Resource Strain:   . Difficulty of Paying Living Expenses: Not on file  Food Insecurity:   . Worried About Programme researcher, broadcasting/film/video in the Last Year: Not on file  . Ran Out of Food in the Last Year: Not on file  Transportation Needs:   . Lack of Transportation (Medical): Not on file  . Lack of Transportation (Non-Medical): Not on file  Physical Activity:   . Days of Exercise per Week: Not on file  . Minutes of Exercise per Session: Not on file  Stress:   . Feeling of Stress : Not on file  Social Connections:   . Frequency of Communication with Friends and Family: Not on file  . Frequency of Social Gatherings with Friends and Family: Not on file  . Attends Religious Services: Not on file  . Active Member of Clubs or Organizations: Not on file  . Attends Banker Meetings: Not on file  . Marital Status: Not on file  Intimate Partner Violence:   . Fear of Current or Ex-Partner: Not on file  . Emotionally Abused: Not on file  . Physically Abused: Not on file  . Sexually Abused: Not on file    Allergies:  Allergies  Allergen Reactions  . Betadine [Povidone Iodine] Rash  . Shellfish Allergy Swelling and Rash    Medications: Prior to Admission medications   Medication Sig Start Date End Date Taking? Authorizing Provider  ALPRAZolam Prudy Feeler) 0.5 MG tablet Take 1 tablet (0.5 mg total) by mouth at bedtime as needed for anxiety. 06/20/19  Yes Guse, Janna Arch, FNP  ciprofloxacin (CIPRO) 500 MG tablet Take 1 tablet (500 mg total) by mouth 2 (two) times daily. 10/10/19  Yes Theadore Nan, NP  cyclobenzaprine (FLEXERIL) 5 MG tablet Take 5 mg by mouth 3 (three) times daily as needed for muscle spasms.   Yes [provider]  docusate sodium (COLACE) 100 MG capsule Take 1 capsule (100 mg total) by mouth 2 (two) times daily. 09/27/19  Yes Glori Luis, MD   nortriptyline (PAMELOR) 25 MG capsule Take 2 capsules (50 mg total) by mouth at bedtime. 10/03/19  Yes Armbruster, Willaim Rayas, MD  ondansetron (ZOFRAN-ODT) 4 MG disintegrating tablet Take 4 mg by mouth every 8 (eight) hours as needed for nausea or vomiting.   Yes [provider]  pantoprazole (PROTONIX) 40 MG tablet Take 1 tablet (40 mg total) by mouth daily. 09/23/19  Yes Armbruster, Willaim Rayas, MD  promethazine (PHENERGAN) 25 MG tablet Take one tablet by mouth at onset of nausea 10/03/19  Yes Armbruster, Willaim Rayas, MD  AMBULATORY NON FORMULARY MEDICATION Medication Name: Nitroglycerine ointment 0.125 %  Apply a pea sized amount internally three times daily for 4 to 6 weeks Dispense 30 GM zero  refill 07/17/19   Armbruster, Willaim Rayas, MD  NITROGLYCERIN PO Take by mouth once.    [provider]    Physical Exam Vitals: Blood pressure 126/90, pulse (!) 118, height 5\' 1"  (1.549 m), weight 150 lb (68 kg).  General: NAD HEENT: normocephalic, anicteric Thyroid: no enlargement, no palpable nodules Pulmonary: No increased work of breathing, CTAB Cardiovascular: RRR, distal pulses 2+ Breast: Breast symmetrical, no tenderness, no palpable nodules or masses, no skin or nipple retraction present, no nipple discharge.  No axillary or supraclavicular lymphadenopathy. Abdomen: NABS, soft, non-tender, non-distended.  Umbilicus without lesions.  No hepatomegaly, splenomegaly or masses palpable. No evidence of hernia  Genitourinary:  External: Normal external female genitalia.  Normal urethral meatus, normal Bartholin's and Skene's glands.    Vagina: Normal vaginal mucosa, no evidence of prolapse.    Cervix: Grossly normal in appearance, no bleeding  Uterus: Non-enlarged, mobile, normal contour.  No CMT  Adnexa: ovaries non-enlarged, no adnexal masses  Rectal: deferred  Lymphatic: no evidence of inguinal lymphadenopathy Extremities: no edema, erythema, or tenderness Neurologic: Grossly  intact Psychiatric: mood appropriate, affect full  Female chaperone present for pelvic and breast  portions of the physical exam  GYNECOLOGY PROCEDURE NOTE  Implanon removal and reinsertion discussed in detail.  Risks of infection, bleeding, nerve injury all reviewed.  Patient understands risks and desires to proceed.  Verbal consent obtained.  Patient is certain she wants the implanon removed and replaced.    She understands that Nexplanon is a progesterone only therapy, and that patients often patients have irregular and unpredictable vaginal bleeding or amenorrhea. She understands that other side effects are possible related to systemic progesterone, including but not limited to, headaches, breast tenderness, nausea, and irritability. While effective at preventing pregnancy long acting reversible contraceptives do not prevent transmission of sexually transmitted diseases and use of barrier methods for this purpose was discussed. The placement procedure for Nexplanon was reviewed with the patient in detail including risks of nerve injury, infection, bleeding and injury to other muscles or tendons. She understands that the Nexplanon implant is good for 3 years and needs to be removed at the end of that time.  She understands that Nexplanon is an extremely effective option for contraception, with failure rate of <1%. This information is reviewed today and all questions were answered. Informed consent was obtained, both verbally and written.   The patient is healthy and has no contraindications to Implanon use.   Procedure: Patient placed in dorsal supine with left arm above head, elbow flexed at 90 degrees, arm resting on examination table.  Implanon identified without problems.  Betadine scrub x3.  1 ml of 1% lidocaine injected under implanon device without problems.  Sterile gloves applied.  Small 0.5cm incision made at distal tip of implanon device with 11 blade scalpel.  Implanon brought to  incision and grasped with a small kelly clamp.  Implanon removed intact without problems.  Nexplanon removed form sterile blister packaging and reinserted through prior removal site,  Device confirmed in needle, before inserting full length of needle, tenting up the skin as the needle was advance.  The drug eluting rod was then deployed by pulling back the slider per the manufactures recommendation.  The implant was palpable by the clinician as well as the patient.  The insertion site covered dressed with a band aid before applying  a kerlex bandage pressure dressing..Minimal blood loss was noted during the procedure.  The patientt tolerated the procedure well.   She  was instructed to wear the bandage for 24 hours, call with any signs of infection.  She was given the Implanon card and instructed to have the rod removed in 3 years.  Assessment: 34 y.o. G1P1001 routine annual exam  Plan: Problem List Items Addressed This Visit    None    Visit Diagnoses    Encounter for gynecological examination without abnormal finding    -  Primary   Screening for malignant neoplasm of cervix       Relevant Orders   Cytology - PAP   Breast screening       Routine screening for STI (sexually transmitted infection)       Relevant Orders   Cytology - PAP      1) STI screening  wasoffered and accepted  2)  ASCCP guidelines and rational discussed.  Patient opts for every 3 years screening interval  3) Contraception - the patient is currently using  Nexplanon.  She is happy with her current form of contraception and plans to continue - nexplanon replaced today 4) Routine healthcare maintenance including cholesterol, diabetes screening discussed managed by PCP  5) .Left ovarian cyst - No specific imaging follow up is recommended according to the Society of Radiologists in Ultrasound 2010 Consensus Conference Statement for simple cysts <3cm incidentally imaged in a reproductive age woman.    Grier Rocher et al.  Management of Asymptomatic Ovarian and Other Adnexal Cysts Imaged at [place]: Society of Radiologists in Ultrasound Consensus Conference Statement 2010. Radiology 256 (Sept 2010): 943-954.).  6) Return in about 1 year (around 10/13/2020) for annual.   Vena Austria, MD, [facility], [facility] 10/14/2019, 9:45 AM

## 2019-10-14 NOTE — Telephone Encounter (Signed)
See result note.  

## 2019-10-16 ENCOUNTER — Telehealth: Payer: Self-pay | Admitting: Obstetrics and Gynecology

## 2019-10-16 ENCOUNTER — Encounter: Payer: Self-pay | Admitting: Obstetrics and Gynecology

## 2019-10-16 NOTE — Telephone Encounter (Signed)
Spoke with pt. She is MyRisk neg from 11/14 (had different last name in [place]). No testing needed. Chart updated.

## 2019-10-16 NOTE — Telephone Encounter (Signed)
Pt had some further questions about the myrisk testing. Can you call her when you get a moment please? Thank you

## 2019-10-17 NOTE — Telephone Encounter (Signed)
Nexplanon provided for this patient. Rcvd/charged 10/14/2019

## 2019-10-18 LAB — CYTOLOGY - PAP
Chlamydia: NEGATIVE
Comment: NEGATIVE
Comment: NEGATIVE
Comment: NORMAL
Diagnosis: NEGATIVE
Diagnosis: REACTIVE
High risk HPV: NEGATIVE
Neisseria Gonorrhea: NEGATIVE

## 2019-10-21 ENCOUNTER — Other Ambulatory Visit: Payer: Self-pay | Admitting: Family Medicine

## 2019-10-21 ENCOUNTER — Encounter: Payer: Managed Care, Other (non HMO) | Admitting: Family

## 2019-10-21 DIAGNOSIS — K649 Unspecified hemorrhoids: Secondary | ICD-10-CM

## 2019-10-21 DIAGNOSIS — K5900 Constipation, unspecified: Secondary | ICD-10-CM

## 2019-10-30 ENCOUNTER — Other Ambulatory Visit: Payer: Self-pay

## 2019-10-30 DIAGNOSIS — R7989 Other specified abnormal findings of blood chemistry: Secondary | ICD-10-CM

## 2019-11-04 ENCOUNTER — Telehealth: Payer: Self-pay

## 2019-11-04 NOTE — Telephone Encounter (Signed)
-----   Message from Richardson Chiquito, CMA sent at 05/16/2019  3:57 PM EDT ----- Pt needs hep a #2 of 2 for armbruster around 11/15/19. See 05/06/19 lab result note for original order

## 2019-11-04 NOTE — Telephone Encounter (Signed)
Pt has an appt on 4-9 at 4:00pm.  Called and LM for pt with the details of her appt and asked her to call back and reschedule if not able to make this appt.

## 2019-11-15 ENCOUNTER — Ambulatory Visit (INDEPENDENT_AMBULATORY_CARE_PROVIDER_SITE_OTHER): Payer: Managed Care, Other (non HMO) | Admitting: Gastroenterology

## 2019-11-15 ENCOUNTER — Other Ambulatory Visit (INDEPENDENT_AMBULATORY_CARE_PROVIDER_SITE_OTHER): Payer: Managed Care, Other (non HMO)

## 2019-11-15 DIAGNOSIS — Z23 Encounter for immunization: Secondary | ICD-10-CM | POA: Diagnosis not present

## 2019-11-15 DIAGNOSIS — R7989 Other specified abnormal findings of blood chemistry: Secondary | ICD-10-CM

## 2019-11-15 LAB — HEPATIC FUNCTION PANEL
ALT: 35 U/L (ref 0–35)
AST: 18 U/L (ref 0–37)
Albumin: 4.8 g/dL (ref 3.5–5.2)
Alkaline Phosphatase: 103 U/L (ref 39–117)
Bilirubin, Direct: 0.2 mg/dL (ref 0.0–0.3)
Total Bilirubin: 1.4 mg/dL — ABNORMAL HIGH (ref 0.2–1.2)
Total Protein: 7.7 g/dL (ref 6.0–8.3)

## 2019-11-19 ENCOUNTER — Other Ambulatory Visit: Payer: Self-pay | Admitting: Gastroenterology

## 2019-11-19 DIAGNOSIS — R1115 Cyclical vomiting syndrome unrelated to migraine: Secondary | ICD-10-CM

## 2019-11-19 MED ORDER — SUMATRIPTAN 20 MG/ACT NA SOLN: 20.0000 mg | Freq: Once | NASAL | 0 refills | Status: DC

## 2019-11-21 ENCOUNTER — Other Ambulatory Visit: Payer: Self-pay | Admitting: Family Medicine

## 2019-11-21 DIAGNOSIS — K5900 Constipation, unspecified: Secondary | ICD-10-CM

## 2019-11-21 DIAGNOSIS — K649 Unspecified hemorrhoids: Secondary | ICD-10-CM

## 2020-01-03 ENCOUNTER — Other Ambulatory Visit: Payer: Self-pay | Admitting: Family Medicine

## 2020-01-03 DIAGNOSIS — K5900 Constipation, unspecified: Secondary | ICD-10-CM

## 2020-01-03 DIAGNOSIS — K649 Unspecified hemorrhoids: Secondary | ICD-10-CM

## 2020-01-15 ENCOUNTER — Ambulatory Visit: Payer: Managed Care, Other (non HMO) | Admitting: Gastroenterology

## 2020-01-15 ENCOUNTER — Encounter: Payer: Self-pay | Admitting: Gastroenterology

## 2020-01-15 VITALS — BP 100/66 | HR 100 | Ht 61.42 in | Wt 152.0 lb

## 2020-01-15 DIAGNOSIS — R109 Unspecified abdominal pain: Secondary | ICD-10-CM | POA: Diagnosis not present

## 2020-01-15 DIAGNOSIS — N83209 Unspecified ovarian cyst, unspecified side: Secondary | ICD-10-CM

## 2020-01-15 DIAGNOSIS — K760 Fatty (change of) liver, not elsewhere classified: Secondary | ICD-10-CM | POA: Diagnosis not present

## 2020-01-15 DIAGNOSIS — R112 Nausea with vomiting, unspecified: Secondary | ICD-10-CM | POA: Diagnosis not present

## 2020-01-15 MED ORDER — APREPITANT 125 MG PO CAPS: ORAL_CAPSULE | ORAL | 0 refills | Status: DC

## 2020-01-15 NOTE — Patient Instructions (Addendum)
Decrease your nortriptyline to 25 mg daily x 1 week then stop.   Stop sumatriptan.   We have sent the following medications to your pharmacy for you to pick up at your convenience: Aprepitant 125 mg to take one tablet by mouth twice daily with an onset of symptoms.   You have been scheduled for a HIDA scan at [facility] (1st floor) on 01/29/20. Please arrive 15 minutes prior to your scheduled appointment at  4:88QB. Make certain not to have anything to eat or drink at least 6 hours prior to your test. Should this appointment date or time not work well for you, please call radiology scheduling at [phone].  _____________________________________________________________________ hepatobiliary (HIDA) scan is an imaging procedure used to diagnose problems in the liver, gallbladder and bile ducts. In the HIDA scan, a radioactive chemical or tracer is injected into a vein in your arm. The tracer is handled by the liver like bile. Bile is a fluid produced and excreted by your liver that helps your digestive system break down fats in the foods you eat. Bile is stored in your gallbladder and the gallbladder releases the bile when you eat a meal. A special nuclear medicine scanner (gamma camera) tracks the flow of the tracer from your liver into your gallbladder and small intestine.  During your HIDA scan  You'll be asked to change into a hospital gown before your HIDA scan begins. Your health care team will position you on a table, usually on your back. The radioactive tracer is then injected into a vein in your arm.The tracer travels through your bloodstream to your liver, where it's taken up by the bile-producing cells. The radioactive tracer travels with the bile from your liver into your gallbladder and through your bile ducts to your small intestine.You may feel some pressure while the radioactive tracer is injected into your vein. As you lie on the table, a special gamma camera is positioned over  your abdomen taking pictures of the tracer as it moves through your body. The gamma camera takes pictures continually for about an hour. You'll need to keep still during the HIDA scan. This can become uncomfortable, but you may find that you can lessen the discomfort by taking deep breaths and thinking about other things. Tell your health care team if you're uncomfortable. The radiologist will watch on a computer the progress of the radioactive tracer through your body. The HIDA scan may be stopped when the radioactive tracer is seen in the gallbladder and enters your small intestine. This typically takes about an hour. In some cases extra imaging will be performed if original images aren't satisfactory, if morphine is given to help visualize the gallbladder or if the medication CCK is given to look at the contraction of the gallbladder. This test typically takes 2 hours to complete. ________________________________________________________________________

## 2020-01-15 NOTE — Progress Notes (Signed)
HPI :  34 year old female here for follow-up visit.  I have seen her for intermittent episodic severe abdominal pain associate with nausea and vomiting.  She is had this ongoing for the past 4 to 5 years, initially she would have an episode every 3 months or so, however its been more frequent perhaps every month over the past 2 years.  Pain is associated with both upper and lower abdomen associated with nausea and vomiting.  Prior work-up has led to ultrasound showing no gallstones, MRI and CT scan of her abdomen, both done when she was not in pain.  Labs have been normal.  EGD has shown mild gastritis negative for H. pylori.  Trial of PPI and antiemetics has not really helped.  She is had an ovarian cyst noted on prior CT scans for which she is seen gynecology and they did not recommend any further evaluation and did not think related to her pain.  She has not been having any menstrual cycles to be related to her pain.  No clear triggers to her pain.  She is completely asymptomatic in between episodes and has no problems eating at baseline.  She denies any routine headaches.  She denies any diarrhea or blood in her stool when this happens.  She recently had a follow-up pelvic ultrasound on March 5 showing stable appearance of the ovarian cyst.  We have been treating her for possible cyclical vomiting syndrome.  She has read about this and agrees that the symptoms are very consistent with it.  Trial of nortriptyline 50 mg nightly for the past few months has not provided any significant benefit.  I then gave her a trial of sumatriptan to try to abort an episode and that did not provide any benefit.  Antiemetics does not appear to help, Zofran does not help in the past, I gave her a trial of Phenergan for which she has taken once and did not help.  She does have a history of fatty liver and have been monitoring her liver enzymes.  Prior workup: [place] 03/11/19 - fatty liver, complex cystic stricture lower pole  left kidney, no gallstones  MRI 03/15/19 - Boskniak type 2 F cyst, repeat MRI in 6 months, possible pancreas divisum, fatty liver  EGD 05/09/19 - - The exam of the esophagus was otherwise normal. - Patchy mildly erythematous mucosa was found in the gastric fundus without ulceration. - The exam of the stomach was otherwise normal. - Biopsies were taken with a cold forceps in the gastric fundus, in the gastric body, at the incisura and in the gastric antrum for Helicobacter pylori testing. - The duodenal bulb and second portion of the duodenum were normal.  CT scan 08/28/19 at [facility] Findings: Suspected hepatic steatosis 2.1cm renal cyst which she has had noted previously 4.7cm ovarian cyst on the left ovary - radiology recommends consideration for pelvic US in 6 weeks to assess for resolution  Pelvic US 10/11/19 IMPRESSION: Debris within urinary bladder of uncertain etiology, recommend correlation with urinalysis.  Simple cyst LEFT ovary 3.3 cm greatest size.  Remainder of exam unremarkable.  No evidence of ovarian torsion    Past Medical History:  Diagnosis Date  . Anxiety   . Arthritis    FINGER, ELBOW  . BRCA negative 06/2013   MyRisk neg  . Chicken pox   . Depression   . Family history of breast cancer   . Family history of pancreatic cancer   . GERD (gastroesophageal reflux disease)  OCC- NO MEDS  . Migraine    H/O MIGRAINES     Past Surgical History:  Procedure Laterality Date  . HEMORRHOID SURGERY N/A 04/05/2019   Procedure: HEMORRHOIDECTOMY, EXCISION ANAL SKIN TAG;  Surgeon: Fredirick Maudlin, MD;  Location: [facility] ORS;  Service: General;  Laterality: N/A;  . WISDOM TOOTH EXTRACTION  2013?   Family History  Problem Relation Age of Onset  . Hyperlipidemia Mother   . Mental illness Mother        anxiety and depression  . Uterine cancer Mother   . Alcohol abuse Father   . Hypertension Maternal Grandmother   . Heart disease Maternal Grandmother     . Stroke Maternal Grandmother   . Mental illness Maternal Grandmother   . Pancreatic cancer Maternal Grandmother 77       not genetic  . Alcohol abuse Paternal Grandfather   . Cancer Paternal Grandfather   . Breast cancer Paternal Grandmother   . Breast cancer Paternal Great-grandmother   . Breast cancer Paternal Aunt   . Stomach cancer Neg Hx   . Rectal cancer Neg Hx   . Colon cancer Neg Hx   . Esophageal cancer Neg Hx    Social History   Tobacco Use  . Smoking status: Never Smoker  . Smokeless tobacco: Never Used  Substance Use Topics  . Alcohol use: Yes    Alcohol/week: 1.0 standard drinks    Types: 1 Standard drinks or equivalent per week    Comment: 1 glass of wine every few weeks.   . Drug use: No   Current Outpatient Medications  Medication Sig Dispense Refill  . ALPRAZolam (XANAX) 0.5 MG tablet Take 1 tablet (0.5 mg total) by mouth at bedtime as needed for anxiety. 30 tablet 1  . cyclobenzaprine (FLEXERIL) 5 MG tablet Take 5 mg by mouth 3 (three) times daily as needed for muscle spasms.    Marland Kitchen docusate sodium (COLACE) 100 MG capsule TAKE 1 CAPSULE(100 MG) BY MOUTH TWICE DAILY 60 capsule 0  . nortriptyline (PAMELOR) 25 MG capsule Take 2 capsules (50 mg total) by mouth at bedtime. 90 capsule 1  . pantoprazole (PROTONIX) 40 MG tablet Take 1 tablet (40 mg total) by mouth daily. 90 tablet 1  . AMBULATORY NON FORMULARY MEDICATION Medication Name: Nitroglycerine ointment 0.125 %  Apply a pea sized amount internally three times daily for 4 to 6 weeks Dispense 30 GM zero refill (Patient not taking: Reported on 01/15/2020) 30 g 1  . aprepitant (EMEND) 125 MG capsule Take one capsule by mouth twice daily with onset of symptoms 5 capsule 0  . ondansetron (ZOFRAN-ODT) 4 MG disintegrating tablet Take 4 mg by mouth every 8 (eight) hours as needed for nausea or vomiting.    . promethazine (PHENERGAN) 25 MG tablet Take one tablet by mouth at onset of nausea (Patient not taking: Reported on  01/15/2020) 10 tablet 0   Current Facility-Administered Medications  Medication Dose Route Frequency Provider Last Rate Last Admin  . etonogestrel (NEXPLANON) implant 68 mg  68 mg Subdermal Once Dalia Heading, CNM       Allergies  Allergen Reactions  . Betadine [Povidone Iodine] Rash  . Shellfish Allergy Swelling and Rash     Review of Systems: All systems reviewed and negative except where noted in HPI.   Lab Results  Component Value Date   WBC 11.2 (H) 10/03/2019   HGB 15.3 (H) 10/03/2019   HCT 44.5 10/03/2019   MCV 86.4 10/03/2019   PLT  214.0 10/03/2019    Lab Results  Component Value Date   CREATININE 0.81 08/16/2019   BUN 19 08/16/2019   NA 141 08/16/2019   K 3.5 08/16/2019   CL 102 08/16/2019   CO2 28 08/16/2019    Lab Results  Component Value Date   ALT 35 11/15/2019   AST 18 11/15/2019   ALKPHOS 103 11/15/2019   BILITOT 1.4 (H) 11/15/2019     Physical Exam: BP 100/66 (BP Location: Left Arm, Patient Position: Sitting, Cuff Size: Normal)   Pulse 100   Ht 5' 1.42" (1.56 m) Comment: height measured without shoes  Wt 152 lb (68.9 kg)   BMI 28.33 kg/m  Constitutional: Pleasant,well-developed, female in no acute distress. HEENT: Normocephalic and atraumatic. Conjunctivae are normal. No scleral icterus. Neck supple.  Abdominal: Soft, nondistended, nontender. There are no masses palpable. No hepatomegaly. Extremities: no edema Lymphadenopathy: No cervical adenopathy noted. Neurological: Alert and oriented to person place and time. Skin: Skin is warm and dry. No rashes noted. Psychiatric: Normal mood and affect. Behavior is normal.   ASSESSMENT AND PLAN: 34 year old female here for reassessment following:  Episodic abdominal pain associate with nausea and vomiting - history as outlined above.  Work-up has not found a clear etiology.  I have suspected she may have cyclical vomiting syndrome and she agrees that that diagnosis is possible.  Unfortunately  treatment with TCA and sumatriptan has not provided any benefit so far.  She has not responded to both antiemetics during an episode either.  I discussed options with her.  At baseline she does not have any biliary colic however gallbladder could certainly cause her pain and vomiting during an episode.  I will refer her for a HIDA scan to ensure her gallbladder is functioning okay.  She is only tried Phenergan once, recommend she try this again as needed.  We otherwise discussed second line therapies for cyclical vomiting syndrome and will give her a trial of aprepitant 125 mg twice a day as needed, if covered by insurance.  If she has a severe episode of symptoms, it would be useful to have labs and CT imaging done during an episode to see if we can clarify the source of her symptoms, she may need to go to the ED if she has a severe episode to do this.  Otherwise will wean off the nortriptyline, reduce to 25 mg a day for 1 to 2 weeks, and then stop it.  She will also stop the sumatriptan.  I will see her back in a few months for reassessment  Ovarian cyst - seen by gynecology per patient report, no intervention planned at this time  Fatty liver - noted incidentally on imaging.  Previously had some elevated ALT although this is more recently normal.  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  Lindsay Cellar, MD [facility]

## 2020-01-29 ENCOUNTER — Ambulatory Visit (HOSPITAL_COMMUNITY): Payer: Managed Care, Other (non HMO)

## 2020-02-01 ENCOUNTER — Other Ambulatory Visit: Payer: Self-pay | Admitting: Family Medicine

## 2020-02-01 DIAGNOSIS — K649 Unspecified hemorrhoids: Secondary | ICD-10-CM

## 2020-02-01 DIAGNOSIS — K5900 Constipation, unspecified: Secondary | ICD-10-CM

## 2020-02-03 NOTE — Telephone Encounter (Signed)
Okay to fill? 

## 2020-02-04 ENCOUNTER — Other Ambulatory Visit: Payer: Self-pay

## 2020-02-04 ENCOUNTER — Encounter (HOSPITAL_COMMUNITY)
Admission: RE | Admit: 2020-02-04 | Discharge: 2020-02-04 | Disposition: A | Discharge status: Home / Self Care | Payer: Managed Care, Other (non HMO) | Source: Ambulatory Visit | Attending: Gastroenterology | Admitting: Gastroenterology

## 2020-02-04 DIAGNOSIS — R109 Unspecified abdominal pain: Secondary | ICD-10-CM

## 2020-02-04 MED ORDER — TECHNETIUM TC 99M MEBROFENIN IV KIT
4.7000 | PACK | Freq: Once | INTRAVENOUS | Status: AC | PRN
  Administered 2020-02-04: 4.7 via INTRAVENOUS

## 2020-04-06 ENCOUNTER — Encounter (HOSPITAL_COMMUNITY): Payer: Self-pay

## 2020-04-06 ENCOUNTER — Emergency Department (HOSPITAL_COMMUNITY)
Admission: EM | Admit: 2020-04-06 | Discharge: 2020-04-07 | Disposition: A | Discharge status: Home / Self Care | Payer: Managed Care, Other (non HMO) | Attending: Emergency Medicine | Admitting: Emergency Medicine

## 2020-04-06 ENCOUNTER — Other Ambulatory Visit: Payer: Self-pay

## 2020-04-06 DIAGNOSIS — R1110 Vomiting, unspecified: Secondary | ICD-10-CM | POA: Insufficient documentation

## 2020-04-06 DIAGNOSIS — Z5321 Procedure and treatment not carried out due to patient leaving prior to being seen by health care provider: Secondary | ICD-10-CM | POA: Insufficient documentation

## 2020-04-06 DIAGNOSIS — R1030 Lower abdominal pain, unspecified: Secondary | ICD-10-CM | POA: Diagnosis present

## 2020-04-06 LAB — COMPREHENSIVE METABOLIC PANEL
ALT: 33 U/L (ref 0–44)
AST: 19 U/L (ref 15–41)
Albumin: 5.1 g/dL — ABNORMAL HIGH (ref 3.5–5.0)
Alkaline Phosphatase: 87 U/L (ref 38–126)
Anion gap: 14 (ref 5–15)
BUN: 14 mg/dL (ref 6–20)
CO2: 23 mmol/L (ref 22–32)
Calcium: 10 mg/dL (ref 8.9–10.3)
Chloride: 104 mmol/L (ref 98–111)
Creatinine, Ser: 0.9 mg/dL (ref 0.44–1.00)
GFR calc Af Amer: >60 mL/min (ref >60)
GFR calc non Af Amer: >60 mL/min (ref >60)
Glucose, Bld: 125 mg/dL — ABNORMAL HIGH (ref 70–99)
Potassium: 4 mmol/L (ref 3.5–5.1)
Sodium: 141 mmol/L (ref 135–145)
Total Bilirubin: 2.6 mg/dL — ABNORMAL HIGH (ref 0.3–1.2)
Total Protein: 8.7 g/dL — ABNORMAL HIGH (ref 6.5–8.1)

## 2020-04-06 LAB — CBC
HCT: 46 % (ref 36.0–46.0)
Hemoglobin: 16.1 g/dL — ABNORMAL HIGH (ref 12.0–15.0)
MCH: 30 pg (ref 26.0–34.0)
MCHC: 35 g/dL (ref 30.0–36.0)
MCV: 85.7 fL (ref 80.0–100.0)
Platelets: 264 10*3/uL (ref 150–400)
RBC: 5.37 MIL/uL — ABNORMAL HIGH (ref 3.87–5.11)
RDW: 12 % (ref 11.5–15.5)
WBC: 15.4 10*3/uL — ABNORMAL HIGH (ref 4.0–10.5)
nRBC: 0 % (ref 0.0–0.2)

## 2020-04-06 LAB — I-STAT BETA HCG BLOOD, ED (MC, WL, AP ONLY): I-stat hCG, quantitative: <5 m[IU]/mL (ref <5)

## 2020-04-06 LAB — LIPASE, BLOOD: Lipase: 31 U/L (ref 11–51)

## 2020-04-06 NOTE — ED Triage Notes (Signed)
Patient arrived via [facility] with complaints of lower abdominal pain over the last three hours, reports history of same for 4 years. Reports some vomiting but has antiemetics given prior to arrival.

## 2020-04-07 ENCOUNTER — Telehealth: Payer: Self-pay | Admitting: Gastroenterology

## 2020-04-07 ENCOUNTER — Other Ambulatory Visit
Admission: RE | Admit: 2020-04-07 | Discharge: 2020-04-07 | Disposition: A | Discharge status: Home / Self Care | Payer: Managed Care, Other (non HMO) | Attending: *Deleted | Admitting: *Deleted

## 2020-04-07 ENCOUNTER — Telehealth (INDEPENDENT_AMBULATORY_CARE_PROVIDER_SITE_OTHER): Payer: Managed Care, Other (non HMO) | Admitting: Nurse Practitioner

## 2020-04-07 ENCOUNTER — Encounter: Payer: Self-pay | Admitting: Nurse Practitioner

## 2020-04-07 VITALS — Temp 98.0°F | Ht 61.0 in | Wt 145.0 lb

## 2020-04-07 DIAGNOSIS — R112 Nausea with vomiting, unspecified: Secondary | ICD-10-CM | POA: Diagnosis not present

## 2020-04-07 DIAGNOSIS — R300 Dysuria: Secondary | ICD-10-CM | POA: Diagnosis not present

## 2020-04-07 DIAGNOSIS — R29898 Other symptoms and signs involving the musculoskeletal system: Secondary | ICD-10-CM

## 2020-04-07 DIAGNOSIS — D72829 Elevated white blood cell count, unspecified: Secondary | ICD-10-CM

## 2020-04-07 DIAGNOSIS — R1084 Generalized abdominal pain: Secondary | ICD-10-CM | POA: Diagnosis not present

## 2020-04-07 DIAGNOSIS — R17 Unspecified jaundice: Secondary | ICD-10-CM

## 2020-04-07 DIAGNOSIS — N281 Cyst of kidney, acquired: Secondary | ICD-10-CM

## 2020-04-07 LAB — URINALYSIS, ROUTINE W REFLEX MICROSCOPIC
Bacteria, UA: NONE SEEN
Bilirubin Urine: NEGATIVE
Glucose, UA: NEGATIVE mg/dL
Hgb urine dipstick: NEGATIVE
Ketones, ur: NEGATIVE mg/dL
Nitrite: NEGATIVE
Protein, ur: NEGATIVE mg/dL
Specific Gravity, Urine: 1.019 (ref 1.005–1.030)
pH: 5 (ref 5.0–8.0)

## 2020-04-07 NOTE — Progress Notes (Signed)
Virtual Visit via Video Note  This visit type was conducted due to national recommendations for restrictions regarding the COVID-19 pandemic (e.g. social distancing).  This format is felt to be most appropriate for this patient at this time.  All issues noted in this document were discussed and addressed.  No physical exam was performed (except for noted visual exam findings with Video Visits).   I connected with@ on 04/08/20 at  4:00 PM EDT by a video enabled telemedicine application or telephone and verified that I am speaking with the correct person using two identifiers. Location patient: home Location provider: work  Persons participating in the virtual visit: patient, provider  I discussed the limitations, risks, security and privacy concerns of performing an evaluation and management service by telephone and the availability of in person appointments. I also discussed with the patient that there may be a patient responsible charge related to this service. The patient expressed understanding and agreed to proceed.   Reason for visit: Patient reports her legs feel weak when she walks, and her leg shakes when she puts on the brake in the car.  HPI: 34 year old reports that she has not been exercising her legs feel weaker than normal.  This is bilateral.  Her legs do not feel as strong as they used to and when she squats. She notices when she puts the brake on with her right leg, and holds it for a short period time, the leg starts to quiver. She used to exercise, but has become very sedentary.She thought it may be her sciatica. Occasional her back catches if she moves a certain way. Every once in a while her hip hurts and she uses a heating pad. She has had the  top of thighs and gets numb and is random. The last time was 3 weeks ago. Legs do not give out. No back or leg pain now.  Hx of UTI:  She is also reporting urinary frequency and think she may be starting a urinary tract infection.  She  is not voiding as much as usual.  She has not been drinking as much.  No burning or pain with voiding, blood in urine or fevers or chills.  Cyclical abd pain and vomiting every 3 mos.She was advised to go to the ER for CT imaging when she was in the middle of one of her spells.  She did go to the emergency department 04/06/2020 for severe abdominal pain and bad nausea. She has been given a new antiemetic -aprepitant 125 mg-and she took that and did not vomit. This is the first anti-emetic that worked for her. She was seen in triage and had labs, but  left the ER  before she was seen after waiting 10 hours. She was pain free by then.Labs done in the ED were abnormal with elevated total bili and WBC.   The patient has been dealing with cyclical vomiting and abdominal pain  for several years and is followed by her gastroenterologist, Dr. Havery Moros. She gets these spells in the evening always after 5:30 PM, that begins as a weird feeling in her upper abdomen bilat pain that radiates down into the lower abdomen and is described as a continuous squeezing type pain. It Is not cramp-like. It becomes severe generalized abdominal  pain to the point her skin is sensitive and she cannot stand her waistband to touch her stomach.  No known triggers. Nothing relieves it. No diarrhea. The pain is often followed by vomiting. Once she begins  to vomit, she vomits for  8 hours.  The Sx then resolve and are gone after 8 hours or so. She feels fine in between episodes.   She has had CT, MRI, [place], HIDA studies and she reports they have not found anything. With further questioning, she is aware of fatty liver, pancreatic divisum, and a renal cyst that needs further imaging. She has not paid off the 03/2019 MRI- and has requested to wait a few more months before repeating the MRI scan.    She is currently asymptomatic and has normal BM.   Leaving for the beach tomorrow for vacation. She does not drink alcohol.   Leukocytosis: 03/04/2019 WBC 7.8 WBC- 16.7 08/16/2019: WBC 16.7,neutrophils 10.5, lymph 4.9, monos 1.2 10/03/2019: WBC 11.2, Hgb 15.3, differential normal 04/06/2020: WBC 15.4, Hgb 16.1 04/06/2020: total bili 2.6, up from 1.4 , albumin 5.1, total protein 8.7, lipase 31  8/3/202: [place] for generalized abdominal pain: IMPRESSION: No acute process within the abdomen. Hepatic steatosis.Complex cystic structure lower pole left kidney, incompletely characterized on current exam. Consider dedicated evaluation with pre and post contrast-enhanced CT or MRI.  8//2020: MRI abd W WO contrast for left renal lesion on [place]: IMPRESSION: 1. Interpolar left renal lesion is consistent with a Bosniak type 59F cyst. Recommend surveillance with pre and post contrast abdominal MRI. Consider initial follow-up at 6 months. 2.  No acute abdominal process. 3. Hepatic steatosis. 4. Possible pancreas divisum or variant ROS: See pertinent positives and negatives per HPI.  05/09/2019 EGD performed for nausea vomiting: Normal esophagus, erythematous mucosa in the gastric fundus, normal duodenum bulb and second portion of the duodenum.  H. Pylori-neg. No findings to explain her symptoms.  08/28/2019: CT abd/pelvis: W-F-B outside scan: Hepatic steatosis. Normal gallbladder.Ovarian cyst.  10/11/2019: US pelvis for f/up ovarian cyst on CT: IMPRESSION: Debris within urinary bladder of uncertain etiology, recommend correlation with urinalysis. Simple cyst LEFT ovary 3.3 cm greatest size. Remainder of exam unremarkable. No evidence of ovarian torsion.  02/04/20: NM HEPATO W/EF for abd pain: Normal study  Past Medical History:  Diagnosis Date  . Anxiety   . Arthritis    FINGER, ELBOW  . BRCA negative 06/2013   MyRisk neg  . Chicken pox   . Depression   . Family history of breast cancer   . Family history of pancreatic cancer   . GERD (gastroesophageal reflux disease)    OCC- NO MEDS  . Migraine    H/O MIGRAINES    Past  Surgical History:  Procedure Laterality Date  . HEMORRHOID SURGERY N/A 04/05/2019   Procedure: HEMORRHOIDECTOMY, EXCISION ANAL SKIN TAG;  Surgeon: Fredirick Maudlin, MD;  Location: [facility] ORS;  Service: General;  Laterality: N/A;  . WISDOM TOOTH EXTRACTION  2013?    Family History  Problem Relation Age of Onset  . Hyperlipidemia Mother   . Mental illness Mother        anxiety and depression  . Uterine cancer Mother   . Alcohol abuse Father   . Hypertension Maternal Grandmother   . Heart disease Maternal Grandmother   . Stroke Maternal Grandmother   . Mental illness Maternal Grandmother   . Pancreatic cancer Maternal Grandmother 77       not genetic  . Alcohol abuse Paternal Grandfather   . Cancer Paternal Grandfather   . Breast cancer Paternal Grandmother   . Breast cancer Paternal Great-grandmother   . Breast cancer Paternal Aunt   . Stomach cancer Neg Hx   . Rectal cancer  Neg Hx   . Colon cancer Neg Hx   . Esophageal cancer Neg Hx     SOCIAL HX: Never smoked. Rare alcohol. Never an alcohol drinker.    Current Outpatient Medications:  .  ALPRAZolam (XANAX) 0.5 MG tablet, Take 1 tablet (0.5 mg total) by mouth at bedtime as needed for anxiety., Disp: 30 tablet, Rfl: 1 .  AMBULATORY NON FORMULARY MEDICATION, Medication Name: Nitroglycerine ointment 0.125 %  Apply a pea sized amount internally three times daily for 4 to 6 weeks Dispense 30 GM zero refill, Disp: 30 g, Rfl: 1 .  aprepitant (EMEND) 125 MG capsule, Take one capsule by mouth twice daily with onset of symptoms, Disp: 5 capsule, Rfl: 0 .  cyclobenzaprine (FLEXERIL) 5 MG tablet, Take 5 mg by mouth 3 (three) times daily as needed for muscle spasms., Disp: , Rfl:  .  ondansetron (ZOFRAN-ODT) 4 MG disintegrating tablet, Take 4 mg by mouth every 8 (eight) hours as needed for nausea or vomiting., Disp: , Rfl:  .  promethazine (PHENERGAN) 25 MG tablet, Take one tablet by mouth at onset of nausea, Disp: 10 tablet, Rfl: 0 .  STOOL  SOFTENER 100 MG capsule, TAKE 1 CAPSULE(100 MG) BY MOUTH TWICE DAILY, Disp: 60 capsule, Rfl: 0  Current Facility-Administered Medications:  .  etonogestrel (NEXPLANON) implant 68 mg, 68 mg, Subdermal, Once, Danise Mina, [place], CNM  EXAM:  VITALS per patient if applicable:  GENERAL: alert, oriented, appears well and in no acute distress  HEENT: atraumatic, conjunctiva clear, no obvious abnormalities on inspection of external nose and ears  NECK: normal movements of the head and neck  LUNGS: on inspection no signs of respiratory distress, breathing rate appears normal, no obvious gross SOB, gasping or wheezing  Abdomen: She can press on her abdomen with no pain.  CV: no obvious cyanosis  MS: moves all visible extremities without noticeable abnormality  PSYCH/NEURO: pleasant and cooperative, no obvious depression or anxiety, speech and thought processing grossly intact  ASSESSMENT AND PLAN:  Discussed the following assessment and plan:  Dysuria - Plan: Urinalysis, Routine w reflex microscopic, Urine Culture  Leukocytosis Referral to HEMATOLOGY  Renal cyst, left Repeat MRI and referral to Urology to follow  Nausea and vomiting Referral to [facility] for  second opinion   Generalized abdominal pain Referral to [facility] for  second opinion   Weakness of both legs Exercise and monitor for back pain/sciatica  Tawanna made this appointment to discuss what she thought may be sciatica contributing to some leg weakness.  She will get back discomfort from time to time.  No active back complaints, legs feel strong now, and she will monitor.  She has not been doing any type of exercise and will begin walking again.  I did review her chart as she had been to the emergency room for what she calls  her cyclical abdominal pain and vomiting spells.  I reviewed her imaging studies, EGD, and labs.  She has a rise in her bilirubin with the symptoms.  She has hepatic steatosis, pancreatic divisum,  and a  normal gallbladder and a HIDA study, gastritis. She would like a second opinion by another gastroenterologist.   She does have a persistent leukocytosis, and this has not been fully evaluated.  I would recommend hematology opinion.    She reports urinary frequency and will check UA with culture.  She does have a Bosniak type II F cyst with radiology recommendation to repeat the MRI in 6 months.  It has now been 1 year.  Patient still wants to hold off due to cost.  We discussed this today.  She has met her deductible.  She is willing to proceed with this MRI.  I will place the order.  Pending the findings, I will also refer her back to Urology for continued surveillance and management.   I discussed the assessment and treatment plan with the patient. The patient was provided an opportunity to ask questions and all were answered. The patient agreed with the plan and demonstrated an understanding of the instructions.   The patient was advised to call back or seek an in-person evaluation if the symptoms worsen or if the condition fails to improve as anticipated.  I provided 35 minutes of non-face-to-face time during this encounter. Denice Paradise, NP Adult Nurse Practitioner [place] [phone]

## 2020-04-07 NOTE — Patient Instructions (Addendum)
Medical mall lab for urine check   Office visit with me in one patient to go to the medical mall to [facility] for laboratory studies and a urine check.  Month.   Referral to [facility]   Walk for leg strength.

## 2020-04-07 NOTE — Telephone Encounter (Signed)
Patient was in the ED last night from 8:00 till 530 this am.  She tried the [facility] and it seems to have worked.  Her symptoms resolved before she was seen and finally left once her symptoms completely resolved.  She did have some labs. She says the Encinal helped with the nausea and vomiting, but not with the pain.  She feels well today except for some abdominal tenderness. She went by EMS and still sat in waiting room over 10 hours.  She was not assessed by ER MD and did not get a CT

## 2020-04-07 NOTE — Telephone Encounter (Signed)
Okay thanks [employer]. I see her WBC was elevated as it has been in the past when she is in pain. I do think a CT at the time of symptoms would be useful, unfortunately due to COVID that was not possible. She should use the EMEND if this happens again and contact us to see if we can coordinate a CT in time. Thanks

## 2020-04-08 DIAGNOSIS — N281 Cyst of kidney, acquired: Secondary | ICD-10-CM | POA: Insufficient documentation

## 2020-04-08 DIAGNOSIS — R29898 Other symptoms and signs involving the musculoskeletal system: Secondary | ICD-10-CM | POA: Insufficient documentation

## 2020-04-08 DIAGNOSIS — R1084 Generalized abdominal pain: Secondary | ICD-10-CM | POA: Insufficient documentation

## 2020-04-08 DIAGNOSIS — D72829 Elevated white blood cell count, unspecified: Secondary | ICD-10-CM | POA: Insufficient documentation

## 2020-04-08 DIAGNOSIS — R112 Nausea with vomiting, unspecified: Secondary | ICD-10-CM | POA: Insufficient documentation

## 2020-04-08 DIAGNOSIS — R300 Dysuria: Secondary | ICD-10-CM | POA: Insufficient documentation

## 2020-04-08 LAB — URINE CULTURE: Culture: NO GROWTH

## 2020-04-08 NOTE — Assessment & Plan Note (Signed)
Repeat MRI and referral to Urology to follow

## 2020-04-08 NOTE — Assessment & Plan Note (Signed)
Referral to HEMATOLOGY

## 2020-04-08 NOTE — Assessment & Plan Note (Signed)
Referral to [facility] for  second opinion  

## 2020-04-08 NOTE — Telephone Encounter (Signed)
Spoke with patient this morning and advised of your recommendations. Pt is aware to use EMEND if this happens again and to contact our office to see if we can get her scheduled for a CT scan in time. Pt verbalized understanding,  She also states that she saw her PCP yesterday and they noticed that her WBC count had been elevated for the last 7 months as well, they have referred her to hematology and just wanted to make you aware.

## 2020-04-08 NOTE — Assessment & Plan Note (Signed)
Exercise and monitor for back pain/sciatica

## 2020-04-08 NOTE — Assessment & Plan Note (Signed)
Referral to [facility] for  second opinion

## 2020-04-08 NOTE — Telephone Encounter (Signed)
Okay thanks [place] 

## 2020-04-09 ENCOUNTER — Telehealth: Payer: Self-pay | Admitting: Nurse Practitioner

## 2020-04-09 NOTE — Telephone Encounter (Signed)
lft vm for pt to call ofc regarding MRI needing to know if pt is claustrophobic.

## 2020-04-09 NOTE — Telephone Encounter (Signed)
Receivd anew hem referral from Amedeo Kinsman for leukocytosis. Rebecca Davies has been cld and scheduled to see [name] on 9/23 at 145pm. Pt aware to arrive 15 minutes early.

## 2020-04-30 ENCOUNTER — Inpatient Hospital Stay: Payer: Managed Care, Other (non HMO)

## 2020-04-30 ENCOUNTER — Other Ambulatory Visit: Payer: Self-pay

## 2020-04-30 ENCOUNTER — Inpatient Hospital Stay: Payer: Managed Care, Other (non HMO) | Attending: Nurse Practitioner | Admitting: Nurse Practitioner

## 2020-04-30 VITALS — BP 118/90 | HR 95 | Temp 98.1°F | Resp 18 | Ht 61.0 in | Wt 140.6 lb

## 2020-04-30 DIAGNOSIS — R112 Nausea with vomiting, unspecified: Secondary | ICD-10-CM | POA: Diagnosis not present

## 2020-04-30 DIAGNOSIS — F419 Anxiety disorder, unspecified: Secondary | ICD-10-CM | POA: Insufficient documentation

## 2020-04-30 DIAGNOSIS — R109 Unspecified abdominal pain: Secondary | ICD-10-CM | POA: Diagnosis not present

## 2020-04-30 DIAGNOSIS — D72829 Elevated white blood cell count, unspecified: Secondary | ICD-10-CM

## 2020-04-30 DIAGNOSIS — F329 Major depressive disorder, single episode, unspecified: Secondary | ICD-10-CM | POA: Diagnosis not present

## 2020-04-30 LAB — CBC WITH DIFFERENTIAL (CANCER CENTER ONLY)
Abs Immature Granulocytes: 0.02 10*3/uL (ref 0.00–0.07)
Basophils Absolute: 0 10*3/uL (ref 0.0–0.1)
Basophils Relative: 0 %
Eosinophils Absolute: 0.1 10*3/uL (ref 0.0–0.5)
Eosinophils Relative: 1 %
HCT: 42.7 % (ref 36.0–46.0)
Hemoglobin: 14.8 g/dL (ref 12.0–15.0)
Immature Granulocytes: 0 %
Lymphocytes Relative: 35 %
Lymphs Abs: 3.5 10*3/uL (ref 0.7–4.0)
MCH: 29.5 pg (ref 26.0–34.0)
MCHC: 34.7 g/dL (ref 30.0–36.0)
MCV: 85.1 fL (ref 80.0–100.0)
Monocytes Absolute: 0.6 10*3/uL (ref 0.1–1.0)
Monocytes Relative: 6 %
Neutro Abs: 5.9 10*3/uL (ref 1.7–7.7)
Neutrophils Relative %: 58 %
Platelet Count: 233 10*3/uL (ref 150–400)
RBC: 5.02 MIL/uL (ref 3.87–5.11)
RDW: 12 % (ref 11.5–15.5)
WBC Count: 10.2 10*3/uL (ref 4.0–10.5)
nRBC: 0 % (ref 0.0–0.2)

## 2020-04-30 LAB — TECHNOLOGIST SMEAR REVIEW

## 2020-04-30 LAB — SAVE SMEAR(SSMR), FOR PROVIDER SLIDE REVIEW

## 2020-04-30 NOTE — Progress Notes (Addendum)
Center  [phone] Fax:([phone]) [place] consult Note   Patient Care Team: Marval Regal, NP as PCP - General (Nurse Practitioner) Havery Moros, Carlota Raspberry, MD as Consulting Physician (Gastroenterology) Truitt Merle, MD as Consulting Physician (Hematology) Date of Service: 04/30/2020   CHIEF COMPLAINTS/PURPOSE OF CONSULTATION:  Leukocytosis, referred by Denice Paradise, PCP  HISTORY OF PRESENTING ILLNESS:  Rebecca Davies 34 y.o. female with history of anxiety and depression is here because of elevated white blood cell count. She was found to have abnormal CBC from 08/16/2019 with a WBC 16.7, ANC 10.5, and mildly elevated lymphocytes and monocytes.  She also had slightly elevated RBC at 5.3 and hemoglobin 15.5.  Her CBC was previously normal and 2016 and 2020.  She is under GI work-up for recurring nausea/vomiting and abdominal pain that lasts about 8 hours every 3-5 months that previously began about 4 years ago but progressed in the last 2 years.  Her CBCs recently have been either during or immediately after an episode.  She denies allergies or recurrent respiratory infections.  No recent steroids.  She is a non-smoker, denies other illicit drug use.  Drinks alcohol rarely.  She has not had splenectomy.  She has contraceptive implant, does not have menstrual bleeding.   Socially she is single, 1 son age 69. 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 She is independent with ADLs. She smoked cigarettes remotely many years ago, nothing consistent. Drinks alcohol rarely. Denies illicit drug use. She is up to date on PAP screening. She has multiple family history cancers, including mother with uterine, grandparent with pancreatic, and many with breast cancer see history section. She reportedly had BRCA testing and is negative.    Rebecca Davies presents by herself today. She feels well in general. She has lost 12 lbs unintentionally due to GI spells that include  transient n/v and abdominal pain, lasting 8 hours q3-5 months. Last spell was 03/2020 when most recent labs were obtained. Otherwise denies changes in her appetite, energy, bowel habits, bleeding, fever, chills, night sweats, adenopathy, cough, chest pain, dyspnea, recent infection, allergies, bleeding. ROS otherwise negative.       MEDICAL HISTORY:  Past Medical History:  Diagnosis Date  . Anxiety   . Arthritis    FINGER, ELBOW  . BRCA negative 06/2013   MyRisk neg  . Chicken pox   . Depression   . Family history of breast cancer   . Family history of pancreatic cancer   . GERD (gastroesophageal reflux disease)    OCC- NO MEDS  . Migraine    H/O MIGRAINES    SURGICAL HISTORY: Past Surgical History:  Procedure Laterality Date  . HEMORRHOID SURGERY N/A 04/05/2019   Procedure: HEMORRHOIDECTOMY, EXCISION ANAL SKIN TAG;  Surgeon: Fredirick Maudlin, MD;  Location: [facility] ORS;  Service: General;  Laterality: N/A;  . WISDOM TOOTH EXTRACTION  2013?    SOCIAL HISTORY: Social History   Socioeconomic History  . Marital status: Single    Spouse name: Not on file  . Number of children: Not on file  . Years of education: Not on file  . Highest education level: Not on file  Occupational History  . Not on file  Tobacco Use  . Smoking status: Never Smoker  . Smokeless tobacco: Never Used  Vaping Use  . Vaping Use: Never used  Substance and Sexual Activity  . Alcohol use: Yes    Alcohol/week: 1.0 standard drink    Types: 1  Standard drinks or equivalent per week    Comment: 1 glass of wine every few weeks.   . Drug use: No  . Sexual activity: Yes    Partners: Male    Birth control/protection: Implant  Other Topics Concern  . Not on file  Social History Narrative   From [place] and residing in [place]   1 Son    1 cat in the house   Enjoys movies, going to eat, beauty products    Social Determinants of Health   Financial Resource Strain:   . Difficulty of Paying Living  Expenses: Not on file  Food Insecurity:   . Worried About Charity fundraiser in the Last Year: Not on file  . Ran Out of Food in the Last Year: Not on file  Transportation Needs:   . Lack of Transportation (Medical): Not on file  . Lack of Transportation (Non-Medical): Not on file  Physical Activity:   . Days of Exercise per Week: Not on file  . Minutes of Exercise per Session: Not on file  Stress:   . Feeling of Stress : Not on file  Social Connections:   . Frequency of Communication with Friends and Family: Not on file  . Frequency of Social Gatherings with Friends and Family: Not on file  . Attends Religious Services: Not on file  . Active Member of Clubs or Organizations: Not on file  . Attends Archivist Meetings: Not on file  . Marital Status: Not on file  Intimate Partner Violence:   . Fear of Current or Ex-Partner: Not on file  . Emotionally Abused: Not on file  . Physically Abused: Not on file  . Sexually Abused: Not on file    FAMILY HISTORY: Family History  Problem Relation Age of Onset  . Hyperlipidemia Mother   . Mental illness Mother        anxiety and depression  . Uterine cancer Mother   . Alcohol abuse Father   . Hypertension Maternal Grandmother   . Heart disease Maternal Grandmother   . Stroke Maternal Grandmother   . Mental illness Maternal Grandmother   . Pancreatic cancer Maternal Grandmother 77       not genetic  . Alcohol abuse Paternal Grandfather   . Cancer Paternal Grandfather   . Breast cancer Paternal Grandmother   . Breast cancer Paternal Great-grandmother   . Breast cancer Paternal Aunt   . Stomach cancer Neg Hx   . Rectal cancer Neg Hx   . Colon cancer Neg Hx   . Esophageal cancer Neg Hx     ALLERGIES:  is allergic to betadine [povidone iodine] and shellfish allergy.  MEDICATIONS:  Current Outpatient Medications  Medication Sig Dispense Refill  . ALPRAZolam (XANAX) 0.5 MG tablet Take 1 tablet (0.5 mg total) by mouth at  bedtime as needed for anxiety. 30 tablet 1  . aprepitant (EMEND) 125 MG capsule Take one capsule by mouth twice daily with onset of symptoms 5 capsule 0  . cyclobenzaprine (FLEXERIL) 5 MG tablet Take 5 mg by mouth 3 (three) times daily as needed for muscle spasms.    . AMBULATORY NON FORMULARY MEDICATION Medication Name: Nitroglycerine ointment 0.125 %  Apply a pea sized amount internally three times daily for 4 to 6 weeks Dispense 30 GM zero refill 30 g 1  . ondansetron (ZOFRAN-ODT) 4 MG disintegrating tablet Take 4 mg by mouth every 8 (eight) hours as needed for nausea or vomiting.    Marland Kitchen  promethazine (PHENERGAN) 25 MG tablet Take one tablet by mouth at onset of nausea 10 tablet 0  . STOOL SOFTENER 100 MG capsule TAKE 1 CAPSULE(100 MG) BY MOUTH TWICE DAILY 60 capsule 0   Current Facility-Administered Medications  Medication Dose Route Frequency Provider Last Rate Last Admin  . etonogestrel (NEXPLANON) implant 68 mg  68 mg Subdermal Once Dalia Heading, CNM        REVIEW OF SYSTEMS:   Constitutional: Denies fevers, chills or abnormal night sweats (+) 12 lbs weight loss in 3 months  Eyes: Denies blurriness of vision, double vision or watery eyes Ears, nose, mouth, throat, and face: Denies mucositis or sore throat Respiratory: Denies cough, dyspnea or wheezes Cardiovascular: Denies palpitation, chest discomfort or lower extremity swelling Gastrointestinal:  Denies change in bowel habits, melena, hematochezia, heartburn or change in bowel habits (+) cyclic N/V abdominal pain  Skin: Denies abnormal skin rashes Lymphatics: Denies new lymphadenopathy or easy bruising Neurological:Denies numbness, tingling or new weaknesses Behavioral/Psych: Mood is stable, no new changes (+) h/o anxiety and depression  All other systems were reviewed with the patient and are negative.  PHYSICAL EXAMINATION: ECOG PERFORMANCE STATUS: 0 - Asymptomatic  Vitals:   04/30/20 1401  BP: 118/90  Pulse: 95    Resp: 18  Temp: 98.1 F (36.7 C)  SpO2: 100%   Filed Weights   04/30/20 1401  Weight: 140 lb 9.6 oz (63.8 kg)    GENERAL:alert, no distress and comfortable SKIN: no rash to exposed skin  EYES:  sclera clear NECK: without mass  LYMPH:  no palpable cervical, supraclavicular or axillary lymphadenopathy  LUNGS: clear with normal breathing effort HEART: regular rate & rhythm, no lower extremity edema ABDOMEN:abdomen soft, non-tender and normal bowel sounds. No hepatosplenomegaly  Musculoskeletal: no cyanosis of digits and no clubbing  PSYCH: alert & oriented x 3 with fluent speech NEURO: no focal motor/sensory deficits  LABORATORY DATA:  I have reviewed the data as listed CBC Latest Ref Rng & Units 04/30/2020 04/06/2020 10/03/2019  WBC 4.0 - 10.5 K/uL 10.2 15.4(H) 11.2(H)  Hemoglobin 12.0 - 15.0 g/dL 14.8 16.1(H) 15.3(H)  Hematocrit 36 - 46 % 42.7 46.0 44.5  Platelets 150 - 400 K/uL 233 264 214.0    CMP Latest Ref Rng & Units 04/06/2020 11/15/2019 10/03/2019  Glucose 70 - 99 mg/dL 125(H) - -  BUN 6 - 20 mg/dL 14 - -  Creatinine 0.44 - 1.00 mg/dL 0.90 - -  Sodium 135 - 145 mmol/L 141 - -  Potassium 3.5 - 5.1 mmol/L 4.0 - -  Chloride 98 - 111 mmol/L 104 - -  CO2 22 - 32 mmol/L 23 - -  Calcium 8.9 - 10.3 mg/dL 10.0 - -  Total Protein 6.5 - 8.1 g/dL 8.7(H) 7.7 7.7  Total Bilirubin 0.3 - 1.2 mg/dL 2.6(H) 1.4(H) 1.4(H)  Alkaline Phos 38 - 126 U/L 87 103 103  AST 15 - 41 U/L 19 18 32  ALT 0 - 44 U/L 33 35 61(H)     RADIOGRAPHIC STUDIES: I have personally reviewed the radiological images as listed and agreed with the findings in the report. No results found.  ASSESSMENT & PLAN: 34 yo female with anxiety and depression  1. Leukocytosis, likely reactive   -We reviewed her outside medical record and recent labs/imaging in detail. She had a normal CBC in 2020 and 2013, she developed a mild leukocytosis approx 8 months ago, WBC 11-16 with predominant neutrophilia and elevated  lymphocytes.  She notes labs were  done during are just after GI spell which includes N/V and abdominal pain. Work up for this is ongoing.  -we discussed the common etiologies of leukocytosis, including primary cause such as myeloproliferative disorder or CML, CLL and secondary causes such as reactive to inflammation/infection, smoking, drugs like steroids, etc.  -we feel CML/CLL is less likely give her age and no other symptoms. We are not recommending additional testing such as BCR-ABL at this time.  -she is a non-smoker, no recent steroids or other med changes, denies allergies or chronic/recurrent infections.  -Her recent CBCs were obtained during GI episodes, last one in 04/06/20.  -Exam is benign. Today's CBC and morphology WNL. Her leukocytosis is reactive.  -We are not recommending any additional heme work up at this time. She can f/u with PCP and GI. We will see her back if needed in the future.    2. Family history of cancers -reportedly BRCA negative -we discussed there are other genes that can increase risk of certain cancers and I recommend a more extensive/comprehensive genetic panel. She will f/u with PCP to see what test she had done.  -I am happy to refer her to our genetic counselors at [facility], she will let me know -I reviewed age appropriate cancer screening guidelines and encouraged her to do self breast exam   3. N/V and abdominal pain -Onset 4 years ago, more frequent in last 2 years -occurs for 8-10 hours q3-5 months lately, she has lost some weight 12 lbs in 3 months  -Autoimmune work up, Hep A/B/C on 05/06/19 negative/normal  -endoscopies and multiple imaging including abdominal MRI unrevealing  -Continue f/u with GI Dr. Havery Moros, second opinion is pending   PLAN: -Reviewed outside records and recent lab/imaging  -CBC today WNL, leukocytosis is reactive -No further heme work up or follow up needed at this time -Continue f/u with GI, PCP -Discuss genetic testing with  PCP, will refer her to genetics if needed. Patient will let me know -Discussed age appropriate cancer screenings and guidelines  -F/u open    No problem-specific Assessment & Plan notes found for this encounter.  Orders Placed This Encounter  Procedures  . CBC with Differential (Cancer Center Only)    Standing Status:   Standing    Number of Occurrences:   10    Standing Expiration Date:   04/30/2021  . Save Smear (SSMR)    Standing Status:   Standing    Number of Occurrences:   1    Standing Expiration Date:   04/30/2021  . Technologist smear review    Standing Status:   Standing    Number of Occurrences:   1    Standing Expiration Date:   04/30/2021  . Pathologist smear review    Leukocytosis, r/o CML    Standing Status:   Future    Number of Occurrences:   1    Standing Expiration Date:   04/30/2021     All questions were answered. The patient knows to call the clinic with any problems, questions or concerns.     Alla Feeling, NP 05/01/20   Addendum  I have seen the patient, examined her. I agree with the assessment and and plan and have edited the notes.   34 yo female with PMH of episodic abdominal pain, nausea and vomiting, but otherwise healthy, was referred for evaluation of leukocytosis.  This is new since earlier 2021.  Previous WBC diff showed elevated neutrophils and lymphocytes.  No increased eosinophils,  basophils or abnormal cells seen. Her physical exam was unremarkable. This is likely reactive.  I will repeat her CBC and diff today, and will review her peripheral blood smear.  I have low suspicion for CML or CLL, will hold on further testing at this point.  If her CBC and differential is normal today, no follow-up is needed. All questions were answered.   Truitt Merle  04/30/2020

## 2020-05-01 ENCOUNTER — Encounter: Payer: Self-pay | Admitting: Nurse Practitioner

## 2020-05-01 LAB — PATHOLOGIST SMEAR REVIEW

## 2020-05-05 ENCOUNTER — Telehealth: Payer: Self-pay

## 2020-05-05 NOTE — Telephone Encounter (Signed)
-----   Message from Pollyann Samples, NP sent at 05/01/2020  9:15 AM EDT ----- Please let her know yesterday's CBC was completely normal, which shows her leukocytosis is reactive. No further heme work up or follow up needed. I will forward to PCP.   We can see her back as needed in the future.  Thanks, Clayborn Heron

## 2020-05-05 NOTE — Telephone Encounter (Signed)
Lab results from 04/30/2020 faxed to PCP office via Epic

## 2020-05-05 NOTE — Telephone Encounter (Signed)
I left vm with lacie's comments.

## 2020-09-10 ENCOUNTER — Ambulatory Visit: Payer: Managed Care, Other (non HMO) | Admitting: Adult Health

## 2020-09-10 ENCOUNTER — Encounter: Payer: Self-pay | Admitting: Adult Health

## 2020-09-10 ENCOUNTER — Other Ambulatory Visit: Payer: Self-pay

## 2020-09-10 VITALS — BP 121/80 | HR 115 | Temp 99.7°F | Resp 16 | Ht 61.0 in | Wt 147.6 lb

## 2020-09-10 DIAGNOSIS — Z1389 Encounter for screening for other disorder: Secondary | ICD-10-CM | POA: Diagnosis not present

## 2020-09-10 DIAGNOSIS — Z6827 Body mass index (BMI) 27.0-27.9, adult: Secondary | ICD-10-CM | POA: Diagnosis not present

## 2020-09-10 DIAGNOSIS — E559 Vitamin D deficiency, unspecified: Secondary | ICD-10-CM

## 2020-09-10 DIAGNOSIS — H6502 Acute serous otitis media, left ear: Secondary | ICD-10-CM | POA: Diagnosis not present

## 2020-09-10 DIAGNOSIS — R82998 Other abnormal findings in urine: Secondary | ICD-10-CM

## 2020-09-10 DIAGNOSIS — Z23 Encounter for immunization: Secondary | ICD-10-CM

## 2020-09-10 DIAGNOSIS — R599 Enlarged lymph nodes, unspecified: Secondary | ICD-10-CM

## 2020-09-10 DIAGNOSIS — R112 Nausea with vomiting, unspecified: Secondary | ICD-10-CM

## 2020-09-10 LAB — POCT URINALYSIS DIPSTICK
Bilirubin, UA: NEGATIVE
Blood, UA: NEGATIVE
Glucose, UA: NEGATIVE
Ketones, UA: NEGATIVE
Nitrite, UA: NEGATIVE
Protein, UA: NEGATIVE
Spec Grav, UA: 1.01 (ref 1.010–1.025)
Urobilinogen, UA: 0.2 E.U./dL
pH, UA: 6.5 (ref 5.0–8.0)

## 2020-09-10 MED ORDER — AMITRIPTYLINE HCL 150 MG PO TABS: 150.0000 mg | ORAL_TABLET | Freq: Every evening | ORAL | 0 refills | Status: DC | PRN

## 2020-09-10 MED ORDER — AMOXICILLIN-POT CLAVULANATE 875-125 MG PO TABS: 1.0000 | ORAL_TABLET | Freq: Two times a day (BID) | ORAL | 0 refills | Status: DC

## 2020-09-10 NOTE — Progress Notes (Signed)
Small amount of bacteria sent for urine culture.

## 2020-09-10 NOTE — Patient Instructions (Addendum)
Amitriptyline tablets What is this medicine? AMITRIPTYLINE (a mee TRIP ti leen) is used to treat depression. This medicine may be used for other purposes; ask your health care provider or pharmacist if you have questions. COMMON BRAND NAME(S): Elavil, Vanatrip What should I tell my health care provider before I take this medicine? They need to know if you have any of these conditions:  an alcohol problem  asthma, difficulty breathing  bipolar disorder or schizophrenia  difficulty passing urine, prostate trouble  glaucoma  heart disease or previous heart attack  liver disease  over active thyroid  seizures  thoughts or plans of suicide, a previous suicide attempt, or family history of suicide attempt  an unusual or allergic reaction to amitriptyline, other medicines, foods, dyes, or preservatives  pregnant or trying to get pregnant  breast-feeding How should I use this medicine? Take this medicine by mouth with a drink of water. Follow the directions on the prescription label. You can take the tablets with or without food. Take your medicine at regular intervals. Do not take it more often than directed. Do not stop taking this medicine suddenly except upon the advice of your doctor. Stopping this medicine too quickly may cause serious side effects or your condition may worsen. A special MedGuide will be given to you by the pharmacist with each prescription and refill. Be sure to read this information carefully each time. Talk to your pediatrician regarding the use of this medicine in children. Special care may be needed. Overdosage: If you think you have taken too much of this medicine contact a poison control center or emergency room at once. NOTE: This medicine is only for you. Do not share this medicine  with others. What if I miss a dose? If you miss a dose, take it as soon as you can. If it is almost time for your next dose, take only that dose. Do not take double or extra doses. What may interact with this medicine? Do not take this medicine with any of the following medications:  arsenic trioxide  certain medicines used to regulate abnormal heartbeat or to treat other heart conditions  cisapride  droperidol  halofantrine  linezolid  MAOIs like Carbex, Eldepryl, Marplan, Nardil, and Parnate  methylene blue  other medicines for mental depression  phenothiazines like perphenazine, thioridazine and chlorpromazine  pimozide  probucol  procarbazine  sparfloxacin  St. John's Wort This medicine may also interact with the following medications:  atropine and related drugs like hyoscyamine, scopolamine, tolterodine and others  barbiturate medicines for inducing sleep or treating seizures, like phenobarbital  cimetidine  disulfiram  ethchlorvynol  thyroid hormones such as levothyroxine  ziprasidone This list may not describe all possible interactions. Give your health care provider a list of all the medicines, herbs, non-prescription drugs, or dietary supplements you use. Also tell them if you smoke, drink alcohol, or use illegal drugs. Some items may interact with your medicine. What should I watch for while using this medicine? Tell  your doctor if your symptoms do not get better or if they get worse. Visit your doctor or health care professional for regular checks on your progress. Because it may take several weeks to see the full effects of this medicine, it is important to continue your treatment as prescribed by your doctor. Patients and their families should watch out for new or worsening thoughts of suicide or depression. Also watch out for sudden changes in feelings such as feeling anxious, agitated, panicky, irritable, hostile, aggressive, impulsive, severely  restless, overly excited and hyperactive, or not being able to sleep. If this happens, especially at the beginning of treatment or after a change in dose, call your health care professional. Bonita Quin may get drowsy or dizzy. Do not drive, use machinery, or do anything that needs mental alertness until you know how this medicine affects you. Do not stand or sit up quickly, especially if you are an older patient. This reduces the risk of dizzy or fainting spells. Alcohol may interfere with the effect of this medicine. Avoid alcoholic drinks. Do not treat yourself for coughs, colds, or allergies without asking your doctor or health care professional for advice. Some ingredients can increase possible side effects. Your mouth may get dry. Chewing sugarless gum or sucking hard candy, and drinking plenty of water will help. Contact your doctor if the problem does not go away or is severe. This medicine may cause dry eyes and blurred vision. If you wear contact lenses you may feel some discomfort. Lubricating drops may help. See your eye doctor if the problem does not go away or is severe. This medicine can cause constipation. Try to have a bowel movement at least every 2 to 3 days. If you do not have a bowel movement for 3 days, call your doctor or health care professional. This medicine can make you more sensitive to the sun. Keep out of the sun. If you cannot avoid being in the sun, wear protective clothing and use sunscreen. Do not use sun lamps or tanning beds/booths. What side effects may I notice from receiving this medicine? Side effects that you should report to your doctor or health care professional as soon as possible:  allergic reactions like skin rash, itching or hives, swelling of the face, lips, or tongue  anxious  breathing problems  changes in vision  confusion  elevated mood, decreased need for sleep, racing thoughts, impulsive behavior  eye pain  fast, irregular heartbeat  feeling  faint or lightheaded, falls  feeling agitated, angry, or irritable  fever with increased sweating  hallucination, loss of contact with reality  seizures  stiff muscles  suicidal thoughts or other mood changes  tingling, pain, or numbness in the feet or hands  trouble passing urine or change in the amount of urine  trouble sleeping  unusually weak or tired  vomiting  yellowing of the eyes or skin Side effects that usually do not require medical attention (report to your doctor or health care professional if they continue or are bothersome):  change in sex drive or performance  change in appetite or weight  constipation  dizziness  dry mouth  nausea  tired  tremors  upset stomach This list may not describe all possible side effects. Call your doctor for medical advice about side effects. You may report side effects to FDA at 1-800-FDA-1088. Where should I keep my medicine? Keep out of the reach of children. Store at room temperature between 20 and 25 degrees C (68 and  77 degrees F). Throw away any unused medicine after the expiration date. NOTE: This sheet is a summary. It may not cover all possible information. If you have questions about this medicine, talk to your doctor, pharmacist, or health care provider.  2021 Elsevier/Gold Standard (2018-07-17 13:04:32) Health Maintenance, Female Adopting a healthy lifestyle and getting preventive care are important in promoting health and wellness. Ask your health care provider about:  The right schedule for you to have regular tests and exams.  Things you can do on your own to prevent diseases and keep yourself healthy. What should I know about diet, weight, and exercise? Eat a healthy diet  Eat a diet that includes plenty of vegetables, fruits, low-fat dairy products, and lean protein.  Do not eat a lot of foods that are high in solid fats, added sugars, or sodium.   Maintain a healthy weight Body mass index  (BMI) is used to identify weight problems. It estimates body fat based on height and weight. Your health care provider can help determine your BMI and help you achieve or maintain a healthy weight. Get regular exercise Get regular exercise. This is one of the most important things you can do for your health. Most adults should:  Exercise for at least 150 minutes each week. The exercise should increase your heart rate and make you sweat (moderate-intensity exercise).  Do strengthening exercises at least twice a week. This is in addition to the moderate-intensity exercise.  Spend less time sitting. Even light physical activity can be beneficial. Watch cholesterol and blood lipids Have your blood tested for lipids and cholesterol at 36 years of age, then have this test every 5 years. Have your cholesterol levels checked more often if:  Your lipid or cholesterol levels are high.  You are older than 36 years of age.  You are at high risk for heart disease. What should I know about cancer screening? Depending on your health history and family history, you may need to have cancer screening at various ages. This may include screening for:  Breast cancer.  Cervical cancer.  Colorectal cancer.  Skin cancer.  Lung cancer. What should I know about heart disease, diabetes, and high blood pressure? Blood pressure and heart disease  High blood pressure causes heart disease and increases the risk of stroke. This is more likely to develop in people who have high blood pressure readings, are of African descent, or are overweight.  Have your blood pressure checked: ? Every 3-5 years if you are 20-72 years of age. ? Every year if you are 57 years old or older. Diabetes Have regular diabetes screenings. This checks your fasting blood sugar level. Have the screening done:  Once every three years after age 15 if you are at a normal weight and have a low risk for diabetes.  More often and at a  younger age if you are overweight or have a high risk for diabetes. What should I know about preventing infection? Hepatitis B If you have a higher risk for hepatitis B, you should be screened for this virus. Talk with your health care provider to find out if you are at risk for hepatitis B infection. Hepatitis C Testing is recommended for:  Everyone born from [birth year] through 1966.  Anyone with known risk factors for hepatitis C. Sexually transmitted infections (STIs)  Get screened for STIs, including gonorrhea and chlamydia, if: ? You are sexually active and are younger than 36 years of age. ? You are older  than 36 years of age and your health care provider tells you that you are at risk for this type of infection. ? Your sexual activity has changed since you were last screened, and you are at increased risk for chlamydia or gonorrhea. Ask your health care provider if you are at risk.  Ask your health care provider about whether you are at high risk for HIV. Your health care provider may recommend a prescription medicine to help prevent HIV infection. If you choose to take medicine to prevent HIV, you should first get tested for HIV. You should then be tested every 3 months for as long as you are taking the medicine. Pregnancy  If you are about to stop having your period (premenopausal) and you may become pregnant, seek counseling before you get pregnant.  Take 400 to 800 micrograms (mcg) of folic acid every day if you become pregnant.  Ask for birth control (contraception) if you want to prevent pregnancy. Osteoporosis and menopause Osteoporosis is a disease in which the bones lose minerals and strength with aging. This can result in bone fractures. If you are 36 years old or older, or if you are at risk for osteoporosis and fractures, ask your health care provider if you should:  Be screened for bone loss.  Take a calcium or vitamin D supplement to lower your risk of fractures.  Be  given hormone replacement therapy (HRT) to treat symptoms of menopause. Follow these instructions at home: Lifestyle  Do not use any products that contain nicotine or tobacco, such as cigarettes, e-cigarettes, and chewing tobacco. If you need help quitting, ask your health care provider.  Do not use street drugs.  Do not share needles.  Ask your health care provider for help if you need support or information about quitting drugs. Alcohol use  Do not drink alcohol if: ? Your health care provider tells you not to drink. ? You are pregnant, may be pregnant, or are planning to become pregnant.  If you drink alcohol: ? Limit how much you use to 0-1 drink a day. ? Limit intake if you are breastfeeding.  Be aware of how much alcohol is in your drink. In the [place] one drink equals one 12 oz bottle of beer (355 mL), one 5 oz glass of wine (148 mL), or one 1 oz glass of hard liquor (44 mL). General instructions  Schedule regular health, dental, and eye exams.  Stay current with your vaccines.  Tell your health care provider if: ? You often feel depressed. ? You have ever been abused or do not feel safe at home. Summary  Adopting a healthy lifestyle and getting preventive care are important in promoting health and wellness.  Follow your health care provider's instructions about healthy diet, exercising, and getting tested or screened for diseases.  Follow your health care provider's instructions on monitoring your cholesterol and blood pressure. This information is not intended to replace advice given to you by your health care provider. Make sure you discuss any questions you have with your health care provider. Document Revised: 07/18/2018 Document Reviewed: 07/18/2018 Elsevier Patient Education  2021 [employer]. Lymphadenopathy  Lymphadenopathy means that your lymph glands are swollen or larger than normal. Lymph glands, also called lymph nodes, are collections of tissue that  filter excess fluid, bacteria, viruses, and waste from your bloodstream. They are part of your body's disease-fighting system (immune system), which protects your body from germs. There may be different causes of lymphadenopathy, depending on  where it is in your body. Some types go away on their own. Lymphadenopathy can occur anywhere that you have lymph glands, including these areas:  Neck (cervical lymphadenopathy).  Chest (mediastinal lymphadenopathy).  Lungs (hilar lymphadenopathy).  Underarms (axillary lymphadenopathy).  Groin (inguinal lymphadenopathy). When your immune system responds to germs, infection-fighting cells and fluid build up in your lymph glands. This causes some swelling and enlargement. If the lymph nodes do not go back to normal size after you have an infection or disease, your health care provider may do tests. These tests help to monitor your condition and find the reason why the glands are still swollen and enlarged. Follow these instructions at home:  Get plenty of rest.  Your health care provider may recommend over-the-counter medicines for pain. Take over-the-counter and prescription medicines only as told by your health care provider.  If directed, apply heat to swollen lymph glands as often as told by your health care provider. Use the heat source that your health care provider recommends, such as a moist heat pack or a heating pad. ? Place a towel between your skin and the heat source. ? Leave the heat on for 20-30 minutes. ? Remove the heat if your skin turns bright red. This is especially important if you are unable to feel pain, heat, or cold. You may have a greater risk of getting burned.  Check your affected lymph glands every day for changes. Check other lymph gland areas as told by your health care provider. Check for changes such as: ? More swelling. ? Sudden increase in size. ? Redness or pain. ? Hardness.  Keep all follow-up visits. This is  important.   Contact a health care provider if you have:  Lymph glands that: ? Are still swollen after 2 weeks. ? Have suddenly gotten bigger or the swelling spreads. ? Are red, painful, or hard.  Fluid leaking from the skin near an enlarged lymph gland.  Problems with breathing.  A fever, chills, or night sweats.  Fatigue.  A sore throat.  Pain in your abdomen.  Weight loss. Get help right away if you have:  Severe pain.  Chest pain.  Shortness of breath. These symptoms may represent a serious problem that is an emergency. Do not wait to see if the symptoms will go away. Get medical help right away. Call your local emergency services (911 in the [place] Do not drive yourself to the hospital. Summary  Lymphadenopathy means that your lymph glands are swollen or larger than normal.  Lymph glands, also called lymph nodes, are collections of tissue that filter excess fluid, bacteria, viruses, and waste from the bloodstream. They are part of your body's disease-fighting system (immune system).  Lymphadenopathy can occur anywhere that you have lymph glands.  If the lymph nodes do not go back to normal size after you have an infection or disease, your health care provider may do tests to monitor your condition and find the reason why the glands are still swollen and enlarged.  Check your affected lymph glands every day for changes. Check other lymph gland areas as told by your health care provider. This information is not intended to replace advice given to you by your health care provider. Make sure you discuss any questions you have with your health care provider. Document Revised: 05/20/2020 Document Reviewed: 05/20/2020 Elsevier Patient Education  2021 [employer] Inc. Amoxicillin; Clavulanic Acid Tablets What is this medicine? AMOXICILLIN; CLAVULANIC ACID (a mox i SIL in; KLAV yoo lan ic  AS id) is a penicillin antibiotic. It treats some infections caused by bacteria. It will  not work for colds, the flu, or other viruses. This medicine may be used for other purposes; ask your health care provider or pharmacist if you have questions. COMMON BRAND NAME(S): Augmentin What should I tell my health care provider before I take this medicine? They need to know if you have any of these conditions:  kidney disease  liver disease  mononucleosis  stomach or intestine problems such as colitis  an unusual or allergic reaction to amoxicillin, other penicillin or cephalosporin antibiotics, clavulanic acid, other medicines, foods, dyes, or preservatives  pregnant or trying to get pregnant  breast-feeding How should I use this medicine? Take this drug by mouth. Take it as directed on the prescription label at the same time every day. Take it with food at the start of a meal or snack. Take all of this drug unless your health care provider tells you to stop it early. Keep taking it even if you think you are better. Talk to your health care provider about the use of this drug in children. While it may be prescribed for selected conditions, precautions do apply. Overdosage: If you think you have taken too much of this medicine contact a poison control center or emergency room at once. NOTE: This medicine is only for you. Do not share this medicine with others. What if I miss a dose? If you miss a dose, take it as soon as you can. If it is almost time for your next dose, take only that dose. Do not take double or extra doses. What may interact with this medicine?  allopurinol  anticoagulants  birth control pills  methotrexate  probenecid This list may not describe all possible interactions. Give your health care provider a list of all the medicines, herbs, non-prescription drugs, or dietary supplements you use. Also tell them if you smoke, drink alcohol, or use illegal drugs. Some items may interact with your medicine. What should I watch for while using this  medicine? Tell your health care provider if your symptoms do not start to get better or if they get worse. This medicine may cause serious skin reactions. They can happen weeks to months after starting the medicine. Contact your health care provider right away if you notice fevers or flu-like symptoms with a rash. The rash may be red or purple and then turn into blisters or peeling of the skin. Or, you might notice a red rash with swelling of the face, lips or lymph nodes in your neck or under your arms. Do not treat diarrhea with over the counter products. Contact your health care provider if you have diarrhea that lasts more than 2 days or if it is severe and watery. If you have diabetes, you may get a false-positive result for sugar in your urine. Check with your health care provider. Birth control may not work properly while you are taking this medicine. Talk to your health care provider about using an extra method of birth control. What side effects may I notice from receiving this medicine? Side effects that you should report to your doctor or health care provider as soon as possible:  allergic reactions (skin rash, itching or hives; swelling of the face, lips, or tongue)  bloody or watery diarrhea  dark urine  infection (fever, chills, cough, sore throat, or pain)  kidney injury (trouble passing urine or change in the amount of urine)  redness, blistering,  peeling, or loosening of the skin, including inside the mouth  seizures  thrush (white patches in the mouth or mouth sores)  trouble breathing  unusual bruising or bleeding  unusually weak or tired Side effects that usually do not require medical attention (report to your doctor or health care provider if they continue or are bothersome):  diarrhea  dizziness  headache  nausea, vomiting  unusual vaginal discharge, itching, or odor  upset stomach This list may not describe all possible side effects. Call your doctor  for medical advice about side effects. You may report side effects to FDA at 1-800-FDA-1088. Where should I keep my medicine? Keep out of the reach of children and pets. Store at room temperature between 20 and 25 degrees C (68 and 77 degrees F). Throw away any unused drug after the expiration date. NOTE: This sheet is a summary. It may not cover all possible information. If you have questions about this medicine, talk to your doctor, pharmacist, or health care provider.  2021 Elsevier/Gold Standard (2020-06-17 09:45:03)   Fat and Cholesterol Restricted Eating Plan Getting too much fat and cholesterol in your diet may cause health problems. Choosing the right foods helps keep your fat and cholesterol at normal levels. This can keep you from getting certain diseases. Your doctor may recommend an eating plan that includes:  Total fat: ______% or less of total calories a day.  Saturated fat: ______% or less of total calories a day.  Cholesterol: less than _________mg a day.  Fiber: ______g a day. What are tips for following this plan? Meal planning  At meals, divide your plate into four equal parts: ? Fill one-half of your plate with vegetables and green salads. ? Fill one-fourth of your plate with whole grains. ? Fill one-fourth of your plate with low-fat (lean) protein foods.  Eat fish that is high in omega-3 fats at least two times a week. This includes mackerel, tuna, sardines, and salmon.  Eat foods that are high in fiber, such as whole grains, beans, apples, broccoli, carrots, peas, and barley. General tips  Work with your doctor to lose weight if you need to.  Avoid: ? Foods with added sugar. ? Fried foods. ? Foods with partially hydrogenated oils.  Limit alcohol intake to no more than 1 drink a day for nonpregnant women and 2 drinks a day for men. One drink equals 12 oz of beer, 5 oz of wine, or 1 oz of hard liquor.   Reading food labels  Check food labels  for: ? Trans fats. ? Partially hydrogenated oils. ? Saturated fat (g) in each serving. ? Cholesterol (mg) in each serving. ? Fiber (g) in each serving.  Choose foods with healthy fats, such as: ? Monounsaturated fats. ? Polyunsaturated fats. ? Omega-3 fats.  Choose grain products that have whole grains. Look for the word "whole" as the first word in the ingredient list. Cooking  Cook foods using low-fat methods. These include baking, boiling, grilling, and broiling.  Eat more home-cooked foods. Eat at restaurants and buffets less often.  Avoid cooking using saturated fats, such as butter, cream, palm oil, palm kernel oil, and coconut oil. Recommended foods Fruits  All fresh, canned (in natural juice), or frozen fruits. Vegetables  Fresh or frozen vegetables (raw, steamed, roasted, or grilled). Green salads. Grains  Whole grains, such as whole wheat or whole grain breads, crackers, cereals, and pasta. Unsweetened oatmeal, bulgur, barley, quinoa, or [place] rice. Corn or whole wheat flour tortillas. Meats  and other protein foods  Ground beef (85% or leaner), grass-fed beef, or beef trimmed of fat. Skinless chicken or [place]. Ground chicken or [place]. Pork trimmed of fat. All fish and seafood. Egg whites. Dried beans, peas, or lentils. Unsalted nuts or seeds. Unsalted canned beans. Nut butters without added sugar or oil. Dairy  Low-fat or nonfat dairy products, such as skim or 1% milk, 2% or reduced-fat cheeses, low-fat and fat-free ricotta or cottage cheese, or plain low-fat and nonfat yogurt. Fats and oils  Tub margarine without trans fats. Light or reduced-fat mayonnaise and salad dressings. Avocado. Olive, canola, sesame, or safflower oils. The items listed above may not be a complete list of foods and beverages you can eat. Contact a dietitian for more information.   Foods to avoid Fruits  Canned fruit in heavy syrup. Fruit in cream or butter sauce. Fried  fruit. Vegetables  Vegetables cooked in cheese, cream, or butter sauce. Fried vegetables. Grains  White bread. White pasta. White rice. Cornbread. Bagels, pastries, and croissants. Crackers and snack foods that contain trans fat and hydrogenated oils. Meats and other protein foods  Fatty cuts of meat. Ribs, chicken wings, bacon, sausage, bologna, salami, chitterlings, fatback, hot dogs, bratwurst, and packaged lunch meats. Liver and organ meats. Whole eggs and egg yolks. Chicken and [place] with skin. Fried meat. Dairy  Whole or 2% milk, cream, half-and-half, and cream cheese. Whole milk cheeses. Whole-fat or sweetened yogurt. Full-fat cheeses. Nondairy creamers and whipped toppings. Processed cheese, cheese spreads, and cheese curds. Beverages  Alcohol. Sugar-sweetened drinks such as sodas, lemonade, and fruit drinks. Fats and oils  Butter, stick margarine, lard, shortening, ghee, or bacon fat. Coconut, palm kernel, and palm oils. Sweets and desserts  Corn syrup, sugars, honey, and molasses. Candy. Jam and jelly. Syrup. Sweetened cereals. Cookies, pies, cakes, donuts, muffins, and ice cream. The items listed above may not be a complete list of foods and beverages you should avoid. Contact a dietitian for more information. Summary  Choosing the right foods helps keep your fat and cholesterol at normal levels. This can keep you from getting certain diseases.  At meals, fill one-half of your plate with vegetables and green salads.  Eat high-fiber foods, like whole grains, beans, apples, carrots, peas, and barley.  Limit added sugar, saturated fats, alcohol, and fried foods. This information is not intended to replace advice given to you by your health care provider. Make sure you discuss any questions you have with your health care provider. Document Revised: 11/27/2019 Document Reviewed: 11/27/2019 Elsevier Patient Education  2021 [employer].

## 2020-09-10 NOTE — Progress Notes (Addendum)
New patient visit   Patient: Rebecca Davies   DOB: 07-08-1986   35 y.o. Female  MRN: [id number] Visit Date: 09/10/2020  Today's healthcare provider: Marcille Buffy, FNP   Chief Complaint  Patient presents with  . New Patient (Initial Visit)   Subjective    Rebecca Davies is a 35 y.o. female who presents today as a new patient to establish care.  HPI  Patient comes in office today to establish care she comes to our practice from [facility].   Patient states that she feels fairly well today but would like to address concern of lump/knot that she has found on the left side of neck that has been present  Since after Christmas. Feels irritates, left side of her neck. She has had some ear pains.   Patient would also like to review over current medications she is on because for the past several weeks she has been experiencing dry mouth.  She is on EMEND prn for history of chronic cyclic vomiting and nausea- she has only had to take once in the last month. She usually has had cyclic nausea, vomiting, and  Abdominal pain. She is seeing gastrointestinal/ She is also was started on amitriptyline po PRN and she has gone 5 months without any nausea or vomiting. Dr. Alice Reichert and Dr. Reece Levy.    She has been seen by hematology and worked up - and no further work up advised was felt to be due to the nausea and vomiting.   Patient follows a general diet, is not exercising and states that sleep patterns are fairly well.   Patient reports that she goes to [facility] and reports that her last pap was 08/2020 and was normal.   No LMP recorded. Patient has had an implant.  She denied any need for  Xanax 0.5 mg tablet just as needed/ directed for anxiety she reports only taking occasionally.   Patient  denies any fever, body aches,chills, rash, chest pain, shortness of breath, current  Nausea or  vomiting, or diarrhea.   Denies dizziness, lightheadedness, pre syncopal or syncopal  episodes.    Past Medical History:  Diagnosis Date  . Anxiety   . Arthritis    FINGER, ELBOW  . BRCA negative 06/2013   MyRisk neg  . Chicken pox   . Depression   . Family history of breast cancer   . Family history of pancreatic cancer   . GERD (gastroesophageal reflux disease)    OCC- NO MEDS  . Migraine    H/O MIGRAINES   Past Surgical History:  Procedure Laterality Date  . HEMORRHOID SURGERY N/A 04/05/2019   Procedure: HEMORRHOIDECTOMY, EXCISION ANAL SKIN TAG;  Surgeon: Fredirick Maudlin, MD;  Location: [facility] ORS;  Service: General;  Laterality: N/A;  . WISDOM TOOTH EXTRACTION  2013?   Family Status  Relation Name Status  . Mother  Alive  . Father  Alive  . MGM  Alive  . PGF  Deceased  . Brother  Alive  . MGF  Alive  . PGM  Alive  . Brother  Alive  . Paternal GGM  (Not Specified)  . Ethlyn Daniels  (Not Specified)  . Neg Hx  (Not Specified)   Family History  Problem Relation Age of Onset  . Hyperlipidemia Mother   . Mental illness Mother        anxiety and depression  . Uterine cancer Mother   . Alcohol abuse Father   . Hypertension  Maternal Grandmother   . Heart disease Maternal Grandmother   . Stroke Maternal Grandmother   . Mental illness Maternal Grandmother   . Pancreatic cancer Maternal Grandmother 77       not genetic  . Alcohol abuse Paternal Grandfather   . Cancer Paternal Grandfather   . Breast cancer Paternal Grandmother   . Breast cancer Paternal Great-grandmother   . Breast cancer Paternal Aunt   . Stomach cancer Neg Hx   . Rectal cancer Neg Hx   . Colon cancer Neg Hx   . Esophageal cancer Neg Hx    Social History   Socioeconomic History  . Marital status: Single    Spouse name: Not on file  . Number of children: Not on file  . Years of education: Not on file  . Highest education level: Not on file  Occupational History  . Not on file  Tobacco Use  . Smoking status: Never Smoker  . Smokeless tobacco: Never Used  Vaping Use  . Vaping  Use: Never used  Substance and Sexual Activity  . Alcohol use: Yes    Alcohol/week: 1.0 standard drink    Types: 1 Standard drinks or equivalent per week    Comment: 1 glass of wine every few weeks.   . Drug use: No  . Sexual activity: Yes    Partners: Male    Birth control/protection: Implant  Other Topics Concern  . Not on file  Social History Narrative   From [place] and residing in [place]   1 Son    1 cat in the house   Enjoys movies, going to eat, beauty products    Social Determinants of Health   Financial Resource Strain: Not on file  Food Insecurity: Not on file  Transportation Needs: Not on file  Physical Activity: Not on file  Stress: Not on file  Social Connections: Not on file   Outpatient Medications Prior to Visit  Medication Sig  . ALPRAZolam (XANAX) 0.5 MG tablet Take 1 tablet (0.5 mg total) by mouth at bedtime as needed for anxiety.  . AMBULATORY NON FORMULARY MEDICATION Medication Name: Nitroglycerine ointment 0.125 %  Apply a pea sized amount internally three times daily for 4 to 6 weeks Dispense 30 GM zero refill  . aprepitant (EMEND) 125 MG capsule Take one capsule by mouth twice daily with onset of symptoms  . [DISCONTINUED] AMITRIPTYLINE HCL PO Take 125 mcg by mouth at bedtime as needed.  . [DISCONTINUED] cyclobenzaprine (FLEXERIL) 5 MG tablet Take 5 mg by mouth 3 (three) times daily as needed for muscle spasms.  . [DISCONTINUED] ondansetron (ZOFRAN-ODT) 4 MG disintegrating tablet Take 4 mg by mouth every 8 (eight) hours as needed for nausea or vomiting.  . [DISCONTINUED] promethazine (PHENERGAN) 25 MG tablet Take one tablet by mouth at onset of nausea  . [DISCONTINUED] STOOL SOFTENER 100 MG capsule TAKE 1 CAPSULE(100 MG) BY MOUTH TWICE DAILY   Facility-Administered Medications Prior to Visit  Medication Dose Route Frequency Provider  . etonogestrel (NEXPLANON) implant 68 mg  68 mg Subdermal Once Dalia Heading, [place]   Allergies  Allergen  Reactions  . Betadine [Povidone Iodine] Rash  . Shellfish Allergy Swelling and Rash    Immunization History  Administered Date(s) Administered  . Hepatitis A, Adult 05/16/2019, 11/15/2019  . Influenza Split 04/08/2014  . Influenza,inj,Quad PF,6+ Mos 07/15/2016, 05/10/2017, 09/19/2018, 09/10/2020  . Tdap 10/29/2015    Health Maintenance  Topic Date Due  . PAP SMEAR-Modifier  10/14/2022  . TETANUS/TDAP  10/28/2025  . INFLUENZA VACCINE  Completed  . Hepatitis C Screening  Completed  . HIV Screening  Completed    Patient Care Team: Dawnetta Copenhaver, Kelby Aline, FNP as PCP - General (Family Medicine) Armbruster, Carlota Raspberry, MD as Consulting Physician (Gastroenterology) Truitt Merle, MD as Consulting Physician (Hematology)  Review of Systems  Constitutional: Negative for activity change, appetite change, chills, diaphoresis, fatigue, fever and unexpected weight change.  HENT: Positive for ear pain. Negative for congestion, dental problem, drooling, ear discharge, facial swelling, hearing loss, mouth sores, nosebleeds, postnasal drip, rhinorrhea, sinus pressure, sinus pain, sneezing, sore throat, tinnitus, trouble swallowing and voice change.   Eyes: Negative for photophobia, pain, discharge, redness, itching and visual disturbance.  Respiratory: Negative.   Cardiovascular: Negative.   Gastrointestinal: Positive for abdominal pain, nausea and vomiting. Negative for abdominal distention, anal bleeding, blood in stool, constipation, diarrhea and rectal pain.  Genitourinary: Negative.   Musculoskeletal: Positive for back pain. Negative for arthralgias, gait problem, joint swelling, myalgias, neck pain and neck stiffness.  Skin: Negative.   Allergic/Immunologic: Positive for food allergies.  Neurological: Positive for tremors. Negative for dizziness, seizures, syncope, facial asymmetry, speech difficulty, weakness, light-headedness, numbness and headaches.  Hematological: Negative for adenopathy. Does  not bruise/bleed easily.  Psychiatric/Behavioral: Negative for agitation, behavioral problems, confusion, decreased concentration, dysphoric mood, hallucinations, self-injury, sleep disturbance and suicidal ideas. The patient is nervous/anxious. The patient is not hyperactive.     Last CBC Lab Results  Component Value Date   WBC 10.2 04/30/2020   HGB 14.8 04/30/2020   HCT 42.7 04/30/2020   MCV 85.1 04/30/2020   MCH 29.5 04/30/2020   RDW 12.0 04/30/2020   PLT 233 48/25/0037   Last metabolic panel Lab Results  Component Value Date   GLUCOSE 125 (H) 04/06/2020   NA 141 04/06/2020   K 4.0 04/06/2020   CL 104 04/06/2020   CO2 23 04/06/2020   BUN 14 04/06/2020   CREATININE 0.90 04/06/2020   GFRNONAA >60 04/06/2020   GFRAA >60 04/06/2020   CALCIUM 10.0 04/06/2020   PROT 8.7 (H) 04/06/2020   ALBUMIN 5.1 (H) 04/06/2020   BILITOT 2.6 (H) 04/06/2020   ALKPHOS 87 04/06/2020   AST 19 04/06/2020   ALT 33 04/06/2020   ANIONGAP 14 04/06/2020   Last lipids Lab Results  Component Value Date   CHOL 160 09/04/2014   HDL 35.70 (L) 09/04/2014   LDLDIRECT 99.0 09/04/2014   TRIG 301.0 (H) 09/04/2014   CHOLHDL 4 09/04/2014   Last hemoglobin A1c No results found for: HGBA1C Last thyroid functions Lab Results  Component Value Date   TSH 2.26 09/04/2014   Last vitamin D No results found for: 25OHVITD2, 25OHVITD3, VD25OH Last vitamin B12 and Folate No results found for: VITAMINB12, FOLATE    Objective    BP 121/80   Pulse (!) 115   Temp 99.7 F (37.6 C) (Oral)   Resp 16   Ht '5\' 1"'  (1.549 m)   Wt 147 lb 9.6 oz (67 kg)   BMI 27.89 kg/m  Physical Exam Vitals and nursing note reviewed.  Constitutional:      Appearance: Normal appearance. She is not ill-appearing.  HENT:     Head: Normocephalic and atraumatic.     Jaw: There is normal jaw occlusion.     Right Ear: Hearing, tympanic membrane, ear canal and external ear normal.     Left Ear: Hearing, ear canal and external ear  normal. A middle ear effusion (dark fluid ) is present.  Tympanic membrane is erythematous and bulging.     Nose: Nose normal.     Right Turbinates: Not enlarged, swollen or pale.     Left Turbinates: Not enlarged, swollen or pale.     Right Sinus: No maxillary sinus tenderness or frontal sinus tenderness.     Left Sinus: No maxillary sinus tenderness or frontal sinus tenderness.     Mouth/Throat:     Mouth: Mucous membranes are moist.  Eyes:     General: No scleral icterus.       Right eye: No discharge.        Left eye: No discharge.     Conjunctiva/sclera: Conjunctivae normal.     Pupils: Pupils are equal, round, and reactive to light.  Neck:     Thyroid: No thyroid mass.     Trachea: Trachea and phonation normal.   Cardiovascular:     Rate and Rhythm: Normal rate and regular rhythm.     Pulses: Normal pulses.     Heart sounds: Normal heart sounds. No murmur heard. No friction rub. No gallop.   Pulmonary:     Effort: Pulmonary effort is normal. No respiratory distress.     Breath sounds: Normal breath sounds. No stridor. No wheezing, rhonchi or rales.  Chest:     Chest wall: No tenderness.  Abdominal:     General: Bowel sounds are normal. There is no distension.     Palpations: Abdomen is soft. There is no mass.     Tenderness: There is no abdominal tenderness. There is no right CVA tenderness or guarding.  Genitourinary:    Comments: Deferred.  Musculoskeletal:        General: No tenderness. Normal range of motion.     Cervical back: Full passive range of motion without pain, normal range of motion and neck supple. No edema. No pain with movement.     Right lower leg: No edema.     Left lower leg: No edema.  Lymphadenopathy:     Cervical: Cervical adenopathy present.     Right cervical: No superficial, deep or posterior cervical adenopathy.    Left cervical: Posterior cervical adenopathy present. No superficial or deep cervical adenopathy.  Skin:    General: Skin is  warm.     Findings: No erythema, lesion or rash.  Neurological:     Mental Status: She is alert and oriented to person, place, and time.     Motor: No weakness.     Gait: Gait normal.  Psychiatric:        Mood and Affect: Mood normal.        Behavior: Behavior normal.        Thought Content: Thought content normal.        Judgment: Judgment normal.     Depression Screen PHQ 2/9 Scores 09/10/2020 04/07/2020 10/08/2019 02/20/2019  PHQ - 2 Score '1 2 1 ' 0  PHQ- 9 Score '5 4 3 ' 0   Results for orders placed or performed in visit on 09/10/20  POCT urinalysis dipstick  Result Value Ref Range   Color, UA yellow    Clarity, UA clear    Glucose, UA Negative Negative   Bilirubin, UA negative    Ketones, UA negative    Spec Grav, UA 1.010 1.010 - 1.025   Blood, UA negative    pH, UA 6.5 5.0 - 8.0   Protein, UA Negative Negative   Urobilinogen, UA 0.2 0.2 or 1.0 E.U./dL   Nitrite, UA negative  Leukocytes, UA Small (1+) (A) Negative   Appearance     Odor      Assessment & Plan      1. Body mass index 27.0-27.9, adult Dietary and lifestyle changes and continued exercise.  2. Nausea and vomiting, intractability of vomiting not specified, unspecified vomiting type Continue to keep follow-ups with your gastroenterologist.  Patient was prescribed amitriptyline 75 mg 1 to 2 tablets daily she has been having to take 2 tablets 150 mg total at bedtime as directed by her gastroenterologist.  She does request a refill on this because she was out of this prescription and knows to follow-up with gastroenterology for further refills.  Courtesy refill was given. - CBC with Differential/Platelet - Comprehensive metabolic panel - Lipid panel - TSH - amitriptyline (ELAVIL) 150 MG tablet; Take 1 tablet (150 mg total) by mouth at bedtime as needed.  Dispense: 30 tablet; Refill: 0  3. Vitamin D insufficiency  - VITAMIN D 25 Hydroxy (Vit-D Deficiency, Fractures)  4. Swelling of lymph node- left posterior  cervical.  Follow-up for recheck. - amoxicillin-clavulanate (AUGMENTIN) 875-125 MG tablet; Take 1 tablet by mouth 2 (two) times daily.  Dispense: 20 tablet; Refill: 0 - Mononucleosis Test, Qual W/ Reflex  5. Screening for blood or protein in urine  - POCT urinalysis dipstick  6. Leukocytes in urine No signs of urinary discomfort or urinary tract infection symptoms. - Urine Culture  7. Need for influenza vaccination Patient does want a flu shot today and was given. - Flu Vaccine QUAD 36+ mos IM  8. Dry mouth  Discussed increasing fluids and sucking on throat lozenges, and lemon drops, other mouth moistening over-the-counter products.  Discussed this is a common side effect with amitriptyline and if she cannot tolerate she can call her gastroenterologist to see where.  Advised that she is taking this for her cyclic nausea and vomiting.   Non-recurrent acute serous otitis media of left ear - Plan: amoxicillin-clavulanate (AUGMENTIN) 875-125 MG tablet  Body mass index 27.0-27.9, adult - Plan: Lipid panel  Nausea and vomiting, intractability of vomiting not specified, unspecified vomiting type- cyclic folllowedd by gastroenterology.  - Plan: CBC with Differential/Platelet, Comprehensive metabolic panel, Lipid panel, TSH, amitriptyline (ELAVIL) 150 MG tablet  Vitamin D insufficiency - Plan: VITAMIN D 25 Hydroxy (Vit-D Deficiency, Fractures)  Swelling of lymph node- left posterior cervical.  - Plan: amoxicillin-clavulanate (AUGMENTIN) 875-125 MG tablet, Mononucleosis Test, Qual W/ Reflex  Screening for blood or protein in urine - Plan: POCT urinalysis dipstick  Leukocytes in urine - Plan: Urine Culture  Need for influenza vaccination - Plan: Flu Vaccine QUAD 36+ mos IM   Return in about 1 month (around 10/08/2020), or if symptoms worsen or fail to improve, for at any time for any worsening symptoms, Go to Emergency room/ urgent care if worse.     The entirety of the information  documented in the History of Present Illness, Review of Systems and Physical Exam were personally obtained by me. Portions of this information were initially documented by the CMA and reviewed by me for thoroughness and accuracy.      Marcille Buffy, [place] [phone] (phone) [phone] (fax)  [place]

## 2020-09-12 LAB — CBC WITH DIFFERENTIAL/PLATELET
Basophils Absolute: 0.1 10*3/uL (ref 0.0–0.2)
Basos: 1 %
EOS (ABSOLUTE): 0.1 10*3/uL (ref 0.0–0.4)
Eos: 1 %
Hematocrit: 42 % (ref 34.0–46.6)
Hemoglobin: 14.7 g/dL (ref 11.1–15.9)
Immature Grans (Abs): 0 10*3/uL (ref 0.0–0.1)
Immature Granulocytes: 0 %
Lymphocytes Absolute: 3.4 10*3/uL — ABNORMAL HIGH (ref 0.7–3.1)
Lymphs: 42 %
MCH: 30 pg (ref 26.6–33.0)
MCHC: 35 g/dL (ref 31.5–35.7)
MCV: 86 fL (ref 79–97)
Monocytes Absolute: 0.5 10*3/uL (ref 0.1–0.9)
Monocytes: 6 %
Neutrophils Absolute: 4 10*3/uL (ref 1.4–7.0)
Neutrophils: 50 %
Platelets: 212 10*3/uL (ref 150–450)
RBC: 4.9 x10E6/uL (ref 3.77–5.28)
RDW: 11.8 % (ref 11.7–15.4)
WBC: 8.1 10*3/uL (ref 3.4–10.8)

## 2020-09-12 LAB — LIPID PANEL
Chol/HDL Ratio: 5.5 ratio — ABNORMAL HIGH (ref 0.0–4.4)
Cholesterol, Total: 175 mg/dL (ref 100–199)
HDL: 32 mg/dL — ABNORMAL LOW (ref >39)
LDL Chol Calc (NIH): 89 mg/dL (ref 0–99)
Triglycerides: 328 mg/dL — ABNORMAL HIGH (ref 0–149)
VLDL Cholesterol Cal: 54 mg/dL — ABNORMAL HIGH (ref 5–40)

## 2020-09-12 LAB — COMPREHENSIVE METABOLIC PANEL
ALT: 39 IU/L — ABNORMAL HIGH (ref 0–32)
AST: 22 IU/L (ref 0–40)
Albumin/Globulin Ratio: 1.8 (ref 1.2–2.2)
Albumin: 4.6 g/dL (ref 3.8–4.8)
Alkaline Phosphatase: 111 IU/L (ref 44–121)
BUN/Creatinine Ratio: 18 (ref 9–23)
BUN: 14 mg/dL (ref 6–20)
Bilirubin Total: 0.9 mg/dL (ref 0.0–1.2)
CO2: 22 mmol/L (ref 20–29)
Calcium: 9.7 mg/dL (ref 8.7–10.2)
Chloride: 101 mmol/L (ref 96–106)
Creatinine, Ser: 0.8 mg/dL (ref 0.57–1.00)
GFR calc Af Amer: 110 mL/min/{1.73_m2} (ref >59)
GFR calc non Af Amer: 96 mL/min/{1.73_m2} (ref >59)
Globulin, Total: 2.6 g/dL (ref 1.5–4.5)
Glucose: 88 mg/dL (ref 65–99)
Potassium: 4.2 mmol/L (ref 3.5–5.2)
Sodium: 140 mmol/L (ref 134–144)
Total Protein: 7.2 g/dL (ref 6.0–8.5)

## 2020-09-12 LAB — TSH: TSH: 1.15 u[IU]/mL (ref 0.450–4.500)

## 2020-09-12 LAB — URINE CULTURE

## 2020-09-12 LAB — VITAMIN D 25 HYDROXY (VIT D DEFICIENCY, FRACTURES): Vit D, 25-Hydroxy: 23.4 ng/mL — ABNORMAL LOW (ref 30.0–100.0)

## 2020-09-15 ENCOUNTER — Other Ambulatory Visit: Payer: Self-pay | Admitting: Adult Health

## 2020-09-15 DIAGNOSIS — E559 Vitamin D deficiency, unspecified: Secondary | ICD-10-CM

## 2020-09-15 MED ORDER — VITAMIN D (ERGOCALCIFEROL) 1.25 MG (50000 UNIT) PO CAPS: 50000.0000 [IU] | ORAL_CAPSULE | ORAL | 0 refills | Status: DC

## 2020-09-15 NOTE — Progress Notes (Signed)
Meds ordered this encounter  Medications   Vitamin D, Ergocalciferol, (DRISDOL) 1.25 MG (50000 UNIT) CAPS capsule    Sig: Take 1 capsule (50,000 Units total) by mouth every 7 (seven) days. (taking one tablet per week) walk in lab in office 1-2 weeks after completing prescription.    Dispense:  12 capsule    Refill:  0   Orders Placed This Encounter  Procedures   VITAMIN D 25 Hydroxy (Vit-D Deficiency, Fractures)     Vitamin D insufficiency - Plan: Vitamin D, Ergocalciferol, (DRISDOL) 1.25 MG (50000 UNIT) CAPS capsule, VITAMIN D 25 Hydroxy (Vit-D Deficiency, Fractures)

## 2020-09-15 NOTE — Progress Notes (Signed)
CBC is okay. ALT mildly elevated at 39 avoid alcohol and tylenol If able.   Total cholesterol m triglycerides and LDL elevated.  Discuss lifestyle modification with patient e.g. increase exercise, fiber, fruits, vegetables, lean meat, and omega 3/fish intake and decrease saturated fat.  If patient following strict diet and exercise program already please schedule follow up appointment with primary care physician   Tsh with thyroid within normal limits.  Vitamin  D is low, this can contribute to poor sleep and fatigue, will send in prescription for Vitamin D at 50,000 units by mouth once every 7 days/(once weekly) for 12 weeks. Advise recheck lab Vitamin D in 1-2 weeks after completing vitamin d prescription. Lab iis walk in and is closed during lunch during regular office hours.  Urine culture ok as long as no symptoms;  Mono test pending quantiferon.

## 2020-10-02 ENCOUNTER — Other Ambulatory Visit: Payer: Self-pay

## 2020-10-02 ENCOUNTER — Ambulatory Visit: Payer: Managed Care, Other (non HMO) | Admitting: Adult Health

## 2020-10-02 ENCOUNTER — Encounter: Payer: Self-pay | Admitting: Adult Health

## 2020-10-02 VITALS — BP 112/81 | HR 95 | Temp 98.9°F | Resp 16 | Wt 152.6 lb

## 2020-10-02 DIAGNOSIS — R590 Localized enlarged lymph nodes: Secondary | ICD-10-CM | POA: Diagnosis not present

## 2020-10-02 DIAGNOSIS — R300 Dysuria: Secondary | ICD-10-CM | POA: Diagnosis not present

## 2020-10-02 DIAGNOSIS — N3000 Acute cystitis without hematuria: Secondary | ICD-10-CM

## 2020-10-02 LAB — POCT URINALYSIS DIPSTICK
Bilirubin, UA: NEGATIVE
Blood, UA: NEGATIVE
Glucose, UA: NEGATIVE
Ketones, UA: NEGATIVE
Nitrite, UA: POSITIVE
Protein, UA: NEGATIVE
Spec Grav, UA: <=1.005 — AB (ref 1.010–1.025)
Urobilinogen, UA: 0.2 E.U./dL
pH, UA: 5 (ref 5.0–8.0)

## 2020-10-02 MED ORDER — SULFAMETHOXAZOLE-TRIMETHOPRIM 800-160 MG PO TABS: 1.0000 | ORAL_TABLET | Freq: Two times a day (BID) | ORAL | 0 refills | Status: DC

## 2020-10-02 NOTE — Progress Notes (Signed)
Prescribed Bactrim and sent for culture.

## 2020-10-02 NOTE — Patient Instructions (Signed)
Urinary Tract Infection, Adult A urinary tract infection (UTI) is an infection of any part of the urinary tract. The urinary tract includes:  The kidneys.  The ureters.  The bladder.  The urethra. These organs make, store, and get rid of pee (urine) in the body. What are the causes? This infection is caused by germs (bacteria) in your genital area. These germs grow and cause swelling (inflammation) of your urinary tract. What increases the risk? The following factors may make you more likely to develop this condition:  Using a small, thin tube (catheter) to drain pee.  Not being able to control when you pee or poop (incontinence).  Being female. If you are female, these things can increase the risk: ? Using these methods to prevent pregnancy:  A medicine that kills sperm (spermicide).  A device that blocks sperm (diaphragm). ? Having low levels of a female hormone (estrogen). ? Being pregnant. You are more likely to develop this condition if:  You have genes that add to your risk.  You are sexually active.  You take antibiotic medicines.  You have trouble peeing because of: ? A prostate that is bigger than normal, if you are female. ? A blockage in the part of your body that drains pee from the bladder. ? A kidney stone. ? A nerve condition that affects your bladder. ? Not getting enough to drink. ? Not peeing often enough.  You have other conditions, such as: ? Diabetes. ? A weak disease-fighting system (immune system). ? Sickle cell disease. ? Gout. ? Injury of the spine. What are the signs or symptoms? Symptoms of this condition include:  Needing to pee right away.  Peeing small amounts often.  Pain or burning when peeing.  Blood in the pee.  Pee that smells bad or not like normal.  Trouble peeing.  Pee that is cloudy.  Fluid coming from the vagina, if you are female.  Pain in the belly or lower back. Other symptoms include:  Vomiting.  Not  feeling hungry.  Feeling mixed up (confused). This may be the first symptom in older adults.  Being tired and grouchy (irritable).  A fever.  Watery poop (diarrhea). How is this treated?  Taking antibiotic medicine.  Taking other medicines.  Drinking enough water. In some cases, you may need to see a specialist. Follow these instructions at home: Medicines  Take over-the-counter and prescription medicines only as told by your doctor.  If you were prescribed an antibiotic medicine, take it as told by your doctor. Do not stop taking it even if you start to feel better. General instructions  Make sure you: ? Pee until your bladder is empty. ? Do not hold pee for a long time. ? Empty your bladder after sex. ? Wipe from front to back after peeing or pooping if you are a female. Use each tissue one time when you wipe.  Drink enough fluid to keep your pee pale yellow.  Keep all follow-up visits.   Contact a doctor if:  You do not get better after 1-2 days.  Your symptoms go away and then come back. Get help right away if:  You have very bad back pain.  You have very bad pain in your lower belly.  You have a fever.  You have chills.  You feeling like you will vomit or you vomit. Summary  A urinary tract infection (UTI) is an infection of any part of the urinary tract.  This condition is caused by   germs in your genital area.  There are many risk factors for a UTI.  Treatment includes antibiotic medicines.  Drink enough fluid to keep your pee pale yellow. This information is not intended to replace advice given to you by your health care provider. Make sure you discuss any questions you have with your health care provider. Document Revised: 03/06/2020 Document Reviewed: 03/06/2020 Elsevier Patient Education  2021 [employer] Lymphadenopathy  Lymphadenopathy means that your lymph glands are swollen or larger than normal. Lymph glands, also called lymph nodes,  are collections of tissue that filter excess fluid, bacteria, viruses, and waste from your bloodstream. They are part of your body's disease-fighting system (immune system), which protects your body from germs. There may be different causes of lymphadenopathy, depending on where it is in your body. Some types go away on their own. Lymphadenopathy can occur anywhere that you have lymph glands, including these areas:  Neck (cervical lymphadenopathy).  Chest (mediastinal lymphadenopathy).  Lungs (hilar lymphadenopathy).  Underarms (axillary lymphadenopathy).  Groin (inguinal lymphadenopathy). When your immune system responds to germs, infection-fighting cells and fluid build up in your lymph glands. This causes some swelling and enlargement. If the lymph nodes do not go back to normal size after you have an infection or disease, your health care provider may do tests. These tests help to monitor your condition and find the reason why the glands are still swollen and enlarged. Follow these instructions at home:  Get plenty of rest.  Your health care provider may recommend over-the-counter medicines for pain. Take over-the-counter and prescription medicines only as told by your health care provider.  If directed, apply heat to swollen lymph glands as often as told by your health care provider. Use the heat source that your health care provider recommends, such as a moist heat pack or a heating pad. ? Place a towel between your skin and the heat source. ? Leave the heat on for 20-30 minutes. ? Remove the heat if your skin turns bright red. This is especially important if you are unable to feel pain, heat, or cold. You may have a greater risk of getting burned.  Check your affected lymph glands every day for changes. Check other lymph gland areas as told by your health care provider. Check for changes such as: ? More swelling. ? Sudden increase in size. ? Redness or pain. ? Hardness.  Keep all  follow-up visits. This is important.   Contact a health care provider if you have:  Lymph glands that: ? Are still swollen after 2 weeks. ? Have suddenly gotten bigger or the swelling spreads. ? Are red, painful, or hard.  Fluid leaking from the skin near an enlarged lymph gland.  Problems with breathing.  A fever, chills, or night sweats.  Fatigue.  A sore throat.  Pain in your abdomen.  Weight loss. Get help right away if you have:  Severe pain.  Chest pain.  Shortness of breath. These symptoms may represent a serious problem that is an emergency. Do not wait to see if the symptoms will go away. Get medical help right away. Call your local emergency services (911 in the [place] Do not drive yourself to the hospital. Summary  Lymphadenopathy means that your lymph glands are swollen or larger than normal.  Lymph glands, also called lymph nodes, are collections of tissue that filter excess fluid, bacteria, viruses, and waste from the bloodstream. They are part of your body's disease-fighting system (immune system).  Lymphadenopathy can occur  anywhere that you have lymph glands.  If the lymph nodes do not go back to normal size after you have an infection or disease, your health care provider may do tests to monitor your condition and find the reason why the glands are still swollen and enlarged.  Check your affected lymph glands every day for changes. Check other lymph gland areas as told by your health care provider. This information is not intended to replace advice given to you by your health care provider. Make sure you discuss any questions you have with your health care provider. Document Revised: 05/20/2020 Document Reviewed: 05/20/2020 Elsevier Patient Education  2021 [employer].

## 2020-10-02 NOTE — Progress Notes (Addendum)
Established patient visit   Patient: Rebecca Davies   DOB: 02-19-1986   35 y.o. Female  MRN: [id number] Visit Date: 10/02/2020  Today's healthcare provider: Marcille Buffy, FNP   Chief Complaint  Patient presents with  . Follow-up  . Dysuria   Subjective    Dysuria  This is a new problem. The current episode started in the past 7 days. The problem has been unchanged. The quality of the pain is described as burning. The pain is at a severity of 4/10. There has been no fever. She is sexually active. Associated symptoms include urgency. Pertinent negatives include no chills, discharge, flank pain, frequency, hematuria, hesitancy, nausea, possible pregnancy, sweats or vomiting. Associated symptoms comments: Vaginal odor. Treatments tried: Azo and Cranberry pills. The treatment provided mild relief.   denies any STD concerns.  History of UTI has seen urology in the past. She reports no further work up was advised.  Denies any history of pyelonephritis.  Follow up for Swelling of lymph node- left posterior cervical   The patient was last seen for this 3 weeks ago. Changes made at last visit include starting patient on Augmentin 875-125 and ordering mononucleosis test. She reports excellent compliance with treatment. She feels that condition is Unchanged. She is not having side effects.    She has had no cyclic vomiting since last and sees gastrointestinal.   Patient  denies any fever, body aches,chills, rash, chest pain, shortness of breath, nausea, vomiting, or diarrhea.  Denies dizziness, lightheadedness, pre syncopal or syncopal episodes.   -----------------------------------------------------------------------------------------   Patient Active Problem List   Diagnosis Date Noted  . Non-recurrent acute serous otitis media of left ear 09/10/2020  . Need for influenza vaccination 09/10/2020  . Leukocytes in urine 09/10/2020  . Swelling of lymph node- left  posterior cervical.  09/10/2020  . Vitamin D insufficiency 09/10/2020  . Body mass index 27.0-27.9, adult 09/10/2020  . Dysuria 04/08/2020  . Weakness of both legs 04/08/2020  . Nausea and vomiting 04/08/2020  . Generalized abdominal pain 04/08/2020  . Renal cyst, left 04/08/2020  . Leukocytosis 04/08/2020  . Anal skin tag   . Grade III hemorrhoids   . Left hip pain 05/10/2017  . 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 Screening for blood or protein in urine 09/04/2014  . Anxiety and depression 09/04/2014   Past Medical History:  Diagnosis Date  . Anxiety   . Arthritis    FINGER, ELBOW  . BRCA negative 06/2013   MyRisk neg  . Chicken pox   . Depression   . Family history of breast cancer   . Family history of pancreatic cancer   . GERD (gastroesophageal reflux disease)    OCC- NO MEDS  . Migraine    H/O MIGRAINES   Past Surgical History:  Procedure Laterality Date  . HEMORRHOID SURGERY N/A 04/05/2019   Procedure: HEMORRHOIDECTOMY, EXCISION ANAL SKIN TAG;  Surgeon: Fredirick Maudlin, MD;  Location: [facility] ORS;  Service: General;  Laterality: N/A;  . WISDOM TOOTH EXTRACTION  2013?   Social History   Tobacco Use  . Smoking status: Never Smoker  . Smokeless tobacco: Never Used  Vaping Use  . Vaping Use: Never used  Substance Use Topics  . Alcohol use: Yes    Alcohol/week: 1.0 standard drink    Types: 1 Standard drinks or equivalent per week    Comment: 1 glass of wine every few weeks.   . Drug use: No   Social History  Socioeconomic History  . Marital status: Single    Spouse name: Not on file  . Number of children: Not on file  . Years of education: Not on file  . Highest education level: Not on file  Occupational History  . Not on file  Tobacco Use  . Smoking status: Never Smoker  . Smokeless tobacco: Never Used  Vaping Use  . Vaping Use: Never used  Substance and Sexual Activity  . Alcohol use: Yes    Alcohol/week: 1.0 standard drink    Types: 1 Standard  drinks or equivalent per week    Comment: 1 glass of wine every few weeks.   . Drug use: No  . Sexual activity: Yes    Partners: Male    Birth control/protection: Implant  Other Topics Concern  . Not on file  Social History Narrative   From [place] and residing in [place]   1 Son    1 cat in the house   Enjoys movies, going to eat, beauty products    Social Determinants of Health   Financial Resource Strain: Not on file  Food Insecurity: Not on file  Transportation Needs: Not on file  Physical Activity: Not on file  Stress: Not on file  Social Connections: Not on file  Intimate Partner Violence: Not on file   Family Status  Relation Name Status  . Mother  Alive  . Father  Alive  . MGM  Alive  . PGF  Deceased  . Brother  Alive  . MGF  Alive  . PGM  Alive  . Brother  Alive  . Paternal GGM  (Not Specified)  . Ethlyn Daniels  (Not Specified)  . Neg Hx  (Not Specified)   Family History  Problem Relation Age of Onset  . Hyperlipidemia Mother   . Mental illness Mother        anxiety and depression  . Uterine cancer Mother   . Alcohol abuse Father   . Hypertension Maternal Grandmother   . Heart disease Maternal Grandmother   . Stroke Maternal Grandmother   . Mental illness Maternal Grandmother   . Pancreatic cancer Maternal Grandmother 77       not genetic  . Alcohol abuse Paternal Grandfather   . Cancer Paternal Grandfather   . Breast cancer Paternal Grandmother   . Breast cancer Paternal Great-grandmother   . Breast cancer Paternal Aunt   . Stomach cancer Neg Hx   . Rectal cancer Neg Hx   . Colon cancer Neg Hx   . Esophageal cancer Neg Hx    Allergies  Allergen Reactions  . Betadine [Povidone Iodine] Rash  . Shellfish Allergy Swelling and Rash       Medications: Outpatient Medications Prior to Visit  Medication Sig  . ALPRAZolam (XANAX) 0.5 MG tablet Take 1 tablet (0.5 mg total) by mouth at bedtime as needed for anxiety.  . AMBULATORY NON FORMULARY  MEDICATION Medication Name: Nitroglycerine ointment 0.125 %  Apply a pea sized amount internally three times daily for 4 to 6 weeks Dispense 30 GM zero refill  . amitriptyline (ELAVIL) 150 MG tablet Take 1 tablet (150 mg total) by mouth at bedtime as needed.  Marland Kitchen Aprepitant 80 & 125 MG CAPS Take 1 capsule by mouth at bedtime.  . Vitamin D, Ergocalciferol, (DRISDOL) 1.25 MG (50000 UNIT) CAPS capsule Take 1 capsule (50,000 Units total) by mouth every 7 (seven) days. (taking one tablet per week) walk in lab in office 1-2 weeks after completing  prescription.  . [DISCONTINUED] amoxicillin-clavulanate (AUGMENTIN) 875-125 MG tablet Take 1 tablet by mouth 2 (two) times daily.  . [DISCONTINUED] aprepitant (EMEND) 125 MG capsule Take one capsule by mouth twice daily with onset of symptoms   Facility-Administered Medications Prior to Visit  Medication Dose Route Frequency Provider  . etonogestrel (NEXPLANON) implant 68 mg  68 mg Subdermal Once Dalia Heading, CNM    Review of Systems  Constitutional: Negative for chills.  Gastrointestinal: Negative for nausea and vomiting.  Genitourinary: Positive for dysuria and urgency. Negative for flank pain, frequency, hematuria and hesitancy.    Last CBC Lab Results  Component Value Date   WBC 8.1 09/11/2020   HGB 14.7 09/11/2020   HCT 42.0 09/11/2020   MCV 86 09/11/2020   MCH 30.0 09/11/2020   RDW 11.8 09/11/2020   PLT 212 16/08/930   Last metabolic panel Lab Results  Component Value Date   GLUCOSE 88 09/11/2020   NA 140 09/11/2020   K 4.2 09/11/2020   CL 101 09/11/2020   CO2 22 09/11/2020   BUN 14 09/11/2020   CREATININE 0.80 09/11/2020   GFRNONAA 96 09/11/2020   GFRAA 110 09/11/2020   CALCIUM 9.7 09/11/2020   PROT 7.2 09/11/2020   ALBUMIN 4.6 09/11/2020   LABGLOB 2.6 09/11/2020   AGRATIO 1.8 09/11/2020   BILITOT 0.9 09/11/2020   ALKPHOS 111 09/11/2020   AST 22 09/11/2020   ALT 39 (H) 09/11/2020   ANIONGAP 14 04/06/2020   Last  lipids Lab Results  Component Value Date   CHOL 175 09/11/2020   HDL 32 (L) 09/11/2020   LDLCALC 89 09/11/2020   LDLDIRECT 99.0 09/04/2014   TRIG 328 (H) 09/11/2020   CHOLHDL 5.5 (H) 09/11/2020   Last hemoglobin A1c No results found for: HGBA1C Last thyroid functions Lab Results  Component Value Date   TSH 1.150 09/11/2020   Last vitamin D Lab Results  Component Value Date   VD25OH 23.4 (L) 09/11/2020   Last vitamin B12 and Folate No results found for: VITAMINB12, FOLATE     Objective    BP 112/81   Pulse 95   Temp 98.9 F (37.2 C) (Oral)   Resp 16   Wt 152 lb 9.6 oz (69.2 kg)   SpO2 98%   BMI 28.83 kg/m  BP Readings from Last 3 Encounters:  10/02/20 112/81  09/10/20 121/80  04/30/20 118/90   Wt Readings from Last 3 Encounters:  10/02/20 152 lb 9.6 oz (69.2 kg)  09/10/20 147 lb 9.6 oz (67 kg)  04/30/20 140 lb 9.6 oz (63.8 kg)       Physical Exam Vitals and nursing note reviewed.  Constitutional:      General: She is not in acute distress.    Appearance: Normal appearance. She is not ill-appearing or toxic-appearing.  HENT:     Head: Normocephalic and atraumatic.     Jaw: There is normal jaw occlusion.     Right Ear: Hearing, tympanic membrane, ear canal and external ear normal.     Left Ear: Hearing, ear canal and external ear normal. A middle ear effusion is present. Tympanic membrane is not erythematous or bulging.     Nose: Nose normal.     Right Turbinates: Not enlarged, swollen or pale.     Left Turbinates: Not enlarged, swollen or pale.     Right Sinus: No maxillary sinus tenderness or frontal sinus tenderness.     Left Sinus: No maxillary sinus tenderness or frontal sinus tenderness.  Mouth/Throat:     Mouth: Mucous membranes are moist.     Pharynx: Oropharynx is clear. No oropharyngeal exudate or posterior oropharyngeal erythema.  Eyes:     General: No scleral icterus.       Right eye: No discharge.        Left eye: No discharge.      Conjunctiva/sclera: Conjunctivae normal.     Pupils: Pupils are equal, round, and reactive to light.  Neck:     Thyroid: No thyroid mass.     Trachea: Trachea and phonation normal.   Cardiovascular:     Rate and Rhythm: Normal rate and regular rhythm.     Pulses: Normal pulses.     Heart sounds: Normal heart sounds. No murmur heard. No friction rub. No gallop.   Pulmonary:     Effort: Pulmonary effort is normal. No respiratory distress.     Breath sounds: Normal breath sounds. No stridor. No wheezing, rhonchi or rales.  Chest:     Chest wall: No tenderness.  Abdominal:     General: Bowel sounds are normal. There is no distension.     Palpations: Abdomen is soft. There is no mass.     Tenderness: There is no abdominal tenderness. There is no right CVA tenderness or guarding.  Genitourinary:    Comments: Deferred.  Musculoskeletal:        General: No tenderness. Normal range of motion.     Cervical back: Full passive range of motion without pain, normal range of motion and neck supple. No edema. No pain with movement.     Right lower leg: No edema.     Left lower leg: No edema.  Lymphadenopathy:     Cervical: Cervical adenopathy present.     Right cervical: No superficial, deep or posterior cervical adenopathy.    Left cervical: No superficial or deep cervical adenopathy.  Skin:    General: Skin is warm.     Findings: No erythema, lesion or rash.  Neurological:     Mental Status: She is alert and oriented to person, place, and time.     Motor: No weakness.     Gait: Gait normal.  Psychiatric:        Mood and Affect: Mood normal.        Behavior: Behavior normal.        Thought Content: Thought content normal.        Judgment: Judgment normal.       Results for orders placed or performed in visit on 10/02/20  POCT urinalysis dipstick  Result Value Ref Range   Color, UA dark yellow    Clarity, UA clear    Glucose, UA Negative Negative   Bilirubin, UA negative     Ketones, UA negative    Spec Grav, UA <=1.005 (A) 1.010 - 1.025   Blood, UA negative    pH, UA 5.0 5.0 - 8.0   Protein, UA Negative Negative   Urobilinogen, UA 0.2 0.2 or 1.0 E.U./dL   Nitrite, UA positive    Leukocytes, UA Moderate (2+) (A) Negative   Appearance     Odor      Assessment & Plan      1. Dysuria - POCT urinalysis dipstick - Urine Culture - sulfamethoxazole-trimethoprim (BACTRIM DS) 800-160 MG tablet; Take 1 tablet by mouth 2 (two) times daily.  Dispense: 14 tablet; Refill: 0  2. Acute cystitis without hematuria  3. Cervical lymphadenopathy- left  - Mononucleosis Test, Qual W/ Reflex -  CBC with Differential/Platelet - US Soft Tissue Head/Neck (NON-THYROID) Red Flags discussed. The patient was given clear instructions to go to ER or return to medical center if any red flags develop, symptoms do not improve, worsen or new problems develop. They verbalized understanding.   Will order soft tissue ultrasound to evaluate node. She would like to hold off on ENT referral until after ultrasound.  Return in about 3 months (around 12/30/2020), or if symptoms worsen or fail to improve, for at any time for any worsening symptoms, Go to Emergency room/ urgent care if worse.     The entirety of the information documented in the History of Present Illness, Review of Systems and Physical Exam were personally obtained by me. Portions of this information were initially documented by the CMA and reviewed by me for thoroughness and accuracy.      Marcille Buffy, [place] [phone] (phone) [phone] (fax)  [place]

## 2020-10-03 LAB — CBC WITH DIFFERENTIAL/PLATELET
Basophils Absolute: 0 10*3/uL (ref 0.0–0.2)
Basos: 0 %
EOS (ABSOLUTE): 0.1 10*3/uL (ref 0.0–0.4)
Eos: 1 %
Hematocrit: 42.4 % (ref 34.0–46.6)
Hemoglobin: 14.5 g/dL (ref 11.1–15.9)
Immature Grans (Abs): 0 10*3/uL (ref 0.0–0.1)
Immature Granulocytes: 0 %
Lymphocytes Absolute: 3.9 10*3/uL — ABNORMAL HIGH (ref 0.7–3.1)
Lymphs: 39 %
MCH: 29 pg (ref 26.6–33.0)
MCHC: 34.2 g/dL (ref 31.5–35.7)
MCV: 85 fL (ref 79–97)
Monocytes Absolute: 0.7 10*3/uL (ref 0.1–0.9)
Monocytes: 7 %
Neutrophils Absolute: 5.3 10*3/uL (ref 1.4–7.0)
Neutrophils: 53 %
Platelets: 240 10*3/uL (ref 150–450)
RBC: 5 x10E6/uL (ref 3.77–5.28)
RDW: 12.1 % (ref 11.7–15.4)
WBC: 10.1 10*3/uL (ref 3.4–10.8)

## 2020-10-03 LAB — MONO QUAL W/RFLX QN: Mono Qual W/Rflx Qn: NEGATIVE

## 2020-10-04 LAB — URINE CULTURE

## 2020-10-06 NOTE — Progress Notes (Signed)
Cbc ok, lymphocytes mild elevation, she is currently on Bactrim for urinary symptoms, advise to complete medication if symptoms improving on antibiotic, mixed urogenital flora less than 25,000 colonies on urine culture usually insignificant but if she is improving complete antibiotic.  Recheck CBC please add lab for one month - walk in.

## 2020-10-07 ENCOUNTER — Telehealth: Payer: Self-pay

## 2020-10-07 DIAGNOSIS — D72820 Lymphocytosis (symptomatic): Secondary | ICD-10-CM

## 2020-10-07 NOTE — Telephone Encounter (Signed)
-----   Message from Berniece Pap, FNP sent at 10/06/2020  9:08 AM EST ----- Cbc ok, lymphocytes mild elevation, she is currently on Bactrim for urinary symptoms, advise to complete medication if symptoms improving on antibiotic, mixed urogenital flora less than 25,000 colonies on urine culture usually insignificant but if she is improving complete antibiotic.  Recheck CBC please add lab for one month - walk in.

## 2020-10-09 ENCOUNTER — Ambulatory Visit: Payer: Self-pay | Admitting: Adult Health

## 2020-10-15 ENCOUNTER — Ambulatory Visit: Payer: Managed Care, Other (non HMO)

## 2020-10-16 ENCOUNTER — Ambulatory Visit (HOSPITAL_COMMUNITY)
Admission: RE | Admit: 2020-10-16 | Discharge: 2020-10-16 | Disposition: A | Discharge status: Home / Self Care | Payer: Managed Care, Other (non HMO) | Source: Ambulatory Visit | Attending: Adult Health | Admitting: Adult Health

## 2020-10-16 ENCOUNTER — Other Ambulatory Visit: Payer: Self-pay

## 2020-10-16 DIAGNOSIS — R590 Localized enlarged lymph nodes: Secondary | ICD-10-CM | POA: Diagnosis present

## 2020-10-16 NOTE — Progress Notes (Signed)
Lymph node swelling measuring 1.2 x 0.4 x 0.7 cm seen, radiologist feels this is normal in appearance, however clinical surveillance and rechecking by imaging if it starts to enlarge is advised.

## 2020-12-16 IMAGING — MR MRI ABDOMEN WITH AND WITHOUT CONTRAST
20 series · 48 of 48 positions shown · IV contrast (gadavist)
Comparison: Abdominal ultrasound of 03/11/2019.

CLINICAL DATA: Left renal lesion on ultrasound.

EXAM:
MRI ABDOMEN WITHOUT AND WITH CONTRAST
TECHNIQUE: Multiplanar multisequence MR imaging of the abdomen was performed
both before and after the administration of intravenous contrast.
CONTRAST:  60 cc Gadavist

[Series 2: cor haste · coronal · 6.0mm · 1.19mm/px · 2 of 32 slices shown]
[im 1/32]
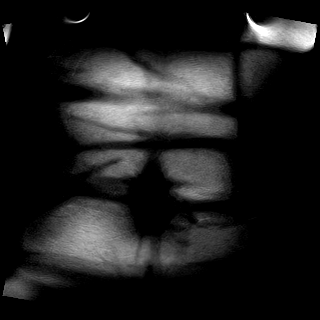
[im 32/32]
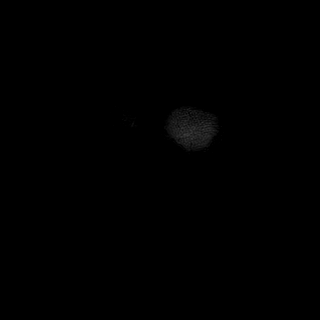

[Series 4: T2 fat-sat · axial · 6.0mm · 1.19mm/px · z∈[-49,+174]mm · 2 of 32 slices shown]
[im 1/32]
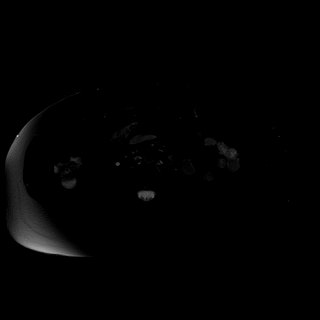
[im 32/32]
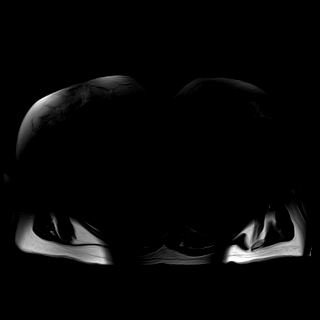

[Series 6: DWI · axial · 6.0mm · 1.42mm/px · z∈[-49,+174]mm · 2 of 32 slices shown (1 of 4)]
[im 1/32]
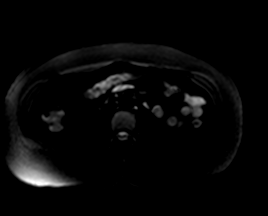
[im 32/32]
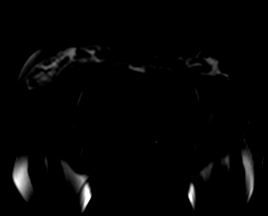

[Series 6: DWI · axial · 6.0mm · 1.42mm/px · z∈[-49,+174]mm · 2 of 32 slices shown (2 of 4)]
[im 1/32]
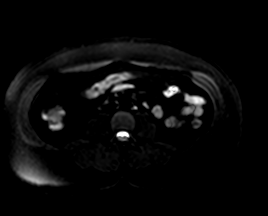
[im 32/32]
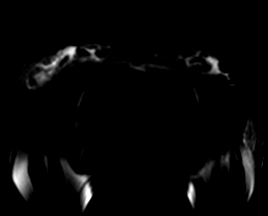

[Series 6: DWI · axial · 6.0mm · 1.42mm/px · 1 of 32 slices shown (3 of 4)]
[im 1/32]
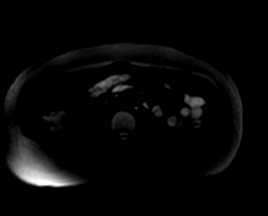

[Series 7: DWI · axial · 6.0mm · 1.42mm/px · 1 of 32 slices shown (4 of 4)]
[im 1/32]
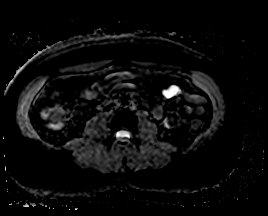

[Series 8: ax in & · axial · 3.0mm · 1.19mm/px · z∈[-52,+161]mm · 3 of 72 slices shown (1 of 2)]
[im 1/72]
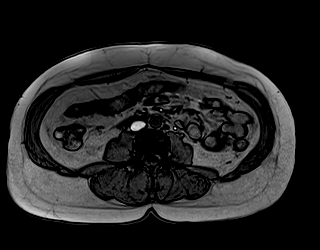
[im 36/72]
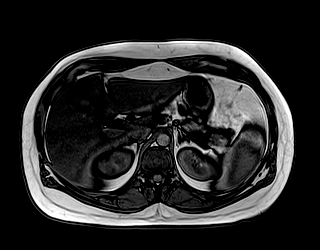
[im 72/72]
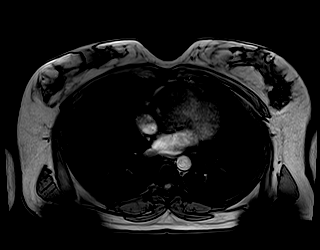

[Series 8: ax in & · axial · 3.0mm · 1.19mm/px · z∈[-52,+161]mm · 3 of 72 slices shown (2 of 2)]
[im 1/72]
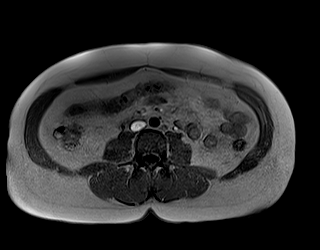
[im 36/72]
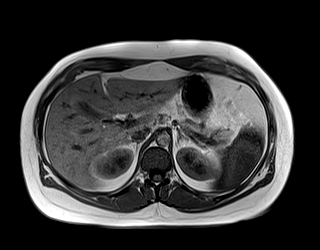
[im 72/72]
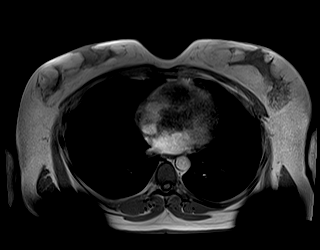

[Series 9: bSSFP · axial · 6.0mm · 0.74mm/px · 1 of 32 slices shown]
[im 1/32]
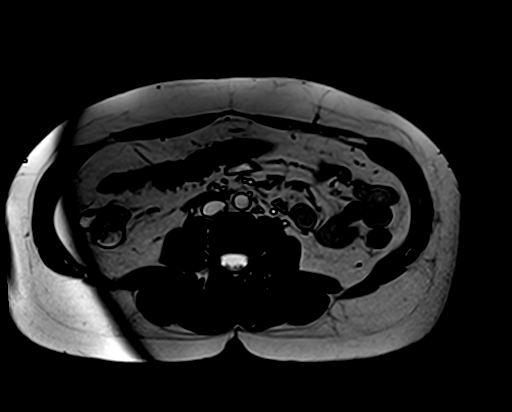

[Series 10: T2 · axial · 6.0mm · 1.19mm/px · 1 of 32 slices shown]
[im 1/32]
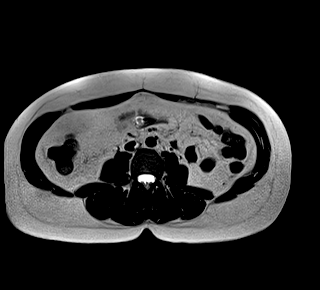

[Series 11: T1 dynamic · axial · non-contrast · 3.0mm · 1.19mm/px · z∈[-44,+169]mm · 3 of 72 slices shown (1 of 9)]
[im 1/72]
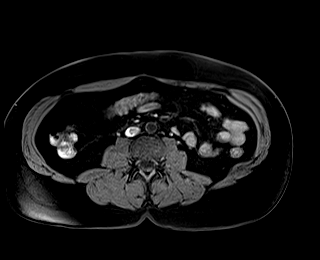
[im 36/72]
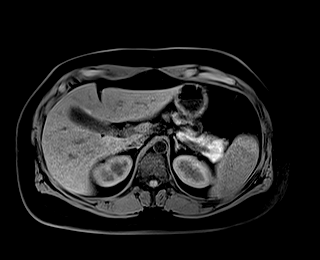
[im 72/72]
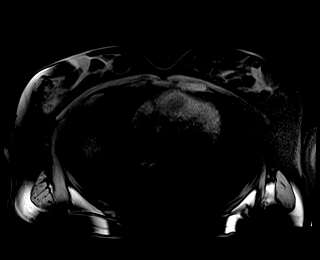

[Series 12: T1 dynamic · axial · 3.0mm · 1.19mm/px · z∈[-44,+169]mm · 3 of 72 slices shown (2 of 9)]
[im 1/72]
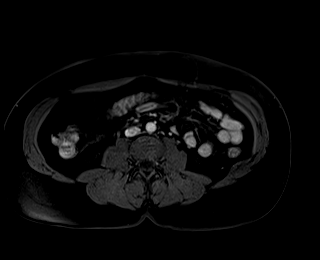
[im 36/72]
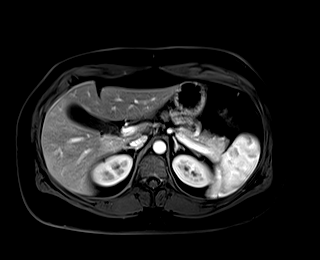
[im 72/72]
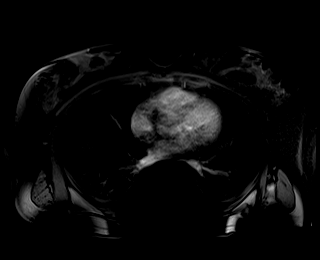

[Series 13: T1 dynamic · axial · 3.0mm · 1.19mm/px · z∈[-44,+169]mm · 3 of 72 slices shown (3 of 9)]
[im 1/72]
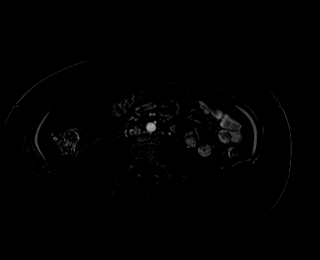
[im 36/72]
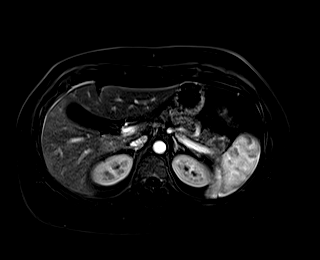
[im 72/72]
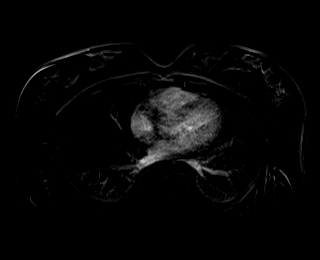

[Series 14: T1 dynamic · axial · 3.0mm · 1.19mm/px · z∈[-44,+169]mm · 3 of 72 slices shown (4 of 9)]
[im 1/72]
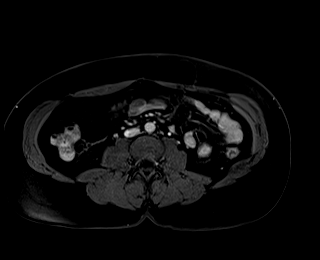
[im 36/72]
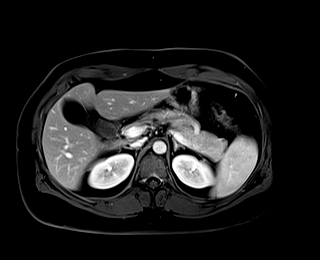
[im 72/72]
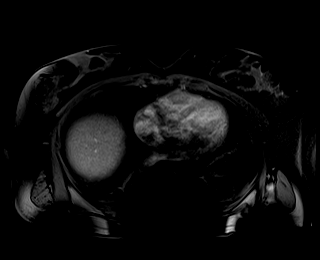

[Series 15: T1 dynamic · axial · 3.0mm · 1.19mm/px · z∈[-44,+169]mm · 3 of 72 slices shown (5 of 9)]
[im 1/72]
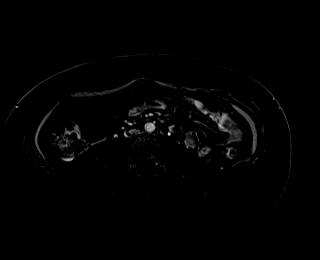
[im 36/72]
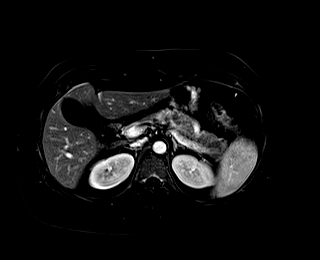
[im 72/72]
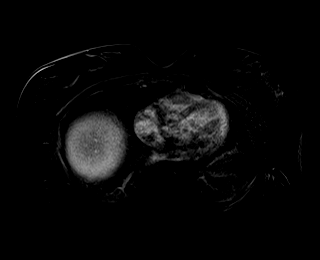

[Series 16: T1 dynamic · axial · 3.0mm · 1.19mm/px · z∈[-44,+169]mm · 3 of 72 slices shown (6 of 9)]
[im 1/72]
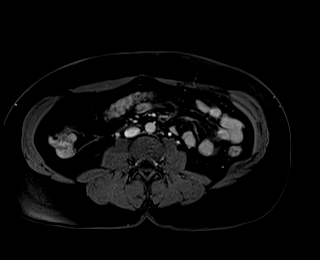
[im 36/72]
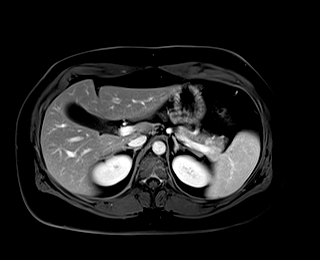
[im 72/72]
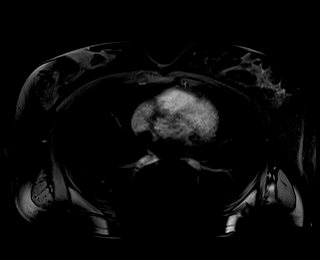

[Series 17: T1 dynamic · axial · 3.0mm · 1.19mm/px · z∈[-44,+169]mm · 3 of 72 slices shown (7 of 9)]
[im 1/72]
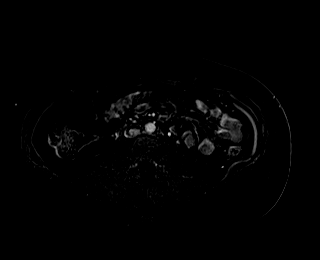
[im 36/72]
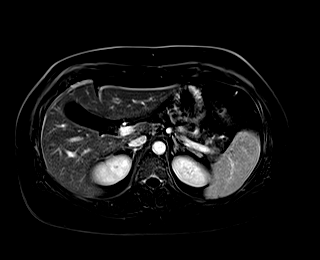
[im 72/72]
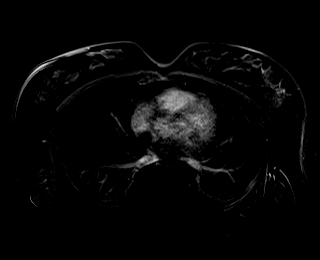

[Series 18: T1 dynamic post-contrast · coronal · 3.0mm · 1.31mm/px · 3 of 80 slices shown]
[im 1/80]
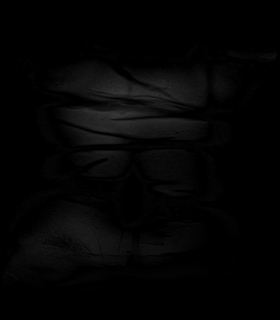
[im 40/80]
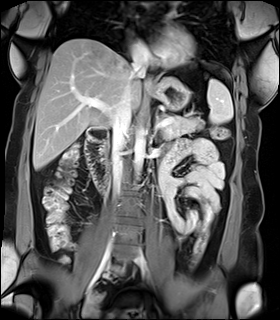
[im 80/80]
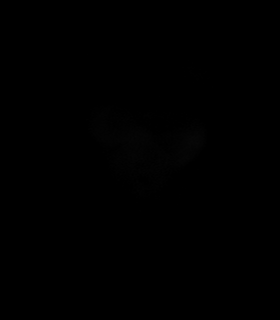

[Series 19: T1 dynamic · axial · 3.0mm · 1.19mm/px · z∈[-44,+169]mm · 3 of 72 slices shown (8 of 9)]
[im 1/72]
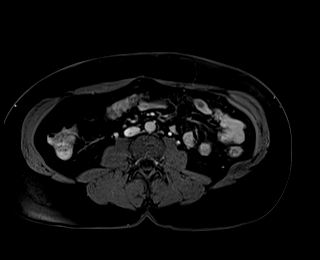
[im 36/72]
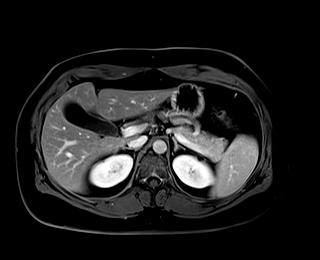
[im 72/72]
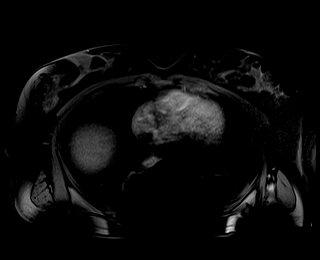

[Series 20: T1 dynamic · axial · 3.0mm · 1.19mm/px · z∈[-44,+169]mm · 3 of 72 slices shown (9 of 9)]
[im 1/72]
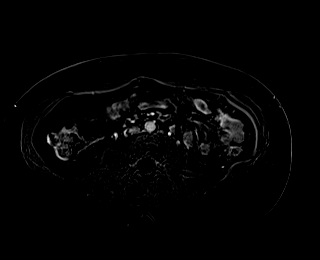
[im 36/72]
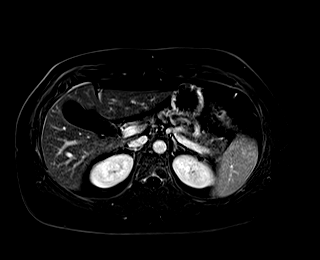
[im 72/72]
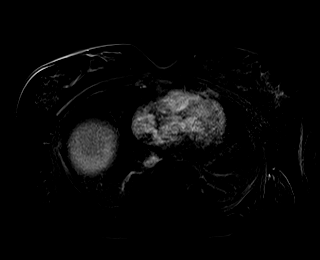

[48 of 48 positions shown; findings below may reference images not displayed]

FINDINGS: Lower chest: Normal heart size without pericardial or pleural
effusion.

Hepatobiliary: Moderate hepatic steatosis. Normal gallbladder,
without biliary ductal dilatation.

Pancreas: Suspicion of pancreas divisum, with a diminutive dorsal
duct entering the duodenum on [DATE]. No duct dilatation or acute
inflammation.

Spleen:  Normal in size, without focal abnormality.

Adrenals/Urinary Tract: Normal adrenal glands. Normal right kidney.
A posterior interpolar left renal lesion measures 1.8 cm on image
[DATE]. Demonstrates precontrast heterogeneous T1 signal, including on
image 48/11. After contrast, at least 1 thin enhancing septation is
identified within. Example image 44/15 and image 46/19.

Stomach/Bowel: Normal stomach and abdominal bowel loops.

Vascular/Lymphatic: Normal caliber of the aorta and branch vessels.
No abdominal adenopathy.

Other:  No ascites.

Musculoskeletal: No acute osseous abnormality.
IMPRESSION: 1. Interpolar left renal lesion is consistent with a Bosniak type 2F
cyst. Recommend surveillance with pre and post contrast abdominal
MRI. Consider initial follow-up at 6 months.
2.  No acute abdominal process.
3. Hepatic steatosis.
4. Possible pancreas divisum or variant.

## 2021-04-30 ENCOUNTER — Encounter: Payer: Self-pay | Admitting: General Surgery

## 2021-12-08 ENCOUNTER — Telehealth: Payer: Managed Care, Other (non HMO) | Admitting: Physician Assistant

## 2021-12-08 DIAGNOSIS — Z91013 Allergy to seafood: Secondary | ICD-10-CM

## 2021-12-08 MED ORDER — EPINEPHRINE 0.3 MG/0.3ML IJ SOAJ: 0.3000 mg | INTRAMUSCULAR | 1 refills | Status: DC | PRN

## 2021-12-08 NOTE — Progress Notes (Signed)
?Virtual Visit Consent  ? ?[name], you are scheduled for a virtual visit with a [facility] provider today. Just as with appointments in the office, your consent must be obtained to participate. Your consent will be active for this visit and any virtual visit you may have with one of our providers in the next 365 days. If you have a MyChart account, a copy of this consent can be sent to you electronically. ? ?As this is a virtual visit, video technology does not allow for your provider to perform a traditional examination. This may limit your provider's ability to fully assess your condition. If your provider identifies any concerns that need to be evaluated in person or the need to arrange testing (such as labs, EKG, etc.), we will make arrangements to do so. Although advances in technology are sophisticated, we cannot ensure that it will always work on either your end or our end. If the connection with a video visit is poor, the visit may have to be switched to a telephone visit. With either a video or telephone visit, we are not always able to ensure that we have a secure connection. ? ?By engaging in this virtual visit, you consent to the provision of healthcare and authorize for your insurance to be billed (if applicable) for the services provided during this visit. Depending on your insurance coverage, you may receive a charge related to this service. ? ?I need to obtain your verbal consent now. Are you willing to proceed with your visit today? [name] has provided verbal consent on 12/08/2021 for a virtual visit (video or telephone). Leeanne Rio, PA-C ? ?Date: 12/08/2021 3:51 PM ? ?Virtual Visit via Video Note  ? ?I, Leeanne Rio, connected with  Rebecca Davies  ([id number], 01-19-1986) on 12/08/21 at  3:45 PM EDT by a video-enabled telemedicine application and verified that I am speaking with the correct person using two identifiers. ? ?Location: ?Patient: Virtual Visit Location  Patient: Home ?Provider: Virtual Visit Location Provider: Home Office ?  ?I discussed the limitations of evaluation and management by telemedicine and the availability of in person appointments. The patient expressed understanding and agreed to proceed.   ? ?History of Present Illness: ?Rebecca Davies is a 36 y.o. who identifies as a female who was assigned female at birth, and is being seen today for request for EPI pen to have on hand at home. 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 Her last major reaction was around age 58 per her report. Was facial swelling and rash without shortness of breath. Notes she has not had a major reaction since then up until the past weekend. Had [place] food and discovered shrimp in her dish. Overnight started with rash of her face and neck with itching. Some mild facial swelling without any tongue swelling, racing heart or shortness of breath. Started Benadryl every 4 hours with resolution of rash in the next day and full resolution of itching and swelling as of today. Is currently without a PCP but would like to have a new script for EpiPens to keep on hand. ? ? ?HPI: HPI  ?Problems:  ?Patient Active Problem List  ? Diagnosis Date Noted  ? Non-recurrent acute serous otitis media of left ear 09/10/2020  ? Need for influenza vaccination 09/10/2020  ? Leukocytes in urine 09/10/2020  ? Swelling of lymph node- left posterior cervical.  09/10/2020  ? Vitamin D insufficiency 09/10/2020  ? Body mass index  27.0-27.9, adult 09/10/2020  ? Dysuria 04/08/2020  ? Weakness of both legs 04/08/2020  ? Nausea and vomiting 04/08/2020  ? Generalized abdominal pain 04/08/2020  ? Renal cyst, left 04/08/2020  ? Leukocytosis 04/08/2020  ? Anal skin tag   ? Grade III hemorrhoids   ? Left hip pain 05/10/2017  ? Shellfish allergy 11/07/2016  ? Screening for blood or protein in urine 09/04/2014  ? Anxiety and depression 09/04/2014  ?  ?Allergies:  ?Allergies  ?Allergen Reactions  ?  Betadine [Povidone Iodine] Rash  ? Shellfish Allergy Swelling and Rash  ? ?Medications:  ?Current Outpatient Medications:  ?  EPINEPHrine (EPIPEN 2-PAK) 0.3 mg/0.3 mL IJ SOAJ injection, Inject 0.3 mg into the muscle as needed for anaphylaxis., Disp: 1 each, Rfl: 1 ?  ALPRAZolam (XANAX) 0.5 MG tablet, Take 1 tablet (0.5 mg total) by mouth at bedtime as needed for anxiety., Disp: 30 tablet, Rfl: 1 ? ?Current Facility-Administered Medications:  ?  etonogestrel (NEXPLANON) implant 68 mg, 68 mg, Subdermal, Once, Dalia Heading, CNM ? ?Observations/Objective: ?Patient is well-developed, well-nourished in no acute distress.  ?Resting comfortably at home.  ?Head is normocephalic, atraumatic.  ?No labored breathing. ?Speech is clear and coherent with logical content.  ?Patient is alert and oriented at baseline.  ?No rash or facial/neck swelling noted.  ? ?Assessment and Plan: ?1. Shellfish allergy ?- EPINEPHrine (EPIPEN 2-PAK) 0.3 mg/0.3 mL IJ SOAJ injection; Inject 0.3 mg into the muscle as needed for anaphylaxis.  Dispense: 1 each; Refill: 1 ? ?Recent reaction resolved with use of Benadryl thankfully. Discussed proper measures to take with any subsequent reactions. EpiPen sent to pharmacy with instructions for use.  ? ?Follow Up Instructions: ?I discussed the assessment and treatment plan with the patient. The patient was provided an opportunity to ask questions and all were answered. The patient agreed with the plan and demonstrated an understanding of the instructions.  A copy of instructions were sent to the patient via MyChart unless otherwise noted below.  ? ?The patient was advised to call back or seek an in-person evaluation if the symptoms worsen or if the condition fails to improve as anticipated. ? ?Time:  ?I spent 10 minutes with the patient via telehealth technology discussing the above problems/concerns.   ? ?Leeanne Rio, PA-C ?

## 2021-12-08 NOTE — Patient Instructions (Signed)
?[name], thank you for joining Leeanne Rio, PA-C for today's virtual visit.  While this provider is not your primary care provider (PCP), if your PCP is located in our provider database this encounter information will be shared with them immediately following your visit. ? ?Consent: ?(Patient) JOLA COFFIE provided verbal consent for this virtual visit at the beginning of the encounter. ? ?Current Medications: ? ?Current Outpatient Medications:  ?  ALPRAZolam (XANAX) 0.5 MG tablet, Take 1 tablet (0.5 mg total) by mouth at bedtime as needed for anxiety., Disp: 30 tablet, Rfl: 1 ?  AMBULATORY NON FORMULARY MEDICATION, Medication Name: Nitroglycerine ointment 0.125 %  Apply a pea sized amount internally three times daily for 4 to 6 weeks Dispense 30 GM zero refill, Disp: 30 g, Rfl: 1 ?  amitriptyline (ELAVIL) 150 MG tablet, Take 1 tablet (150 mg total) by mouth at bedtime as needed., Disp: 30 tablet, Rfl: 0 ?  Aprepitant 80 & 125 MG CAPS, Take 1 capsule by mouth at bedtime., Disp: , Rfl:  ?  sulfamethoxazole-trimethoprim (BACTRIM DS) 800-160 MG tablet, Take 1 tablet by mouth 2 (two) times daily., Disp: 14 tablet, Rfl: 0 ?  Vitamin D, Ergocalciferol, (DRISDOL) 1.25 MG (50000 UNIT) CAPS capsule, Take 1 capsule (50,000 Units total) by mouth every 7 (seven) days. (taking one tablet per week) walk in lab in office 1-2 weeks after completing prescription., Disp: 12 capsule, Rfl: 0 ? ?Current Facility-Administered Medications:  ?  etonogestrel (NEXPLANON) implant 68 mg, 68 mg, Subdermal, Once, Dalia Heading, CNM  ? ?Medications ordered in this encounter:  ?No orders of the defined types were placed in this encounter. ?  ? ?*If you need refills on other medications prior to your next appointment, please contact your pharmacy* ? ?Follow-Up: ?Call back or seek an in-person evaluation if the symptoms worsen or if the condition fails to improve as anticipated. ? ?Other Instructions ?Please keep the EpiPen  around in case of severe allergic reaction. ? ?Epinephrine Auto-Injector ?What is this medication? ?EPINEPHRINE (ep i NEF rin) treats severe allergic reactions (anaphylaxis). It may also be used to treat sudden asthma attacks. It reduces the effects of an allergic reaction, such as trouble breathing or swelling of the face, lips, and throat. Call emergency services after injection. You may need additional treatment. ?This medicine may be used for other purposes; ask your health care provider or pharmacist if you have questions. ?COMMON BRAND NAME(S): Adrenaclick, Auvi-Q, Epinephrine Professional EMS, Epinephrine Professional with Safety Seal, epinephrinesnap, epinephrinesnap-v, EpiPen, Epipen Jr, EPIsnap Epinephrine, SYMJEPI, Twinject ?What should I tell my care team before I take this medication? ?They need to know if you have any of the following conditions: ?Diabetes (high blood sugar) ?Glaucoma ?Heart disease ?High blood pressure ?Kidney disease ?Parkinson disease ?Pheochromocytoma ?Thyroid disease ?An unusual or allergic reaction to epinephrine, other medications, foods, dyes, or preservatives ?Pregnant or trying to get pregnant ?Breast-feeding ?How should I use this medication? ?This medication is injected into a muscle or under the skin. You will be taught how to prepare and give it. Take it as directed on the prescription label. Do not use it more often than directed. It is important that you put your used needles and syringes in a special sharps container. Do not put them in a trash can. If you do not have a sharps container, call your pharmacist or care team to get one. ?This medication comes with INSTRUCTIONS FOR USE. Ask your pharmacist for directions on how to use this  medication. Read the information carefully. Talk to your pharmacist or care team if you have questions. ?Talk to your care team about the use of this medication in children. While it may be prescribed for selected conditions, precautions  do apply. ?Overdosage: If you think you have taken too much of this medicine contact a poison control center or emergency room at once. ?NOTE: This medicine is only for you. Do not share this medicine with others. ?What if I miss a dose? ?This does not apply. This medication is not for regular use. It should only be used as needed. ?What may interact with this medication? ?Do not take this medication with any of the following: ?General anesthetics like desflurane, isoflurane, sevoflurane ?This medication may also interact with the following: ?Antihistamines for allergy, cough, and cold ?Certain medications for blood pressure, heart disease, irregular heart beat ?Certain medications for depression, anxiety, or psychotic disorders ?Certain medications for Parkinson's disease, like entacapone ?Digoxin ?Diuretics ?Doxapram ?Ergot alkaloids like dihydroergotamine, ergonovine, ergotamine, methylergonovine ?Levothyroxine ?MAOIs like Marplan, Nardil, and Parnate ?Oxytocin ?Phenothiazines like chlorpromazine, prochlorperazine, thioridazine ?Steroid medications like prednisone or cortisone ?Theophylline ?This list may not describe all possible interactions. Give your health care provider a list of all the medicines, herbs, non-prescription drugs, or dietary supplements you use. Also tell them if you smoke, drink alcohol, or use illegal drugs. Some items may interact with your medicine. ?What should I watch for while using this medication? ?Visit your care team for regular checks on your progress. Tell your care team if your symptoms do not start to get better or if they get worse. ?Call emergency services if you have trouble breathing. ?What side effects may I notice from receiving this medication? ?Side effects that you should report to your care team as soon as possible: ?Allergic reactions--skin rash, itching, hives, swelling of the face, lips, tongue, or throat ?Heart attack--pain or tightness in the chest, shoulders,  arms, or jaw, nausea, shortness of breath, cold or clammy skin, feeling faint or lightheaded ?Heart rhythm changes--fast or irregular heartbeat, dizziness, feeling faint or lightheaded, chest pain, trouble breathing ?Kidney injury--decrease in the amount of urine, swelling of the ankles, hands, or feet ?Pain, redness, or irritation at injection site ?Side effects that usually do not require medical attention (report to your care team if they continue or are bothersome): ?Anxiety, nervousness ?Dizziness ?Headache ?Heart palpitations--rapid, pounding, or irregular heartbeat ?Muscle weakness ?Nausea ?Pale skin, loss of color in lining of the eyelids, inner mouth, or nails ?Sweating ?Tremors or shaking ?Vomiting ?This list may not describe all possible side effects. Call your doctor for medical advice about side effects. You may report side effects to FDA at 1-800-FDA-1088. ?Where should I keep my medication? ?Keep out of the reach of children and pets. ?Store at room temperature between 20 and 25 degrees C (68 and 77 degrees F). Protect from light. Get rid of any unused medication after the expiration date. ?To get rid of medications that are no longer needed or have expired: ?Take the medication to a medication take-back program. Check with your pharmacy or law enforcement to find a location. ?If you cannot return the medication, ask your pharmacist or care team how to get rid of this medication safely. ?NOTE: This sheet is a summary. It may not cover all possible information. If you have questions about this medicine, talk to your doctor, pharmacist, or health care provider. ?? 2023 Elsevier/Gold Standard (2020-10-06 00:00:00) ? ? ? ?If you have been instructed to have an  in-person evaluation today at a local Urgent Care facility, please use the link below. It will take you to a list of all of our available [facility], including address, phone number and hours of operation. Please do not delay care.   ?[facility] ? ?If you or a family member do not have a primary care provider, use the link below to schedule a visit and establish care. When you choose a [facility] primary care physician or

## 2022-03-21 ENCOUNTER — Telehealth: Payer: Managed Care, Other (non HMO) | Admitting: Family Medicine

## 2022-03-21 DIAGNOSIS — L509 Urticaria, unspecified: Secondary | ICD-10-CM | POA: Diagnosis not present

## 2022-03-21 MED ORDER — PREDNISONE 20 MG PO TABS: 40.0000 mg | ORAL_TABLET | Freq: Every day | ORAL | 0 refills | Status: AC

## 2022-03-21 NOTE — Patient Instructions (Signed)
Rebecca Davies, thank you for joining Freddy Finner, NP for today's virtual visit.  While this provider is not your primary care provider (PCP), if your PCP is located in our provider database this encounter information will be shared with them immediately following your visit.  Consent: (Patient) Rebecca Davies provided verbal consent for this virtual visit at the beginning of the encounter.  Current Medications:  Current Outpatient Medications:    predniSONE (DELTASONE) 20 MG tablet, Take 2 tablets (40 mg total) by mouth daily with breakfast for 3 days., Disp: 6 tablet, Rfl: 0   ALPRAZolam (XANAX) 0.5 MG tablet, Take 1 tablet (0.5 mg total) by mouth at bedtime as needed for anxiety., Disp: 30 tablet, Rfl: 1   EPINEPHrine (EPIPEN 2-PAK) 0.3 mg/0.3 mL IJ SOAJ injection, Inject 0.3 mg into the muscle as needed for anaphylaxis., Disp: 1 each, Rfl: 1  Current Facility-Administered Medications:    etonogestrel (NEXPLANON) implant 68 mg, 68 mg, Subdermal, Once, Farrel Conners, CNM   Medications ordered in this encounter:  Meds ordered this encounter  Medications   predniSONE (DELTASONE) 20 MG tablet    Sig: Take 2 tablets (40 mg total) by mouth daily with breakfast for 3 days.    Dispense:  6 tablet    Refill:  0    Order Specific Question:   Supervising Provider    Answer:   Hyacinth Meeker, BRIAN [[id number]]     *If you need refills on other medications prior to your next appointment, please contact your pharmacy*  Follow-Up: Call back or seek an in-person evaluation if the symptoms worsen or if the condition fails to improve as anticipated.  Other Instructions Hives Hives (urticaria) are itchy, red, swollen areas on the skin. Hives can appear on any part of the body. Hives often fade within 24 hours (acute hives). Sometimes, new hives appear after old ones fade and the cycle can continue for several days or weeks (chronic hives). Hives do not spread from person to person (are not  contagious). Hives come from the body's reaction to something a person is allergic to (allergen), something that causes irritation, or various other triggers. When a person is exposed to a trigger, his or her body releases a chemical (histamine) that causes redness, itching, and swelling. Hives can appear right after exposure to a trigger or hours later. What are the causes? This condition may be caused by: Allergies to foods or ingredients. Insect bites or stings. Exposure to pollen or pets. Spending time in sunlight, heat, or cold (exposure). Exercise. Stress. You can also get hives from other medical conditions and treatments, such as: Viruses, including the common cold. Bacterial infections, such as urinary tract infections and strep throat. Certain medicines. Contact with latex or chemicals. Allergy shots. Blood transfusions. Sometimes, the cause of this condition is not known (idiopathic hives). What increases the risk? You are more likely to develop this condition if you: Are a woman. Have food allergies, especially to citrus fruits, milk, eggs, peanuts, tree nuts, or shellfish. Are allergic to: Medicines. Latex. Insects. Animals. Pollen. What are the signs or symptoms? Common symptoms of this condition include raised, itchy, red or white bumps or patches on your skin. These areas may: Become large and swollen (welts). Change in shape and location, quickly and repeatedly. Be separate hives or connect over a large area of skin. Sting or become painful. Turn white when pressed in the center (blanch). In severe cases, your hands, feet, and face may also become swollen.  This may occur if hives develop deeper in your skin. How is this diagnosed? This condition may be diagnosed by your symptoms, medical history, and physical exam. Your skin, urine, or blood may be tested to find out what is causing your hives and to rule out other health issues. Your health care provider may  also remove a small sample of skin from the affected area and examine it under a microscope (biopsy). How is this treated? Treatment for this condition depends on the cause and severity of your symptoms. Your health care provider may recommend using cool, wet cloths (cool compresses) or taking cool showers to relieve itching. Treatment may include: Medicines that help: Relieve itching (antihistamines). Reduce swelling (corticosteroids). Treat infection (antibiotics). An injectable medicine (omalizumab). Your health care provider may prescribe this if you have chronic idiopathic hives and you continue to have symptoms even after treatment with antihistamines. Severe cases may require an emergency injection of adrenaline (epinephrine) to prevent a life-threatening allergic reaction (anaphylaxis). Follow these instructions at home: Medicines Take and apply over-the-counter and prescription medicines only as told by your health care provider. If you were prescribed an antibiotic medicine, take it as told by your health care provider. Do not stop using the antibiotic even if you start to feel better. Skin care Apply cool compresses to the affected areas. Do not scratch or rub your skin. General instructions Do not take hot showers or baths. This can make itching worse. Do not wear tight-fitting clothing. Use sunscreen and wear protective clothing when you are outside. Avoid any substances that cause your hives. Keep a journal to help track what causes your hives. Write down: What medicines you take. What you eat and drink. What products you use on your skin. Keep all follow-up visits as told by your health care provider. This is important. Contact a health care provider if: Your symptoms are not controlled with medicine. Your joints are painful or swollen. Get help right away if: You have a fever. You have pain in your abdomen. Your tongue or lips are swollen. Your eyelids are  swollen. Your chest or throat feels tight. You have trouble breathing or swallowing. These symptoms may represent a serious problem that is an emergency. Do not wait to see if the symptoms will go away. Get medical help right away. Call your local emergency services (911 in the [place] Do not drive yourself to the hospital. Summary Hives (urticaria) are itchy, red, swollen areas on your skin. Hives come from the body's reaction to something a person is allergic to (allergen), something that causes irritation, or various other triggers. Treatment for this condition depends on the cause and severity of your symptoms. Avoid any substances that cause your hives. Keep a journal to help track what causes your hives. Take and apply over-the-counter and prescription medicines only as told by your health care provider. Get help right away if your chest or throat feels tight or if you have trouble breathing or swallowing. This information is not intended to replace advice given to you by your health care provider. Make sure you discuss any questions you have with your health care provider. Document Revised: 12/08/2021 Document Reviewed: 09/13/2020 Elsevier Patient Education  2023 Elsevier Inc.    If you have been instructed to have an in-person evaluation today at a local Urgent Care facility, please use the link below. It will take you to a list of all of our available [facility], including address, phone number and hours  of operation. Please do not delay care.  [facility]  If you or a family member do not have a primary care provider, use the link below to schedule a visit and establish care. When you choose a [facility] primary care physician or advanced practice provider, you gain a long-term partner in health. Find a Primary Care Provider  Learn more about [facility] in-office and virtual care options: [facility] - Get Care Now

## 2022-03-21 NOTE — Progress Notes (Signed)
Virtual Visit Consent   Rebecca Davies, you are scheduled for a virtual visit with a [facility] provider today. Just as with appointments in the office, your consent must be obtained to participate. Your consent will be active for this visit and any virtual visit you may have with one of our providers in the next 365 days. If you have a MyChart account, a copy of this consent can be sent to you electronically.  As this is a virtual visit, video technology does not allow for your provider to perform a traditional examination. This may limit your provider's ability to fully assess your condition. If your provider identifies any concerns that need to be evaluated in person or the need to arrange testing (such as labs, EKG, etc.), we will make arrangements to do so. Although advances in technology are sophisticated, we cannot ensure that it will always work on either your end or our end. If the connection with a video visit is poor, the visit may have to be switched to a telephone visit. With either a video or telephone visit, we are not always able to ensure that we have a secure connection.  By engaging in this virtual visit, you consent to the provision of healthcare and authorize for your insurance to be billed (if applicable) for the services provided during this visit. Depending on your insurance coverage, you may receive a charge related to this service.  I need to obtain your verbal consent now. Are you willing to proceed with your visit today? Rebecca Davies has provided verbal consent on 03/21/2022 for a virtual visit (video or telephone). Freddy Finner, NP  Date: 03/21/2022 9:01 AM  Virtual Visit via Video Note   I, Freddy Finner, connected with  Rebecca Davies  ([id number], 09-Jan-1986) on 03/21/22 at  9:00 AM EDT by a video-enabled telemedicine application and verified that I am speaking with the correct person using two identifiers.  Location: Patient: Virtual Visit Location Patient:  Home Provider: Virtual Visit Location Provider: Home Office   I discussed the limitations of evaluation and management by telemedicine and the availability of in person appointments. The patient expressed understanding and agreed to proceed.    History of Present Illness: Rebecca Davies is a 36 y.o. who identifies as a female who was assigned female at birth, and is being seen today for allergic reaction/hives. Known allergy to shellfish. Has not had them that she is aware of. Ate a [employer] tray. Started getting hives and itching on face, stomach, and thighs. Took benadryl last night and it improved. Came back this morning, but hives improved some itching left.   Problems:  Patient Active Problem List   Diagnosis Date Noted   Non-recurrent acute serous otitis media of left ear 09/10/2020   Need for influenza vaccination 09/10/2020   Leukocytes in urine 09/10/2020   Swelling of lymph node- left posterior cervical.  09/10/2020   Vitamin D insufficiency 09/10/2020   Body mass index 27.0-27.9, adult 09/10/2020   Dysuria 04/08/2020   Weakness of both legs 04/08/2020   Nausea and vomiting 04/08/2020   Generalized abdominal pain 04/08/2020   Renal cyst, left 04/08/2020   Leukocytosis 04/08/2020   Anal skin tag    Grade III hemorrhoids    Left hip pain 05/10/2017   Shellfish allergy 11/07/2016   Screening for blood or protein in urine 09/04/2014   Anxiety and depression 09/04/2014    Allergies:  Allergies  Allergen Reactions  Betadine [Povidone Iodine] Rash   Shellfish Allergy Swelling and Rash   Medications:  Current Outpatient Medications:    ALPRAZolam (XANAX) 0.5 MG tablet, Take 1 tablet (0.5 mg total) by mouth at bedtime as needed for anxiety., Disp: 30 tablet, Rfl: 1   EPINEPHrine (EPIPEN 2-PAK) 0.3 mg/0.3 mL IJ SOAJ injection, Inject 0.3 mg into the muscle as needed for anaphylaxis., Disp: 1 each, Rfl: 1  Current Facility-Administered Medications:    etonogestrel  (NEXPLANON) implant 68 mg, 68 mg, Subdermal, Once, Sharen Hones, [place], CNM  Observations/Objective: Patient is well-developed, well-nourished in no acute distress.  Resting comfortably  at home.  Head is normocephalic, atraumatic.  No labored breathing.  Speech is clear and coherent with logical content.  Patient is alert and oriented at baseline.    Assessment and Plan:  1. Hives  - predniSONE (DELTASONE) 20 MG tablet; Take 2 tablets (40 mg total) by mouth daily with breakfast for 3 days.  Dispense: 6 tablet; Refill: 0  -known allergy to shellfish- has not ingested any that she is aware of -benadryl use as needed, pred burst on hand if hives return in next several hours. -follow up with pcp to discuss allergy testing   Reviewed side effects, risks and benefits of medication.    Patient acknowledged agreement and understanding of the plan. .  Past Medical, Surgical, Social History, Allergies, and Medications have been Reviewed.    Follow Up Instructions: I discussed the assessment and treatment plan with the patient. The patient was provided an opportunity to ask questions and all were answered. The patient agreed with the plan and demonstrated an understanding of the instructions.  A copy of instructions were sent to the patient via MyChart unless otherwise noted below.     The patient was advised to call back or seek an in-person evaluation if the symptoms worsen or if the condition fails to improve as anticipated.  Time:  I spent 10 minutes with the patient via telehealth technology discussing the above problems/concerns.    Freddy Finner, NP

## 2023-02-06 DEATH — deceased

## 2023-11-30 ENCOUNTER — Ambulatory Visit: Admitting: Family Medicine

## 2023-11-30 ENCOUNTER — Encounter: Payer: Self-pay | Admitting: Family Medicine

## 2023-11-30 VITALS — BP 112/80 | HR 91 | Temp 98.6°F | Ht 61.0 in | Wt 162.0 lb

## 2023-11-30 DIAGNOSIS — Z862 Personal history of diseases of the blood and blood-forming organs and certain disorders involving the immune mechanism: Secondary | ICD-10-CM

## 2023-11-30 DIAGNOSIS — N289 Disorder of kidney and ureter, unspecified: Secondary | ICD-10-CM

## 2023-11-30 DIAGNOSIS — Z91013 Allergy to seafood: Secondary | ICD-10-CM | POA: Diagnosis not present

## 2023-11-30 DIAGNOSIS — E559 Vitamin D deficiency, unspecified: Secondary | ICD-10-CM

## 2023-11-30 DIAGNOSIS — E6609 Other obesity due to excess calories: Secondary | ICD-10-CM

## 2023-11-30 DIAGNOSIS — G4700 Insomnia, unspecified: Secondary | ICD-10-CM

## 2023-11-30 DIAGNOSIS — F32A Depression, unspecified: Secondary | ICD-10-CM

## 2023-11-30 DIAGNOSIS — Z6830 Body mass index (BMI) 30.0-30.9, adult: Secondary | ICD-10-CM

## 2023-11-30 DIAGNOSIS — G4730 Sleep apnea, unspecified: Secondary | ICD-10-CM

## 2023-11-30 DIAGNOSIS — R5383 Other fatigue: Secondary | ICD-10-CM

## 2023-11-30 DIAGNOSIS — F419 Anxiety disorder, unspecified: Secondary | ICD-10-CM

## 2023-11-30 DIAGNOSIS — R748 Abnormal levels of other serum enzymes: Secondary | ICD-10-CM

## 2023-11-30 DIAGNOSIS — Z7689 Persons encountering health services in other specified circumstances: Secondary | ICD-10-CM

## 2023-11-30 DIAGNOSIS — E66811 Obesity, class 1: Secondary | ICD-10-CM

## 2023-11-30 MED ORDER — EPINEPHRINE 0.3 MG/0.3ML IJ SOAJ: 0.3000 mg | INTRAMUSCULAR | 1 refills | Status: AC | PRN

## 2023-11-30 NOTE — Progress Notes (Signed)
 New Patient Office Visit  Subjective   Patient ID: Rebecca Davies, female    DOB: 08-19-1985  Age: 38 y.o. MRN: [id number]  CC:  Chief Complaint  Patient presents with   Establish Care   Fatigue    HPI [name] presents to establish care with new provider.  Patients previous primary care provider: [facility] with Arnaz Siganporia, FNP.  Prior to that it was [employer] [facility] with Ok Berber, NP back in 03/2020.   Specialist: [facility] with Dr. Jona Negro.   Anxiety: Chronic. Patient is prescribed Alprazolam  0.5mg  tablet, take 1 tablet at bedtime as needed. Patient reports she had this prescription either 2020 or 2021. She reports she had 30 tablets, but still has 25 tablets. Reports she rarely takes one.   Vitamin D  deficiency: Last vitamin D  was 14.7 on 09/25/2022. She was prescribed Vitamin D2 50,000IU tablet. Take 1 a week. Taking the last one tomorrow.   Patient reports she is still experiencing fatigue. Started about 1-2 years. Feels like she needs a nap daily and will have a nap or two daily. She tries to go to bed around 10:30-11:30 and back up at 5:45 or 6:00am. Also, reports she is snoring a lot during the night.  Outpatient Encounter Medications as of 11/30/2023  Medication Sig   ALPRAZolam  (XANAX ) 0.5 MG tablet Take 1 tablet (0.5 mg total) by mouth at bedtime as needed for anxiety.   [DISCONTINUED] EPINEPHrine  (EPIPEN  2-PAK) 0.3 mg/0.3 mL IJ SOAJ injection Inject 0.3 mg into the muscle as needed for anaphylaxis.   EPINEPHrine  (EPIPEN  2-PAK) 0.3 mg/0.3 mL IJ SOAJ injection Inject 0.3 mg into the muscle as needed for anaphylaxis.   Facility-Administered Encounter Medications as of 11/30/2023  Medication   etonogestrel  (NEXPLANON ) implant 68 mg    Past Medical History:  Diagnosis Date   Anxiety    Arthritis    FINGER, ELBOW   BRCA negative 06/2013   MyRisk neg   Chicken  pox    Depression    Family history of breast cancer    Family history of pancreatic cancer    GERD (gastroesophageal reflux disease)    OCC- NO MEDS   Migraine    H/O MIGRAINES   Vitamin D  deficiency     Past Surgical History:  Procedure Laterality Date   HEMORRHOID SURGERY N/A 04/05/2019   Procedure: HEMORRHOIDECTOMY, EXCISION ANAL SKIN TAG;  Surgeon: Mercy Stall, MD;  Location: [facility] ORS;  Service: General;  Laterality: N/A;   WISDOM TOOTH EXTRACTION  2013?    Family History  Problem Relation Age of Onset   Hyperlipidemia Mother    Mental illness Mother        anxiety and depression   Uterine cancer Mother    Alcohol abuse Father    Stroke Father    Deep vein thrombosis Father    Breast cancer Paternal Aunt    Hypertension Maternal Grandmother    Heart disease Maternal Grandmother    Stroke Maternal Grandmother    Mental illness Maternal Grandmother    Pancreatic cancer Maternal Grandmother 47       not genetic   Cancer Paternal Grandmother        Throat   Breast cancer Paternal Grandmother    Alcohol abuse Paternal Grandfather    Breast cancer Paternal Great-grandmother    Stomach cancer Neg Hx    Rectal cancer Neg Hx    Colon  cancer Neg Hx    Esophageal cancer Neg Hx     Social History   Socioeconomic History   Marital status: Divorced    Spouse name: Not on file   Number of children: 1   Years of education: Not on file   Highest education level: Some college, no degree  Occupational History   Not on file  Tobacco Use   Smoking status: Never   Smokeless tobacco: Never  Vaping Use   Vaping status: Never Used  Substance and Sexual Activity   Alcohol use: Yes    Alcohol/week: 1.0 standard drink of alcohol    Types: 1 Standard drinks or equivalent per week    Comment: 1 glass of wine every few weeks.    Drug use: No   Sexual activity: Yes    Partners: Male    Birth control/protection: Implant  Other Topics Concern   Not on file  Social  History Narrative   From [place] and residing in [place]   1 Son    2 cat and 1 dog in the house   Enjoys movies, going to eat, beauty products    Social Drivers of Health   Financial Resource Strain: Medium Risk (11/29/2023)   Overall Financial Resource Strain (CARDIA)    Difficulty of Paying Living Expenses: Somewhat hard  Food Insecurity: No Food Insecurity (11/29/2023)   Hunger Vital Sign    Worried About Running Out of Food in the Last Year: Never true    Ran Out of Food in the Last Year: Never true  Transportation Needs: No Transportation Needs (11/29/2023)   PRAPARE - Administrator, Civil Service (Medical): No    Lack of Transportation (Non-Medical): No  Physical Activity: Insufficiently Active (11/29/2023)   Exercise Vital Sign    Days of Exercise per Week: 1 day    Minutes of Exercise per Session: 30 min  Stress: No Stress Concern Present (11/29/2023)   [employer] of Occupational Health - Occupational Stress Questionnaire    Feeling of Stress : Only a little  Social Connections: Socially Isolated (11/29/2023)   Social Connection and Isolation Panel [NHANES]    Frequency of Communication with Friends and Family: Twice a week    Frequency of Social Gatherings with Friends and Family: Once a week    Attends Religious Services: Never    Database administrator or Organizations: No    Attends Engineer, structural: Not on file    Marital Status: Divorced  Intimate Partner Violence: Not At Risk (11/30/2023)   Humiliation, Afraid, Rape, and Kick questionnaire    Fear of Current or Ex-Partner: No    Emotionally Abused: No    Physically Abused: No    Sexually Abused: No    ROS See HPI above    Objective  BP 112/80   Pulse 91   Temp 98.6 F (37 C) (Oral)   Ht 5\' 1"  (1.549 m)   Wt 162 lb (73.5 kg)   SpO2 98%   BMI 30.61 kg/m   Physical Exam Vitals reviewed.  Constitutional:      General: She is not in acute distress.    Appearance:  Normal appearance. She is obese. She is not ill-appearing, toxic-appearing or diaphoretic.  HENT:     Head: Normocephalic and atraumatic.  Eyes:     General:        Right eye: No discharge.        Left eye: No discharge.  Conjunctiva/sclera: Conjunctivae normal.  Cardiovascular:     Rate and Rhythm: Normal rate and regular rhythm.     Heart sounds: Normal heart sounds. No murmur heard.    No friction rub. No gallop.  Pulmonary:     Effort: Pulmonary effort is normal. No respiratory distress.     Breath sounds: Normal breath sounds.  Musculoskeletal:        General: Normal range of motion.  Skin:    General: Skin is warm and dry.  Neurological:     General: No focal deficit present.     Mental Status: She is alert and oriented to person, place, and time. Mental status is at baseline.  Psychiatric:        Mood and Affect: Mood normal.        Behavior: Behavior normal.        Thought Content: Thought content normal.        Judgment: Judgment normal.      Assessment & Plan:  Fatigue, unspecified type -     CBC with Differential/Platelet -     Iron, TIBC and Ferritin Panel -     Comprehensive metabolic panel with GFR -     Vitamin B12  Insomnia, unspecified type -     Pulmonary Visit  Shellfish allergy -     EPINEPHrine ; Inject 0.3 mg into the muscle as needed for anaphylaxis.  Dispense: 1 each; Refill: 1  Sleep apnea, unspecified type -     Pulmonary Visit  Vitamin D  insufficiency -     VITAMIN D  25 Hydroxy (Vit-D Deficiency, Fractures)  Anxiety and depression  History of anemia -     CBC with Differential/Platelet -     Iron, TIBC and Ferritin Panel  Class 1 obesity due to excess calories without serious comorbidity with body mass index (BMI) of 30.0 to 30.9 in adult -     Hemoglobin A1c  Encounter to establish care  -Ordered labs (CBC, Iron Panel, A1c, Vitamin D , Vitamin B12, and CMP) based on complaints of fatigue, history of anemia, vitamin D  deficiency.  Office will call with lab results and she will see them on MyChart. -Referral placed to pulmonary for sleep apnea.  -Refilled Epinephrine  pen as needed for anaphylaxis to shellfish allergy. -Continue with Alprazolam  PRN. Informed patient if she needed a refill, a UDS and controlled substance contract would need to be obtained.   Return in about 1 year (around 11/29/2024) for physical.   Jaiquan Temme, NP

## 2023-11-30 NOTE — Patient Instructions (Addendum)
-  It was nice to meet you and look forward to taking care of you. -Ordered labs. Office will call with lab results and you will see them on MyChart. -Referral placed to pulmonary for sleep apnea. Please call the office or send a MyChart message  -Refilled Epinephrine  pen as needed for anaphylaxis to shellfish allergy. -Follow up in 1 year for physical.

## 2023-12-01 ENCOUNTER — Encounter: Payer: Self-pay | Admitting: Family Medicine

## 2023-12-01 LAB — CBC WITH DIFFERENTIAL/PLATELET
Basophils Absolute: 0.1 10*3/uL (ref 0.0–0.2)
Basos: 1 %
EOS (ABSOLUTE): 0.2 10*3/uL (ref 0.0–0.4)
Eos: 2 %
Hematocrit: 46.9 % — ABNORMAL HIGH (ref 34.0–46.6)
Hemoglobin: 15.8 g/dL (ref 11.1–15.9)
Immature Grans (Abs): 0 10*3/uL (ref 0.0–0.1)
Immature Granulocytes: 0 %
Lymphocytes Absolute: 3.8 10*3/uL — ABNORMAL HIGH (ref 0.7–3.1)
Lymphs: 39 %
MCH: 29.9 pg (ref 26.6–33.0)
MCHC: 33.7 g/dL (ref 31.5–35.7)
MCV: 89 fL (ref 79–97)
Monocytes Absolute: 0.7 10*3/uL (ref 0.1–0.9)
Monocytes: 7 %
Neutrophils Absolute: 5.1 10*3/uL (ref 1.4–7.0)
Neutrophils: 51 %
Platelets: 220 10*3/uL (ref 150–450)
RBC: 5.28 x10E6/uL (ref 3.77–5.28)
RDW: 12.2 % (ref 11.7–15.4)
WBC: 9.8 10*3/uL (ref 3.4–10.8)

## 2023-12-01 LAB — VITAMIN B12: Vitamin B-12: 543 pg/mL (ref 232–1245)

## 2023-12-01 LAB — COMPREHENSIVE METABOLIC PANEL WITH GFR
ALT: 55 IU/L — ABNORMAL HIGH (ref 0–32)
AST: 26 IU/L (ref 0–40)
Albumin: 4.5 g/dL (ref 3.9–4.9)
Alkaline Phosphatase: 111 IU/L (ref 44–121)
BUN/Creatinine Ratio: 16 (ref 9–23)
BUN: 12 mg/dL (ref 6–20)
Bilirubin Total: 0.8 mg/dL (ref 0.0–1.2)
CO2: 18 mmol/L — ABNORMAL LOW (ref 20–29)
Calcium: 9.6 mg/dL (ref 8.7–10.2)
Chloride: 104 mmol/L (ref 96–106)
Creatinine, Ser: 0.73 mg/dL (ref 0.57–1.00)
Globulin, Total: 2.4 g/dL (ref 1.5–4.5)
Glucose: 90 mg/dL (ref 70–99)
Potassium: 4.4 mmol/L (ref 3.5–5.2)
Sodium: 137 mmol/L (ref 134–144)
Total Protein: 6.9 g/dL (ref 6.0–8.5)
eGFR: 108 mL/min/{1.73_m2} (ref >59)

## 2023-12-01 LAB — HEMOGLOBIN A1C
Est. average glucose Bld gHb Est-mCnc: 120 mg/dL
Hgb A1c MFr Bld: 5.8 % — ABNORMAL HIGH (ref 4.8–5.6)

## 2023-12-01 LAB — VITAMIN D 25 HYDROXY (VIT D DEFICIENCY, FRACTURES): Vit D, 25-Hydroxy: 28.7 ng/mL — ABNORMAL LOW (ref 30.0–100.0)

## 2023-12-01 LAB — IRON,TIBC AND FERRITIN PANEL
Ferritin: 217 ng/mL — ABNORMAL HIGH (ref 15–150)
Iron Saturation: 23 % (ref 15–55)
Iron: 71 ug/dL (ref 27–159)
Total Iron Binding Capacity: 314 ug/dL (ref 250–450)
UIBC: 243 ug/dL (ref 131–425)

## 2023-12-01 NOTE — Addendum Note (Signed)
 Addended by: Teodoro Feller B on: 12/01/2023 08:26 AM   Modules accepted: Orders

## 2023-12-01 NOTE — Addendum Note (Signed)
 Addended by: Teodoro Feller B on: 12/01/2023 11:14 AM   Modules accepted: Orders

## 2023-12-01 NOTE — Addendum Note (Signed)
 Addended by: Teodoro Feller B on: 12/01/2023 05:06 PM   Modules accepted: Orders

## 2023-12-01 NOTE — Addendum Note (Signed)
 Addended by: Teodoro Feller B on: 12/01/2023 09:07 AM   Modules accepted: Orders

## 2023-12-05 ENCOUNTER — Telehealth: Payer: Self-pay

## 2023-12-05 NOTE — Telephone Encounter (Signed)
 Copied from CRM [id number]. Topic: Referral - Prior Authorization Question >> Dec 05, 2023 11:06 AM Freya Jesus wrote: Reason for CRM: Lovett Ruck from [facility] called and advised patient is scheduled with them for an MRI on Thursday May 1st and they need a prior authorization. [phone] ext: [phone]

## 2023-12-05 NOTE — Telephone Encounter (Signed)
 PA team can only do prior authorizations for prescriptions. Office will have to handle PA for MRI.

## 2023-12-07 ENCOUNTER — Ambulatory Visit (HOSPITAL_COMMUNITY)

## 2023-12-07 ENCOUNTER — Ambulatory Visit (HOSPITAL_COMMUNITY): Admission: RE | Admit: 2023-12-07 | Source: Ambulatory Visit

## 2023-12-07 ENCOUNTER — Encounter (HOSPITAL_COMMUNITY): Payer: Self-pay

## 2023-12-11 ENCOUNTER — Telehealth: Payer: Self-pay | Admitting: Family Medicine

## 2023-12-11 NOTE — Telephone Encounter (Signed)
 Copied from CRM [id number]. Topic: Referral - Prior Authorization Question >> Dec 08, 2023  4:51 PM Melissa C wrote: Reason for CRM: Alex from [facility] called and stated that MRI of patient's abdomen was approved and the facility just needs the orders to be faxed to them. Facility fax number is [phone]

## 2023-12-11 NOTE — Telephone Encounter (Signed)
 Noted. Orders faxed.

## 2023-12-20 LAB — ACUTE VIRAL HEPATITIS (HAV, HBV, HCV)
HCV Ab: NONREACTIVE
Hep A IgM: NEGATIVE
Hep B C IgM: NEGATIVE
Hepatitis B Surface Ag: NEGATIVE

## 2023-12-20 LAB — SPECIMEN STATUS REPORT

## 2023-12-20 LAB — HCV INTERPRETATION

## 2024-01-04 ENCOUNTER — Ambulatory Visit (HOSPITAL_BASED_OUTPATIENT_CLINIC_OR_DEPARTMENT_OTHER): Admitting: Nurse Practitioner

## 2024-01-05 ENCOUNTER — Encounter: Payer: Self-pay | Admitting: Family Medicine

## 2024-01-05 ENCOUNTER — Ambulatory Visit: Payer: Self-pay | Admitting: Family Medicine

## 2024-01-15 ENCOUNTER — Ambulatory Visit (HOSPITAL_BASED_OUTPATIENT_CLINIC_OR_DEPARTMENT_OTHER): Admitting: Nurse Practitioner

## 2024-01-15 ENCOUNTER — Encounter (HOSPITAL_BASED_OUTPATIENT_CLINIC_OR_DEPARTMENT_OTHER): Payer: Self-pay | Admitting: Nurse Practitioner

## 2024-01-15 VITALS — BP 121/95 | HR 103 | Ht 61.0 in | Wt 165.0 lb

## 2024-01-15 DIAGNOSIS — G4719 Other hypersomnia: Secondary | ICD-10-CM | POA: Insufficient documentation

## 2024-01-15 DIAGNOSIS — R0683 Snoring: Secondary | ICD-10-CM | POA: Diagnosis not present

## 2024-01-15 NOTE — Assessment & Plan Note (Signed)
 She has snoring, excessive daytime sleepiness, restless sleep, nocturnal apneic events?, morning headaches. BMI 31. Epworth 9. Given this,  I am concerned she could have sleep disordered breathing with obstructive sleep apnea. She will need sleep study for further evaluation.    - discussed how weight can impact sleep and risk for sleep disordered breathing - discussed options to assist with weight loss: combination of diet modification, cardiovascular and strength training exercises   - had an extensive discussion regarding the adverse health consequences related to untreated sleep disordered breathing - specifically discussed the risks for hypertension, coronary artery disease, cardiac dysrhythmias, cerebrovascular disease, and diabetes - lifestyle modification discussed   - discussed how sleep disruption can increase risk of accidents, particularly when driving - safe driving practices were discussed  Patient Instructions  Given your symptoms, I am concerned that you may have sleep disordered breathing with sleep apnea. You will need a sleep study for further evaluation. Someone will contact you to schedule this.   We discussed how untreated sleep apnea puts an individual at risk for cardiac arrhthymias, pulm HTN, DM, stroke and increases their risk for daytime accidents. We also briefly reviewed treatment options including weight loss, side sleeping position, oral appliance, CPAP therapy or referral to ENT for possible surgical options  Use caution when driving and pull over if you become sleepy.  Follow up in 6-8 weeks with Katie Niara Bunker,NP to go over sleep study results, or sooner, if needed. Friday PM virtual clinic preferred

## 2024-01-15 NOTE — Patient Instructions (Signed)
 Given your symptoms, I am concerned that you may have sleep disordered breathing with sleep apnea. You will need a sleep study for further evaluation. Someone will contact you to schedule this.   We discussed how untreated sleep apnea puts an individual at risk for cardiac arrhthymias, pulm HTN, DM, stroke and increases their risk for daytime accidents. We also briefly reviewed treatment options including weight loss, side sleeping position, oral appliance, CPAP therapy or referral to ENT for possible surgical options  Use caution when driving and pull over if you become sleepy.  Follow up in 6-8 weeks with Katie Caterra Ostroff,NP to go over sleep study results, or sooner, if needed. Friday PM virtual clinic preferred

## 2024-01-15 NOTE — Progress Notes (Signed)
 @Patient  ID: Rebecca Davies, female    DOB: [birth year], 38 y.o.   MRN: [id number]  Chief Complaint  Patient presents with   Establish Care    Sleep consult    Referring provider: Francenia Ingle, NP  HPI: 38 year old female, never smoker referred for sleep consult. Past medical history significant for shellfish allergy, anxiety.   TEST/EVENTS:   01/15/2024: Today - sleep consult Discussed the use of AI scribe software for clinical note transcription with the patient, who gave verbal consent to proceed.  History of Present Illness   Rebecca Davies is a 38 year old female who presents with concerns of snoring and possible sleep apnea.  She experiences snoring and daytime fatigue. She wakes up one or two times a night, often due to feeling too hot, and these symptoms have persisted for the past few years. She has not undergone a sleep study before.  She occasionally experiences morning headaches. She sometimes wakes up feeling like she hasn't been breathing, and her sleep apps indicate that she stops breathing during the night, with a snoring rate described as severe. No drowsy driving, sleep parasomnias/paralysis.   Her caffeine intake includes one or two cans of soda daily. She is allergic to shellfish and takes Benadryl  as needed, which she notes 'really knocks me out.' She does not take any sleep aids or OTC antihistamines regularly.  No excessive alcohol intake.  Goes to bed between 10 and 11 PM.  Falls asleep within 10 to 15 minutes.  Gets up around 5:45 AM.  Does not operate any heavy machinery in her job Animal nutritionist.  Weight is fluctuated 5 to 10 pounds over the last 2 years.  Never had a prior sleep study.  No supplemental oxygen use.  Epworth 9      Allergies  Allergen Reactions   Betadine [Povidone Iodine] Rash   Shellfish Allergy Swelling and Rash    Immunization History  Administered Date(s) Administered   Hepatitis A, Adult 05/16/2019, 11/15/2019   Influenza Split  04/08/2014   Influenza,inj,Quad PF,6+ Mos 07/15/2016, 05/10/2017, 09/19/2018, 09/10/2020   Tdap 10/29/2015    Past Medical History:  Diagnosis Date   Anxiety    Arthritis    FINGER, ELBOW   BRCA negative 06/2013   MyRisk neg   Chicken pox    Depression    Family history of breast cancer    Family history of pancreatic cancer    GERD (gastroesophageal reflux disease)    OCC- NO MEDS   Migraine    H/O MIGRAINES   Vitamin D  deficiency     Tobacco History: Social History   Tobacco Use  Smoking Status Never  Smokeless Tobacco Never   Counseling given: Not Answered   Outpatient Medications Prior to Visit  Medication Sig Dispense Refill   ALPRAZolam  (XANAX ) 0.5 MG tablet Take 1 tablet (0.5 mg total) by mouth at bedtime as needed for anxiety. 30 tablet 1   EPINEPHrine  (EPIPEN  2-PAK) 0.3 mg/0.3 mL IJ SOAJ injection Inject 0.3 mg into the muscle as needed for anaphylaxis. 1 each 1   Vitamin D , Ergocalciferol , (DRISDOL ) 1.25 MG (50000 UNIT) CAPS capsule Take 1 capsule by mouth once a week.     Facility-Administered Medications Prior to Visit  Medication Dose Route Frequency Provider Last Rate Last Admin   etonogestrel  (NEXPLANON ) implant 68 mg  68 mg Subdermal Once Avonne Lemons, CNM         Review of Systems:   Constitutional: No weight  loss or gain, night sweats, fevers, chills, , or lassitude. +fatigue  HEENT: No difficulty swallowing, tooth/dental problems, or sore throat. No sneezing, itching, ear ache, nasal congestion, or post nasal drip. +headaches CV:  No chest pain, orthopnea, PND, swelling in lower extremities, anasarca, dizziness, palpitations, syncope Resp: +snoring. No shortness of breath with exertion or at rest. No cough GI:  No heartburn, indigestion GU: No nocturia  Skin: No rash, lesions, ulcerations MSK:  No joint pain or swelling.   Neuro: No dizziness or lightheadedness.  Psych: No depression +stable anxiety. Mood stable. +sleep  disturbance    Physical Exam:  BP (!) 121/95   Pulse (!) 103   Ht 5\' 1"  (1.549 m)   Wt 165 lb (74.8 kg)   SpO2 97%   BMI 31.18 kg/m   GEN: Pleasant, interactive, well-appearing; obese; in no acute distress HEENT:  Normocephalic and atraumatic. PERRLA. Sclera white. Nasal turbinates pink, moist and patent bilaterally. No rhinorrhea present. Oropharynx pink and moist, without exudate or edema. No lesions, ulcerations, or postnasal drip. Mallampati II/III NECK:  Supple w/ fair ROM. Thyroid symmetrical with no goiter or nodules palpated. No lymphadenopathy.   CV: RRR, no m/r/g, no peripheral edema. Pulses intact, +2 bilaterally. No cyanosis, pallor or clubbing. PULMONARY:  Unlabored, regular breathing. Clear bilaterally A&P w/o wheezes/rales/rhonchi. No accessory muscle use.  GI: BS present and normoactive. Soft, non-tender to palpation. No organomegaly or masses detected. MSK: No erythema, warmth or tenderness. Cap refil <2 sec all extrem. No deformities or joint swelling noted.  Neuro: A/Ox3. No focal deficits noted.   Skin: Warm, no lesions or rashe Psych: Normal affect and behavior. Judgement and thought content appropriate.     Lab Results:  CBC    Component Value Date/Time   WBC 9.8 11/30/2023 1132   WBC 10.2 04/30/2020 1526   WBC 15.4 (H) 04/06/2020 2049   RBC 5.28 11/30/2023 1132   RBC 5.02 04/30/2020 1526   HGB 15.8 11/30/2023 1132   HCT 46.9 (H) 11/30/2023 1132   PLT 220 11/30/2023 1132   MCV 89 11/30/2023 1132   MCH 29.9 11/30/2023 1132   MCH 29.5 04/30/2020 1526   MCHC 33.7 11/30/2023 1132   MCHC 34.7 04/30/2020 1526   RDW 12.2 11/30/2023 1132   LYMPHSABS 3.8 (H) 11/30/2023 1132   MONOABS 0.6 04/30/2020 1526   EOSABS 0.2 11/30/2023 1132   BASOSABS 0.1 11/30/2023 1132    BMET    Component Value Date/Time   NA 137 11/30/2023 1132   K 4.4 11/30/2023 1132   CL 104 11/30/2023 1132   CO2 18 (L) 11/30/2023 1132   GLUCOSE 90 11/30/2023 1132   GLUCOSE 125  (H) 04/06/2020 2049   BUN 12 11/30/2023 1132   CREATININE 0.73 11/30/2023 1132   CALCIUM 9.6 11/30/2023 1132   GFRNONAA 96 09/11/2020 1459   GFRAA 110 09/11/2020 1459    BNP No results found for: "BNP"   Imaging:  No results found.  Administration History     None           No data to display          No results found for: "NITRICOXIDE"      Assessment & Plan:   Excessive daytime sleepiness She has snoring, excessive daytime sleepiness, restless sleep, nocturnal apneic events?, morning headaches. BMI 31. Epworth 9. Given this,  I am concerned she could have sleep disordered breathing with obstructive sleep apnea. She will need sleep study for further evaluation.    -  discussed how weight can impact sleep and risk for sleep disordered breathing - discussed options to assist with weight loss: combination of diet modification, cardiovascular and strength training exercises   - had an extensive discussion regarding the adverse health consequences related to untreated sleep disordered breathing - specifically discussed the risks for hypertension, coronary artery disease, cardiac dysrhythmias, cerebrovascular disease, and diabetes - lifestyle modification discussed   - discussed how sleep disruption can increase risk of accidents, particularly when driving - safe driving practices were discussed  Patient Instructions  Given your symptoms, I am concerned that you may have sleep disordered breathing with sleep apnea. You will need a sleep study for further evaluation. Someone will contact you to schedule this.   We discussed how untreated sleep apnea puts an individual at risk for cardiac arrhthymias, pulm HTN, DM, stroke and increases their risk for daytime accidents. We also briefly reviewed treatment options including weight loss, side sleeping position, oral appliance, CPAP therapy or referral to ENT for possible surgical options  Use caution when driving and pull  over if you become sleepy.  Follow up in 6-8 weeks with Katie Almarie Kurdziel,NP to go over sleep study results, or sooner, if needed. Friday PM virtual clinic preferred       Snoring See above   Advised if symptoms do not improve or worsen, to please contact office for sooner follow up or seek emergency care.   I spent 35 minutes of dedicated to the care of this patient on the date of this encounter to include pre-visit review of records, face-to-face time with the patient discussing conditions above, post visit ordering of testing, clinical documentation with the electronic health record, making appropriate referrals as documented, and communicating necessary findings to members of the patients care team.  Roetta Clarke, NP 01/15/2024  Pt aware and understands NP's role.

## 2024-01-15 NOTE — Progress Notes (Signed)
 Epworth Sleepiness Scale  Use the following scale to choose the most appropriate number for each situation. 0 Would never nod off 1  Slight  chance of nodding off 2 Moderate chance of nodding off 3 High chance of nodding off  Sitting and reading: 2 Watching TV: 2 Sitting, inactive, in a public place (e.g., in a meeting, theater, or dinner event): 0 As a passenger in a car for an hour or more without stopping for a break: 1 Lying down to rest when circumstances permit:3 Sitting and talking to someone: 0 Sitting quietly after a meal without alcohol: 1 In a car, while stopped for a few  minutes in traffic or at a light: 0  TOTOAL: 9

## 2024-01-15 NOTE — Assessment & Plan Note (Signed)
 See above

## 2024-01-22 ENCOUNTER — Encounter

## 2024-01-22 DIAGNOSIS — G4719 Other hypersomnia: Secondary | ICD-10-CM

## 2024-02-04 DIAGNOSIS — G4733 Obstructive sleep apnea (adult) (pediatric): Secondary | ICD-10-CM | POA: Diagnosis not present

## 2024-02-07 ENCOUNTER — Ambulatory Visit: Payer: Self-pay | Admitting: Nurse Practitioner

## 2024-02-28 ENCOUNTER — Encounter: Payer: Self-pay | Admitting: Family Medicine

## 2024-02-28 ENCOUNTER — Other Ambulatory Visit: Payer: Self-pay | Admitting: Adult Health

## 2024-02-28 ENCOUNTER — Other Ambulatory Visit

## 2024-02-28 DIAGNOSIS — E559 Vitamin D deficiency, unspecified: Secondary | ICD-10-CM

## 2024-02-28 DIAGNOSIS — R1084 Generalized abdominal pain: Secondary | ICD-10-CM

## 2024-02-28 NOTE — Progress Notes (Signed)
 Patient's PCP is out of the office, lab notified me that she needs her labs switched to [facility].  Will place orders for hepatitis acute panel and vitamin D  to go to [facility]

## 2024-02-29 LAB — VITAMIN D 25 HYDROXY (VIT D DEFICIENCY, FRACTURES): Vit D, 25-Hydroxy: 33.7 ng/mL (ref 30.0–100.0)

## 2024-03-05 ENCOUNTER — Ambulatory Visit: Payer: Self-pay | Admitting: Family Medicine

## 2024-03-06 ENCOUNTER — Ambulatory Visit (HOSPITAL_BASED_OUTPATIENT_CLINIC_OR_DEPARTMENT_OTHER): Admitting: Nurse Practitioner

## 2024-03-08 ENCOUNTER — Telehealth: Admitting: Nurse Practitioner

## 2024-03-08 ENCOUNTER — Other Ambulatory Visit (HOSPITAL_COMMUNITY): Payer: Self-pay

## 2024-03-08 ENCOUNTER — Encounter: Payer: Self-pay | Admitting: Nurse Practitioner

## 2024-03-08 DIAGNOSIS — E66811 Obesity, class 1: Secondary | ICD-10-CM | POA: Diagnosis not present

## 2024-03-08 DIAGNOSIS — Z6831 Body mass index (BMI) 31.0-31.9, adult: Secondary | ICD-10-CM

## 2024-03-08 DIAGNOSIS — G4733 Obstructive sleep apnea (adult) (pediatric): Secondary | ICD-10-CM

## 2024-03-08 DIAGNOSIS — E669 Obesity, unspecified: Secondary | ICD-10-CM

## 2024-03-08 NOTE — Assessment & Plan Note (Signed)
 Mild OSA. Discussed minimal cardiovascular risks associated with mild OSA. Reviewed treatment options. She would like to focus on weight management and will consider oral appliance, pending insurance coverage. Safe driving practices reviewed.  Patient Instructions  Your sleep study showed mild sleep apnea. We reviewed there are minimal associated health risks associated with mild sleep apnea. We also briefly reviewed treatment options including weight loss, side sleeping position, oral appliance, CPAP therapy   You would like to work on weight loss measures to hopefully reduce or eliminate your sleep apnea. Discuss with your insurance to see if Zepbound is covered for weight management. You wouldn't be able to get it approved for sleep apnea, since your sleep apnea is not moderate to severe If this is not an option, discuss weight management with your primary care provider  You can also check on coverage for an oral appliance  Will plan your follow up once we know about the weight management options and oral appliance. If symptoms do not improve or worsen, please contact office for sooner follow up or seek emergency care.

## 2024-03-08 NOTE — Progress Notes (Deleted)
 @Patient  ID: Rebecca Davies, female    DOB: 07/27/1986, 37 y.o.   MRN: [id number]  No chief complaint on file.   Referring provider: Billy Philippe SAUNDERS, NP  HPI: 38 year old female, never smoker followed for mild sleep apnea. Past medical history significant for shellfish allergy, anxiety.    TEST/EVENTS:    01/15/2024: OV with Meghanne Pletz,NP Discussed the use of AI scribe software for clinical note transcription with the patient, who gave verbal consent to proceed. Rebecca Davies is a 38 year old female who presents with concerns of snoring and possible sleep apnea. She experiences snoring and daytime fatigue. She wakes up one or two times a night, often due to feeling too hot, and these symptoms have persisted for the past few years. She has not undergone a sleep study before. She occasionally experiences morning headaches. She sometimes wakes up feeling like she hasn't been breathing, and her sleep apps indicate that she stops breathing during the night, with a snoring rate described as severe. No drowsy driving, sleep parasomnias/paralysis.  Her caffeine intake includes one or two cans of soda daily. She is allergic to shellfish and takes Benadryl  as needed, which she notes 'really knocks me out.' She does not take any sleep aids or OTC antihistamines regularly. No excessive alcohol intake.  Goes to bed between 10 and 11 PM.  Falls asleep within 10 to 15 minutes.  Gets up around 5:45 AM.  Does not operate any heavy machinery in her job Animal nutritionist.  Weight is fluctuated 5 to 10 pounds over the last 2 years.  Never had a prior sleep study.  No supplemental oxygen use. Epworth 9  Allergies  Allergen Reactions   Betadine [Povidone Iodine] Rash   Shellfish Allergy Swelling and Rash    Immunization History  Administered Date(s) Administered   Hepatitis A, Adult 05/16/2019, 11/15/2019   Influenza Split 04/08/2014   Influenza,inj,Quad PF,6+ Mos 07/15/2016, 05/10/2017, 09/19/2018, 09/10/2020   Tdap  10/29/2015    Past Medical History:  Diagnosis Date   Anxiety    Arthritis    FINGER, ELBOW   BRCA negative 06/2013   MyRisk neg   Chicken pox    Depression    Family history of breast cancer    Family history of pancreatic cancer    GERD (gastroesophageal reflux disease)    OCC- NO MEDS   Migraine    H/O MIGRAINES   Vitamin D  deficiency     Tobacco History: Social History   Tobacco Use  Smoking Status Never  Smokeless Tobacco Never   Counseling given: Not Answered   Outpatient Medications Prior to Visit  Medication Sig Dispense Refill   ALPRAZolam  (XANAX ) 0.5 MG tablet Take 1 tablet (0.5 mg total) by mouth at bedtime as needed for anxiety. 30 tablet 1   EPINEPHrine  (EPIPEN  2-PAK) 0.3 mg/0.3 mL IJ SOAJ injection Inject 0.3 mg into the muscle as needed for anaphylaxis. 1 each 1   Vitamin D , Ergocalciferol , (DRISDOL ) 1.25 MG (50000 UNIT) CAPS capsule Take 1 capsule by mouth once a week.     Facility-Administered Medications Prior to Visit  Medication Dose Route Frequency Provider Last Rate Last Admin   etonogestrel  (NEXPLANON ) implant 68 mg  68 mg Subdermal Once Rilla Barnacle, CNM         Review of Systems:   Constitutional: No weight loss or gain, night sweats, fevers, chills, fatigue, or lassitude. HEENT: No headaches, difficulty swallowing, tooth/dental problems, or sore throat. No sneezing, itching, ear ache,  nasal congestion, or post nasal drip CV:  No chest pain, orthopnea, PND, swelling in lower extremities, anasarca, dizziness, palpitations, syncope Resp: No shortness of breath with exertion or at rest. No excess mucus or change in color of mucus. No productive or non-productive. No hemoptysis. No wheezing.  No chest wall deformity GI:  No heartburn, indigestion, abdominal pain, nausea, vomiting, diarrhea, change in bowel habits, loss of appetite, bloody stools.  GU: No dysuria, change in color of urine, urgency or frequency.  No flank pain, no hematuria   Skin: No rash, lesions, ulcerations MSK:  No joint pain or swelling.  No decreased range of motion.  No back pain. Neuro: No dizziness or lightheadedness.  Psych: No depression or anxiety. Mood stable.     Physical Exam:  There were no vitals taken for this visit.  GEN: Pleasant, interactive, well-nourished/chronically-ill appearing/acutely-ill appearing/poorly-nourished/morbidly obese; in no acute distress.****** HEENT:  Normocephalic and atraumatic. EACs patent bilaterally. TM pearly gray with present light reflex bilaterally. PERRLA. Sclera white. Nasal turbinates pink, moist and patent bilaterally. No rhinorrhea present. Oropharynx pink and moist, without exudate or edema. No lesions, ulcerations, or postnasal drip.  NECK:  Supple w/ fair ROM. No JVD present. Normal carotid impulses w/o bruits. Thyroid symmetrical with no goiter or nodules palpated. No lymphadenopathy.   CV: RRR, no m/r/g, no peripheral edema. Pulses intact, +2 bilaterally. No cyanosis, pallor or clubbing. PULMONARY:  Unlabored, regular breathing. Clear bilaterally A&P w/o wheezes/rales/rhonchi. No accessory muscle use.  GI: BS present and normoactive. Soft, non-tender to palpation. No organomegaly or masses detected. No CVA tenderness. MSK: No erythema, warmth or tenderness. Cap refil <2 sec all extrem. No deformities or joint swelling noted.  Neuro: A/Ox3. No focal deficits noted.   Skin: Warm, no lesions or rashe Psych: Normal affect and behavior. Judgement and thought content appropriate.     Lab Results:  CBC    Component Value Date/Time   WBC 9.8 11/30/2023 1132   WBC 10.2 04/30/2020 1526   WBC 15.4 (H) 04/06/2020 2049   RBC 5.28 11/30/2023 1132   RBC 5.02 04/30/2020 1526   HGB 15.8 11/30/2023 1132   HCT 46.9 (H) 11/30/2023 1132   PLT 220 11/30/2023 1132   MCV 89 11/30/2023 1132   MCH 29.9 11/30/2023 1132   MCH 29.5 04/30/2020 1526   MCHC 33.7 11/30/2023 1132   MCHC 34.7 04/30/2020 1526   RDW  12.2 11/30/2023 1132   LYMPHSABS 3.8 (H) 11/30/2023 1132   MONOABS 0.6 04/30/2020 1526   EOSABS 0.2 11/30/2023 1132   BASOSABS 0.1 11/30/2023 1132    BMET    Component Value Date/Time   NA 137 11/30/2023 1132   K 4.4 11/30/2023 1132   CL 104 11/30/2023 1132   CO2 18 (L) 11/30/2023 1132   GLUCOSE 90 11/30/2023 1132   GLUCOSE 125 (H) 04/06/2020 2049   BUN 12 11/30/2023 1132   CREATININE 0.73 11/30/2023 1132   CALCIUM 9.6 11/30/2023 1132   GFRNONAA 96 09/11/2020 1459   GFRAA 110 09/11/2020 1459    BNP No results found for: BNP   Imaging:  No results found.  Administration History     None           No data to display          No results found for: NITRICOXIDE      Assessment & Plan:   No problem-specific Assessment & Plan notes found for this encounter.   Advised if symptoms do not improve or  worsen, to please contact office for sooner follow up or seek emergency care.   I spent *** minutes of dedicated to the care of this patient on the date of this encounter to include pre-visit review of records, face-to-face time with the patient discussing conditions above, post visit ordering of testing, clinical documentation with the electronic health record, making appropriate referrals as documented, and communicating necessary findings to members of the patients care team.  Comer LULLA Rouleau, NP 03/08/2024  Pt aware and understands NP's role.

## 2024-03-08 NOTE — Assessment & Plan Note (Signed)
 BMI 31. Discussed healthy weight management strategies. Could consider GLP-1 therapy with Zepbound for weight management; not covered for mild OSA. Advised pt of this. She will reach out to her insurance to see what her coverage options are. If Zepbound not covered for obesity management, advised that she discuss options with her PCP.

## 2024-03-08 NOTE — Patient Instructions (Signed)
 Your sleep study showed mild sleep apnea. We reviewed there are minimal associated health risks associated with mild sleep apnea. We also briefly reviewed treatment options including weight loss, side sleeping position, oral appliance, CPAP therapy   You would like to work on weight loss measures to hopefully reduce or eliminate your sleep apnea. Discuss with your insurance to see if Zepbound is covered for weight management. You wouldn't be able to get it approved for sleep apnea, since your sleep apnea is not moderate to severe If this is not an option, discuss weight management with your primary care provider  You can also check on coverage for an oral appliance  Will plan your follow up once we know about the weight management options and oral appliance. If symptoms do not improve or worsen, please contact office for sooner follow up or seek emergency care.

## 2024-03-08 NOTE — Progress Notes (Signed)
 Patient ID: Rebecca Davies, female     DOB: 17-Aug-1985, 38 y.o.      MRN: [id number]  No chief complaint on file.   Virtual Visit via Video Note  I connected with Rebecca Davies on 03/08/24 at  1:30 PM EDT by a video enabled telemedicine application and verified that I am speaking with the correct person using two identifiers.  Location: Patient: Home Provider: Office   I discussed the limitations of evaluation and management by telemedicine and the availability of in person appointments. The patient expressed understanding and agreed to proceed.  History of Present Illness: 38 year old female, never smoker followed for mild sleep apnea. Past medical history significant for shellfish allergy, anxiety.    TEST/EVENTS:  01/22/2024 HST: AHI 5.2/h, SpO2 low 85%  01/15/2024: OV with Madex Seals,NP Discussed the use of AI scribe software for clinical note transcription with the patient, who gave verbal consent to proceed. Rebecca Davies is a 38 year old female who presents with concerns of snoring and possible sleep apnea. She experiences snoring and daytime fatigue. She wakes up one or two times a night, often due to feeling too hot, and these symptoms have persisted for the past few years. She has not undergone a sleep study before. She occasionally experiences morning headaches. She sometimes wakes up feeling like she hasn't been breathing, and her sleep apps indicate that she stops breathing during the night, with a snoring rate described as severe. No drowsy driving, sleep parasomnias/paralysis.  Her caffeine intake includes one or two cans of soda daily. She is allergic to shellfish and takes Benadryl  as needed, which she notes 'really knocks me out.' She does not take any sleep aids or OTC antihistamines regularly. No excessive alcohol intake.  Goes to bed between 10 and 11 PM.  Falls asleep within 10 to 15 minutes.  Gets up around 5:45 AM.  Does not operate any heavy machinery in her job Animal nutritionist.   Weight is fluctuated 5 to 10 pounds over the last 2 years.  Never had a prior sleep study.  No supplemental oxygen use. Epworth 9  03/08/2024: Today - follow up Discussed the use of AI scribe software for clinical note transcription with the patient, who gave verbal consent to proceed.  History of Present Illness Rebecca Davies is a 38 year old female who presents with mild sleep apnea.  She underwent a sleep study which confirmed mild sleep apnea.  She feels unchanged compared to her last visit. Difficulties with daytime fatigue. No drowsy driving or sleep parasomnias/paralysis. Wants to discuss treatment options. No other concerns.   Allergies  Allergen Reactions   Betadine [Povidone Iodine] Rash   Shellfish Allergy Swelling and Rash   Immunization History  Administered Date(s) Administered   Hepatitis A, Adult 05/16/2019, 11/15/2019   Influenza Split 04/08/2014   Influenza,inj,Quad PF,6+ Mos 07/15/2016, 05/10/2017, 09/19/2018, 09/10/2020   Tdap 10/29/2015   Past Medical History:  Diagnosis Date   Anxiety    Arthritis    FINGER, ELBOW   BRCA negative 06/2013   MyRisk neg   Chicken pox    Depression    Family history of breast cancer    Family history of pancreatic cancer    GERD (gastroesophageal reflux disease)    OCC- NO MEDS   Migraine    H/O MIGRAINES   Vitamin D  deficiency     Tobacco History: Social History   Tobacco Use  Smoking Status Never  Smokeless Tobacco Never  Counseling given: Not Answered   Outpatient Medications Prior to Visit  Medication Sig Dispense Refill   ALPRAZolam  (XANAX ) 0.5 MG tablet Take 1 tablet (0.5 mg total) by mouth at bedtime as needed for anxiety. 30 tablet 1   EPINEPHrine  (EPIPEN  2-PAK) 0.3 mg/0.3 mL IJ SOAJ injection Inject 0.3 mg into the muscle as needed for anaphylaxis. 1 each 1   Vitamin D , Ergocalciferol , (DRISDOL ) 1.25 MG (50000 UNIT) CAPS capsule Take 1 capsule by mouth once a week.     Facility-Administered  Medications Prior to Visit  Medication Dose Route Frequency Provider Last Rate Last Admin   etonogestrel  (NEXPLANON ) implant 68 mg  68 mg Subdermal Once Rilla Barnacle, CNM         Review of Systems:   Constitutional: No weight loss or gain, night sweats, fevers, chills, , or lassitude. +fatigue  HEENT:  +headaches CV:  No chest pain, orthopnea, PND,  palpitations Resp: +snoring. GI:  No heartburn, indigestion GU: No nocturia  Neuro: No dizziness or lightheadedness.  Psych: No depression +stable anxiety. Mood stable. +sleep disturbance  Observations/Objective: Patient is well-developed, well-nourished in no acute distress.  Resting comfortably at home.  No labored breathing.  Speech is clear and coherent with logical content.  Patient is alert and oriented at baseline.   Assessment and Plan: Mild obstructive sleep apnea Mild OSA. Discussed minimal cardiovascular risks associated with mild OSA. Reviewed treatment options. She would like to focus on weight management and will consider oral appliance, pending insurance coverage. Safe driving practices reviewed.  Patient Instructions  Your sleep study showed mild sleep apnea. We reviewed there are minimal associated health risks associated with mild sleep apnea. We also briefly reviewed treatment options including weight loss, side sleeping position, oral appliance, CPAP therapy   You would like to work on weight loss measures to hopefully reduce or eliminate your sleep apnea. Discuss with your insurance to see if Zepbound is covered for weight management. You wouldn't be able to get it approved for sleep apnea, since your sleep apnea is not moderate to severe If this is not an option, discuss weight management with your primary care provider  You can also check on coverage for an oral appliance  Will plan your follow up once we know about the weight management options and oral appliance. If symptoms do not improve or worsen,  please contact office for sooner follow up or seek emergency care.     Class 1 obesity with body mass index (BMI) of 31.0 to 31.9 in adult BMI 31. Discussed healthy weight management strategies. Could consider GLP-1 therapy with Zepbound for weight management; not covered for mild OSA. Advised pt of this. She will reach out to her insurance to see what her coverage options are. If Zepbound not covered for obesity management, advised that she discuss options with her PCP.      I discussed the assessment and treatment plan with the patient. The patient was provided an opportunity to ask questions and all were answered. The patient agreed with the plan and demonstrated an understanding of the instructions.   The patient was advised to call back or seek an in-person evaluation if the symptoms worsen or if the condition fails to improve as anticipated.  I provided 31 minutes of non-face-to-face time during this encounter.   Comer LULLA Rouleau, NP

## 2024-03-11 ENCOUNTER — Telehealth: Payer: Self-pay | Admitting: Primary Care

## 2024-03-11 NOTE — Telephone Encounter (Signed)
 Copied from CRM #[id number]. Topic: Appointments - Scheduling Inquiry for Clinic >> Mar 11, 2024 12:06 PM Celestine FALCON wrote: Reason for CRM: Pt is requesting to have a lab appt made for today for labs TSH, Lipid panel, Hemoglobin A1c, comprehensive metabolic panel with GFR, and CBC with Differential/Platelet.   Provided the lab info from [facility]- Appointments will need to be made for lab appointments at [employer], use provider/ resource, [facility]. If making the lab appointment over the phone, please ensure there is a lab order in for our office.      If there is no lab order, please send CRM to [facility]. Patients can still walk in, but the patient will need to stop by the front desk to make the appointment and get checked in.     M-Thurs:   8:30AM - 12PM   1:00 - 4:30PM  Fri: 8:30AM - 12PM  Pt will be coming in today for labs, but would like the labs to be sent over [facility]. Pt's phone number is [phone] ok to leave a vm.

## 2024-03-11 NOTE — Telephone Encounter (Signed)
 Rebecca Davies, can you send the labs I ordered to [facility], please?  Zepbound is titrated typically every 4 weeks so there would not be a 90 day rx until she is on maintenance dosing.

## 2024-03-12 ENCOUNTER — Other Ambulatory Visit

## 2024-03-12 ENCOUNTER — Other Ambulatory Visit: Payer: Self-pay

## 2024-03-12 DIAGNOSIS — E669 Obesity, unspecified: Secondary | ICD-10-CM

## 2024-03-12 DIAGNOSIS — G4733 Obstructive sleep apnea (adult) (pediatric): Secondary | ICD-10-CM

## 2024-03-12 NOTE — Telephone Encounter (Signed)
 This was completed already- closing encounter.

## 2024-03-13 LAB — LIPID PANEL
Chol/HDL Ratio: 7.3 ratio — ABNORMAL HIGH (ref 0.0–4.4)
Cholesterol, Total: 225 mg/dL — ABNORMAL HIGH (ref 100–199)
HDL: 31 mg/dL — ABNORMAL LOW (ref >39)
LDL Chol Calc (NIH): 106 mg/dL — ABNORMAL HIGH (ref 0–99)
Triglycerides: 514 mg/dL — ABNORMAL HIGH (ref 0–149)
VLDL Cholesterol Cal: 88 mg/dL — ABNORMAL HIGH (ref 5–40)

## 2024-03-13 LAB — COMPREHENSIVE METABOLIC PANEL WITH GFR
ALT: 138 IU/L — ABNORMAL HIGH (ref 0–32)
AST: 88 IU/L — ABNORMAL HIGH (ref 0–40)
Albumin: 4.8 g/dL (ref 3.9–4.9)
Alkaline Phosphatase: 102 IU/L (ref 44–121)
BUN/Creatinine Ratio: 15 (ref 9–23)
BUN: 13 mg/dL (ref 6–20)
Bilirubin Total: 1.1 mg/dL (ref 0.0–1.2)
CO2: 15 mmol/L — ABNORMAL LOW (ref 20–29)
Calcium: 10.3 mg/dL — ABNORMAL HIGH (ref 8.7–10.2)
Chloride: 106 mmol/L (ref 96–106)
Creatinine, Ser: 0.86 mg/dL (ref 0.57–1.00)
Globulin, Total: 3 g/dL (ref 1.5–4.5)
Glucose: 99 mg/dL (ref 70–99)
Potassium: 4.5 mmol/L (ref 3.5–5.2)
Sodium: 140 mmol/L (ref 134–144)
Total Protein: 7.8 g/dL (ref 6.0–8.5)
eGFR: 88 mL/min/1.73 (ref >59)

## 2024-03-13 LAB — CBC WITH DIFFERENTIAL/PLATELET
Basophils Absolute: 0.1 x10E3/uL (ref 0.0–0.2)
Basos: 1 %
EOS (ABSOLUTE): 0.2 x10E3/uL (ref 0.0–0.4)
Eos: 2 %
Hematocrit: 52.3 % — ABNORMAL HIGH (ref 34.0–46.6)
Hemoglobin: 17.2 g/dL — ABNORMAL HIGH (ref 11.1–15.9)
Immature Grans (Abs): 0 x10E3/uL (ref 0.0–0.1)
Immature Granulocytes: 0 %
Lymphocytes Absolute: 3.5 x10E3/uL — ABNORMAL HIGH (ref 0.7–3.1)
Lymphs: 37 %
MCH: 30 pg (ref 26.6–33.0)
MCHC: 32.9 g/dL (ref 31.5–35.7)
MCV: 91 fL (ref 79–97)
Monocytes Absolute: 0.6 x10E3/uL (ref 0.1–0.9)
Monocytes: 6 %
Neutrophils Absolute: 5.2 x10E3/uL (ref 1.4–7.0)
Neutrophils: 54 %
Platelets: 258 x10E3/uL (ref 150–450)
RBC: 5.73 x10E6/uL — ABNORMAL HIGH (ref 3.77–5.28)
RDW: 12.9 % (ref 11.7–15.4)
WBC: 9.5 x10E3/uL (ref 3.4–10.8)

## 2024-03-13 LAB — HEMOGLOBIN A1C
Est. average glucose Bld gHb Est-mCnc: 108 mg/dL
Hgb A1c MFr Bld: 5.4 % (ref 4.8–5.6)

## 2024-03-13 LAB — THYROID PANEL WITH TSH
Free Thyroxine Index: 2.5 (ref 1.2–4.9)
T3 Uptake Ratio: 30 % (ref 24–39)
T4, Total: 8.2 ug/dL (ref 4.5–12.0)
TSH: 1.68 u[IU]/mL (ref 0.450–4.500)

## 2024-03-14 ENCOUNTER — Ambulatory Visit: Payer: Self-pay | Admitting: Nurse Practitioner

## 2024-03-14 DIAGNOSIS — D751 Secondary polycythemia: Secondary | ICD-10-CM

## 2024-03-14 NOTE — Progress Notes (Signed)
 Left pt message that I was sending results in My Chart or she could try to call back and we will go over labs results

## 2024-03-14 NOTE — Progress Notes (Signed)
 Pt has high triglycerides and high cholesterol. She needs to make an appt with her PCP. She also has new onset polycythemia (high red blood cells) and she's had an increase in her liver numbers over the last three months. Sometimes we see polycythemia in OSA, but typically not mild OSA and not a sudden increase within 3 months. I'm going to send a referral to hematology. Want her to also follow up with her PCP regarding this.  Would hold on the Zepbound until she has further workup for the above. Thanks

## 2024-03-19 ENCOUNTER — Ambulatory Visit: Admitting: Family Medicine

## 2024-03-19 ENCOUNTER — Encounter: Payer: Self-pay | Admitting: Family Medicine

## 2024-03-19 VITALS — BP 116/80 | HR 78 | Temp 98.1°F | Ht 61.0 in | Wt 165.0 lb

## 2024-03-19 DIAGNOSIS — R748 Abnormal levels of other serum enzymes: Secondary | ICD-10-CM | POA: Diagnosis not present

## 2024-03-19 DIAGNOSIS — D751 Secondary polycythemia: Secondary | ICD-10-CM | POA: Diagnosis not present

## 2024-03-19 DIAGNOSIS — E782 Mixed hyperlipidemia: Secondary | ICD-10-CM | POA: Diagnosis not present

## 2024-03-19 NOTE — Patient Instructions (Addendum)
-  It was great to see you today. -Recheck CBC, CMP, and lipid levels for accuracy. Office will call with lab results and will be available on MyChart.  -Please call pulmonary office or send a MyChart message if you do not receive a phone call or a MyChart message about appointment in 2 weeks with hematology. -Recommend to start fish oil supplement and working on diet modifications and participating in regular exercise. Diet encouraged - increase intake of fresh fruits and vegetables, increase intake of lean proteins. Bake, broil, or grill foods. Avoid fried, greasy, and fatty foods. Avoid fast foods. Increase intake of fiber-rich whole grains. Exercise encouraged - at least 150 minutes per week and advance as tolerated.  Concerned if cholesterol medication is started with elevated liver enzymes, it could cause more damage to the liver. Will hold on starting cholesterol medication.  -If liver enzymes are elevated, will recommend to an ultrasound of liver.

## 2024-03-19 NOTE — Progress Notes (Signed)
 Established Patient Office Visit   Subjective:  Patient ID: Rebecca Davies, female    DOB: March 17, 1986  Age: 38 y.o. MRN: [id number]  Chief Complaint  Patient presents with   Medical Management of Chronic Issues    HPI Patient is having a follow up based on recommendation of Comer Rouleau, NP with [facility] at [facility]. She was seen by pulmonary for mild sleep apnea and was going to start Zepbound for weight loss. Patient had a CBC, CMP, and lipid panel drawn on 08/05. Patients had elevated RBC (5.73), HGB (17.2), and HCT (52.3) with history of elevation 3 years ago. Cobb, NP has referred patient to hematology, awaiting on referral to process.   CMP was abnormal with concern of elevated liver enzymes AST (88) and ALT (138). She has previously had an elevated ALT. Back in April, she was negative for acute hepatitis. Last imaging completed on liver was back in 2021 with normal results.   Lipids were elevated with most concerning triglycerides (514), previously had elevated triglycerides 3 and 9 years ago. Since this lab, she has started to change her diet and going to start a fish oil supplement.   ROS See HPI above     Objective:   BP 116/80   Pulse 78   Temp 98.1 F (36.7 C) (Oral)   Ht 5' 1 (1.549 m)   Wt 165 lb (74.8 kg)   SpO2 96%   BMI 31.18 kg/m    Physical Exam Vitals reviewed.  Constitutional:      General: She is not in acute distress.    Appearance: Normal appearance. She is obese. She is not ill-appearing, toxic-appearing or diaphoretic.  HENT:     Head: Normocephalic and atraumatic.  Eyes:     General:        Right eye: No discharge.        Left eye: No discharge.     Conjunctiva/sclera: Conjunctivae normal.  Cardiovascular:     Rate and Rhythm: Normal rate.  Pulmonary:     Effort: Pulmonary effort is normal. No respiratory distress.  Musculoskeletal:        General: Normal range of motion.  Skin:    General: Skin is warm  and dry.  Neurological:     General: No focal deficit present.     Mental Status: She is alert and oriented to person, place, and time. Mental status is at baseline.  Psychiatric:        Mood and Affect: Mood normal.        Behavior: Behavior normal.        Thought Content: Thought content normal.        Judgment: Judgment normal.      Assessment & Plan:  Polycythemia -     CBC with Differential/Platelet  Elevated liver enzymes -     Comprehensive metabolic panel with GFR  Mixed hyperlipidemia -     Lipid panel  -Recheck CBC, CMP, and lipid levels for accuracy. Office will call with lab results and will be available on MyChart.  -Please call pulmonary office or send a MyChart message if you do not receive a phone call or a MyChart message about appointment in 2 weeks with hematology. -Recommend to start fish oil supplement and working on diet modifications and participating in regular exercise. Diet encouraged - increase intake of fresh fruits and vegetables, increase intake of lean proteins. Bake, broil, or grill foods. Avoid fried, greasy, and fatty foods.  Avoid fast foods. Increase intake of fiber-rich whole grains. Exercise encouraged - at least 150 minutes per week and advance as tolerated.  Concerned if cholesterol medication is started with elevated liver enzymes, it could cause more damage to the liver. Will hold on starting cholesterol medication.  -If liver enzymes are elevated, will recommend to an ultrasound of liver.     Idalis Hoelting, NP

## 2024-03-20 ENCOUNTER — Ambulatory Visit: Payer: Self-pay | Admitting: Family Medicine

## 2024-03-20 ENCOUNTER — Other Ambulatory Visit: Payer: Self-pay | Admitting: Medical Genetics

## 2024-03-20 DIAGNOSIS — K760 Fatty (change of) liver, not elsewhere classified: Secondary | ICD-10-CM

## 2024-03-20 LAB — CBC WITH DIFFERENTIAL/PLATELET
Basophils Absolute: 0.1 x10E3/uL (ref 0.0–0.2)
Basos: 1 %
EOS (ABSOLUTE): 0.2 x10E3/uL (ref 0.0–0.4)
Eos: 2 %
Hematocrit: 45.7 % (ref 34.0–46.6)
Hemoglobin: 15.3 g/dL (ref 11.1–15.9)
Immature Grans (Abs): 0 x10E3/uL (ref 0.0–0.1)
Immature Granulocytes: 0 %
Lymphocytes Absolute: 3.1 x10E3/uL (ref 0.7–3.1)
Lymphs: 35 %
MCH: 30.1 pg (ref 26.6–33.0)
MCHC: 33.5 g/dL (ref 31.5–35.7)
MCV: 90 fL (ref 79–97)
Monocytes Absolute: 0.5 x10E3/uL (ref 0.1–0.9)
Monocytes: 5 %
Neutrophils Absolute: 5.1 x10E3/uL (ref 1.4–7.0)
Neutrophils: 57 %
Platelets: 231 x10E3/uL (ref 150–450)
RBC: 5.09 x10E6/uL (ref 3.77–5.28)
RDW: 12.7 % (ref 11.7–15.4)
WBC: 9 x10E3/uL (ref 3.4–10.8)

## 2024-03-20 LAB — LIPID PANEL
Chol/HDL Ratio: 6.8 ratio — ABNORMAL HIGH (ref 0.0–4.4)
Cholesterol, Total: 205 mg/dL — ABNORMAL HIGH (ref 100–199)
HDL: 30 mg/dL — ABNORMAL LOW (ref >39)
LDL Chol Calc (NIH): 99 mg/dL (ref 0–99)
Triglycerides: 453 mg/dL — ABNORMAL HIGH (ref 0–149)
VLDL Cholesterol Cal: 76 mg/dL — ABNORMAL HIGH (ref 5–40)

## 2024-03-20 LAB — COMPREHENSIVE METABOLIC PANEL WITH GFR
ALT: 110 IU/L — ABNORMAL HIGH (ref 0–32)
AST: 68 IU/L — ABNORMAL HIGH (ref 0–40)
Albumin: 4.7 g/dL (ref 3.9–4.9)
Alkaline Phosphatase: 99 IU/L (ref 44–121)
BUN/Creatinine Ratio: 15 (ref 9–23)
BUN: 14 mg/dL (ref 6–20)
Bilirubin Total: 1.6 mg/dL — ABNORMAL HIGH (ref 0.0–1.2)
CO2: 22 mmol/L (ref 20–29)
Calcium: 10.1 mg/dL (ref 8.7–10.2)
Chloride: 100 mmol/L (ref 96–106)
Creatinine, Ser: 0.96 mg/dL (ref 0.57–1.00)
Globulin, Total: 2.6 g/dL (ref 1.5–4.5)
Glucose: 164 mg/dL — ABNORMAL HIGH (ref 70–99)
Potassium: 4.5 mmol/L (ref 3.5–5.2)
Sodium: 139 mmol/L (ref 134–144)
Total Protein: 7.3 g/dL (ref 6.0–8.5)
eGFR: 77 mL/min/1.73 (ref >59)

## 2024-03-20 NOTE — Telephone Encounter (Signed)
 Glad her hgb has normalized! She does have a history of it being slightly elevated, and it tends to stay on the higher end of normal, so if it's elevated on future checks, would follow through with the hematology consult.  LFTs are still elevated so recommend waiting to see liver specialist before starting Zepbound. If it's related to fatty liver disease, then the Zepbound can actually help; however, need to ensure there is not another underlying cause first. Please have her make a follow up with us  after she sees them. Thanks!

## 2024-03-22 ENCOUNTER — Ambulatory Visit: Admitting: Family Medicine

## 2024-03-28 NOTE — Addendum Note (Signed)
 Addended by: ELNER NANNY B on: 03/28/2024 08:13 AM   Modules accepted: Orders

## 2024-04-09 ENCOUNTER — Other Ambulatory Visit

## 2024-05-23 ENCOUNTER — Other Ambulatory Visit

## 2024-05-23 DIAGNOSIS — Z006 Encounter for examination for normal comparison and control in clinical research program: Secondary | ICD-10-CM

## 2024-05-31 LAB — GENECONNECT MOLECULAR SCREEN: Genetic Analysis Overall Interpretation: NEGATIVE

## 2024-06-13 ENCOUNTER — Other Ambulatory Visit: Payer: Self-pay

## 2024-06-13 DIAGNOSIS — K760 Fatty (change of) liver, not elsewhere classified: Secondary | ICD-10-CM

## 2024-07-08 ENCOUNTER — Telehealth: Admitting: Family Medicine

## 2024-07-08 VITALS — Ht 61.0 in | Wt 165.0 lb

## 2024-07-08 DIAGNOSIS — U071 COVID-19: Secondary | ICD-10-CM | POA: Diagnosis not present

## 2024-07-08 DIAGNOSIS — J069 Acute upper respiratory infection, unspecified: Secondary | ICD-10-CM

## 2024-07-08 DIAGNOSIS — U099 Post covid-19 condition, unspecified: Secondary | ICD-10-CM

## 2024-07-08 MED ORDER — AZITHROMYCIN 500 MG PO TABS: 500.0000 mg | ORAL_TABLET | Freq: Every day | ORAL | 0 refills | Status: AC

## 2024-07-08 MED ORDER — BENZONATATE 200 MG PO CAPS: 200.0000 mg | ORAL_CAPSULE | Freq: Two times a day (BID) | ORAL | 0 refills | Status: AC | PRN

## 2024-07-08 MED ORDER — PROMETHAZINE-DM 6.25-15 MG/5ML PO SYRP: 5.0000 mL | ORAL_SOLUTION | Freq: Four times a day (QID) | ORAL | 0 refills | Status: AC | PRN

## 2024-07-08 NOTE — Progress Notes (Unsigned)
   Established Patient Office Visit   Subjective:  Patient ID: Rebecca Davies, female    DOB: [birth year]  Age: 38 y.o. MRN: [id number]  Chief Complaint  Patient presents with   Covid Positive   Cough    HPI Tested positive covid on Wednesday, 26th. Started out like a mild flu like symptoms and transitioned to sinus pressure, cough. Cough varies between non productive and some productive. Greenish-yellow, thick.   Fever since Thursday night.  Denies chest pain or shortness of breath.  ROS See HPI above     Objective:     Ht 5' 1 (1.549 m)   Wt 165 lb (74.8 kg)   BMI 31.18 kg/m  {Vitals History (Optional):23777}  Physical Exam  No results found for any visits on 07/08/24.  The ASCVD Risk score (Arnett DK, et al., 2019) failed to calculate for the following reasons:   The 2019 ASCVD risk score is only valid for ages 80 to 73    Assessment & Plan:  COVID-19  Post-COVID syndrome  Acute upper respiratory infection -     Azithromycin ; Take 1 tablet (500 mg total) by mouth daily for 7 days.  Dispense: 7 tablet; Refill: 0  Other orders -     Promethazine -DM; Take 5 mLs by mouth 4 (four) times daily as needed for up to 7 days.  Dispense: 118 mL; Refill: 0 -     Benzonatate; Take 1 capsule (200 mg total) by mouth 2 (two) times daily as needed for up to 7 days for cough.  Dispense: 14 capsule; Refill: 0    No follow-ups on file.   Paul Torpey, NP

## 2024-07-09 NOTE — Patient Instructions (Addendum)
-  It was a pleasure to care for you. I hope you get to feeling better soon. -Prescribed Azithromycin  500mg  tablet once a day for 7 days. -Prescribed Promethazine -DM cough syrup and Benzonatate for cough with covid.  -Rest, hydrate.  -Follow up if not improved.

## 2024-07-17 ENCOUNTER — Encounter: Payer: Self-pay | Admitting: Family Medicine

## 2024-07-22 NOTE — Telephone Encounter (Signed)
 Scheduled pt telehealth at 10 wednesday pt notified

## 2024-07-24 ENCOUNTER — Encounter: Payer: Self-pay | Admitting: Family Medicine

## 2024-07-24 ENCOUNTER — Telehealth: Admitting: Family Medicine

## 2024-07-24 VITALS — Ht 61.0 in | Wt 165.0 lb

## 2024-07-24 DIAGNOSIS — M542 Cervicalgia: Secondary | ICD-10-CM | POA: Diagnosis not present

## 2024-07-24 MED ORDER — PREDNISONE 10 MG PO TABS: ORAL_TABLET | ORAL | 0 refills | Status: AC

## 2024-07-24 MED ORDER — METHOCARBAMOL 500 MG PO TABS: 500.0000 mg | ORAL_TABLET | Freq: Three times a day (TID) | ORAL | 0 refills | Status: AC | PRN

## 2024-07-24 NOTE — Progress Notes (Signed)
 Virtual Visit via Video Note  I connected with Rebecca Davies on 07/24/2024 at 10:28 AM by a video enabled telemedicine application and verified that I am speaking with the correct person using two identifiers.   Patient Location: Home Provider Location: office - [place].    I discussed the limitations, risks, security and privacy concerns of performing an evaluation and management service by telephone and the availability of in person appointments. I also discussed with the patient that there may be a patient responsible charge related to this service. The patient expressed understanding and agreed to proceed, consent obtained  Chief Complaint  Patient presents with   nerve pain    History of Present Illness: Rebecca Davies is a 38 y.o. female is complaining of neck pain. She is complaining right neck pain. Started 2 weeks ago. She reports she feels like it is a pinched nerve from coughing from previous covid. Reports pain is intermittent, mostly at night time. Pain radiates up to into neck and down right shoulder. Pain is described as dull, aching pain. When pain occurs, more difficultly to turn head to left. When turning head to the right, it is a little tight. Maybe a little numbness  in right shoulder when pain is severe.   She reports she uses a heating pad and taking Ibuprofen  with some relief.   Patient Active Problem List   Diagnosis Date Noted   Mild obstructive sleep apnea 03/08/2024   Class 1 obesity with body mass index (BMI) of 31.0 to 31.9 in adult 03/08/2024   Excessive daytime sleepiness 01/15/2024   Snoring 01/15/2024   Non-recurrent acute serous otitis media of left ear 09/10/2020   Need for influenza vaccination 09/10/2020   Leukocytes in urine 09/10/2020   Swelling of lymph node- left posterior cervical.  09/10/2020   Vitamin D  insufficiency 09/10/2020   Body mass index 27.0-27.9, adult 09/10/2020   Dysuria 04/08/2020   Weakness of both legs 04/08/2020    Nausea and vomiting 04/08/2020   Generalized abdominal pain 04/08/2020   Renal cyst, left 04/08/2020   Leukocytosis 04/08/2020   Anal skin tag    Grade III hemorrhoids    Left hip pain 05/10/2017   Shellfish allergy 11/07/2016   Screening for blood or protein in urine 09/04/2014   Anxiety and depression 09/04/2014   Past Medical History:  Diagnosis Date   Anxiety    Arthritis    FINGER, ELBOW   BRCA negative 06/2013   MyRisk neg   Chicken pox    Depression    Family history of breast cancer    Family history of pancreatic cancer    GERD (gastroesophageal reflux disease)    OCC- NO MEDS   Migraine    H/O MIGRAINES   Vitamin D  deficiency    Past Surgical History:  Procedure Laterality Date   HEMORRHOID SURGERY N/A 04/05/2019   Procedure: HEMORRHOIDECTOMY, EXCISION ANAL SKIN TAG;  Surgeon: Marolyn Nest, MD;  Location: [facility] ORS;  Service: General;  Laterality: N/A;   WISDOM TOOTH EXTRACTION  2013?   Allergies[1] Prior to Admission medications  Medication Sig Start Date End Date Taking? Authorizing Provider  ALPRAZolam  (XANAX ) 0.5 MG tablet Take 1 tablet (0.5 mg total) by mouth at bedtime as needed for anxiety. 06/20/19  Yes Guse, Tinnie HERO, FNP  EPINEPHrine  (EPIPEN  2-PAK) 0.3 mg/0.3 mL IJ SOAJ injection Inject 0.3 mg into the muscle as needed for anaphylaxis. 11/30/23  Yes Billy Philippe SAUNDERS, NP  Vitamin D , Ergocalciferol , (DRISDOL ) 1.25  MG (50000 UNIT) CAPS capsule Take 1 capsule by mouth once a week. 09/27/23  Yes [provider]   Social History   Socioeconomic History   Marital status: Divorced    Spouse name: Not on file   Number of children: 1   Years of education: Not on file   Highest education level: Some college, no degree  Occupational History   Not on file  Tobacco Use   Smoking status: Never   Smokeless tobacco: Never  Vaping Use   Vaping status: Never Used  Substance and Sexual Activity   Alcohol use: Yes    Alcohol/week: 1.0 standard  drink of alcohol    Types: 1 Standard drinks or equivalent per week    Comment: 1 glass of wine every few weeks.    Drug use: No   Sexual activity: Yes    Partners: Male    Birth control/protection: Implant  Other Topics Concern   Not on file  Social History Narrative   From [place] and residing in [place]   1 Son    2 cat and 1 dog in the house   Enjoys movies, going to eat, beauty products    Social Drivers of Health   Tobacco Use: Low Risk (07/24/2024)   Patient History    Smoking Tobacco Use: Never    Smokeless Tobacco Use: Never    Passive Exposure: Not on file  Financial Resource Strain: Medium Risk (07/08/2024)   Overall Financial Resource Strain (CARDIA)    Difficulty of Paying Living Expenses: Somewhat hard  Food Insecurity: No Food Insecurity (07/08/2024)   Epic    Worried About Radiation Protection Practitioner of Food in the Last Year: Never true    Ran Out of Food in the Last Year: Never true  Transportation Needs: No Transportation Needs (07/08/2024)   Epic    Lack of Transportation (Medical): No    Lack of Transportation (Non-Medical): No  Physical Activity: Insufficiently Active (07/08/2024)   Exercise Vital Sign    Days of Exercise per Week: 1 day    Minutes of Exercise per Session: 10 min  Stress: Stress Concern Present (07/08/2024)   [employer] of Occupational Health - Occupational Stress Questionnaire    Feeling of Stress: To some extent  Social Connections: Socially Isolated (07/08/2024)   Social Connection and Isolation Panel    Frequency of Communication with Friends and Family: Three times a week    Frequency of Social Gatherings with Friends and Family: Never    Attends Religious Services: Never    Database Administrator or Organizations: No    Attends Engineer, Structural: Not on file    Marital Status: Divorced  Intimate Partner Violence: Not At Risk (11/30/2023)   Humiliation, Afraid, Rape, and Kick questionnaire    Fear of Current or Ex-Partner:  No    Emotionally Abused: No    Physically Abused: No    Sexually Abused: No  Depression (PHQ2-9): Low Risk (07/24/2024)   Depression (PHQ2-9)    PHQ-2 Score: 4  Alcohol Screen: Low Risk (07/08/2024)   Alcohol Screen    Last Alcohol Screening Score (AUDIT): 1  Housing: Low Risk (07/08/2024)   Epic    Unable to Pay for Housing in the Last Year: No    Number of Times Moved in the Last Year: 0    Homeless in the Last Year: No  Utilities: Low Risk (09/26/2023)   Received from [facility]   Utilities    In the  past 12 months has the electric, gas, oil, or water company threatened to shut off services in your home? : No  Health Literacy: Adequate Health Literacy (11/30/2023)   B1300 Health Literacy    Frequency of need for help with medical instructions: Never    Observations/Objective: Today's Vitals   07/24/24 1010  Weight: 165 lb (74.8 kg)  Height: 5' 1 (1.549 m)  Unable to obtain vital signs since she is not have equipment.  Physical Exam Vitals reviewed.  Constitutional:      General: She is not in acute distress.    Appearance: Normal appearance. She is obese. She is not ill-appearing, toxic-appearing or diaphoretic.  HENT:     Head: Normocephalic and atraumatic.  Eyes:     General:        Right eye: No discharge.        Left eye: No discharge.     Conjunctiva/sclera: Conjunctivae normal.  Pulmonary:     Effort: Pulmonary effort is normal. No respiratory distress.  Musculoskeletal:     Cervical back: Decreased range of motion.  Skin:    General: Skin is dry.  Neurological:     General: No focal deficit present.     Mental Status: She is alert and oriented to person, place, and time. Mental status is at baseline.  Psychiatric:        Mood and Affect: Mood normal.        Behavior: Behavior normal.        Thought Content: Thought content normal.        Judgment: Judgment normal.     Assessment and Plan: Cervical pain -     Methocarbamol ; Take 1 tablet (500 mg  total) by mouth every 8 (eight) hours as needed for up to 7 days.  Dispense: 15 tablet; Refill: 0 -     predniSONE ; Take 6 tablets (60 mg total) by mouth daily with breakfast for 1 day, THEN 5 tablets (50 mg total) daily with breakfast for 1 day, THEN 4 tablets (40 mg total) daily with breakfast for 1 day, THEN 3 tablets (30 mg total) daily with breakfast for 1 day, THEN 2 tablets (20 mg total) daily with breakfast for 1 day, THEN 1 tablet (10 mg total) daily with breakfast for 1 day.  Dispense: 21 tablet; Refill: 0  -Prescribed Robaxin  500mg  tablet, take 1 tablet every 8 hours as needed for pain. Caution: Medication can cause drowsiness. -Prescribed Prednisone  10mg , 6 day taper dose pack. Caution: Do not take Ibuprofen , Advil , Aleve, or Naproxen.  -Continue to use heat compresses to help relief symptoms. -Follow up if not improved.   Follow Up Instructions: If not improved   I discussed the assessment and treatment plan with the patient. The patient was provided an opportunity to ask questions and all were answered. The patient agreed with the plan and demonstrated an understanding of the instructions.   The patient was advised to call back or seek an in-person evaluation if the symptoms worsen or if the condition fails to improve as anticipated.  Rivka Baune, NP    [1]  Allergies Allergen Reactions   Betadine [Povidone Iodine] Rash   Shellfish Allergy Swelling and Rash

## 2024-07-24 NOTE — Patient Instructions (Addendum)
-  It was good to see you today! -Prescribed Robaxin  500mg  tablet, take 1 tablet every 8 hours as needed for pain. Caution: Medication can cause drowsiness. -Prescribed Prednisone  10mg , 6 day taper dose pack. Caution: Do not take Ibuprofen , Advil , Aleve, or Naproxen.  -Continue to use heat compresses to help relief symptoms. -Follow up if not improved.

## 2024-09-04 ENCOUNTER — Encounter: Payer: Self-pay | Admitting: Family Medicine

## 2024-09-06 ENCOUNTER — Ambulatory Visit: Admitting: Family Medicine

## 2024-09-06 ENCOUNTER — Encounter: Payer: Self-pay | Admitting: Family Medicine

## 2024-09-06 VITALS — BP 120/84 | HR 100 | Temp 97.8°F | Ht 61.0 in | Wt 166.0 lb

## 2024-09-06 DIAGNOSIS — E66811 Obesity, class 1: Secondary | ICD-10-CM | POA: Diagnosis not present

## 2024-09-06 DIAGNOSIS — G4733 Obstructive sleep apnea (adult) (pediatric): Secondary | ICD-10-CM

## 2024-09-06 DIAGNOSIS — Z6831 Body mass index (BMI) 31.0-31.9, adult: Secondary | ICD-10-CM

## 2024-09-06 DIAGNOSIS — E6609 Other obesity due to excess calories: Secondary | ICD-10-CM

## 2024-09-06 DIAGNOSIS — K760 Fatty (change of) liver, not elsewhere classified: Secondary | ICD-10-CM

## 2024-09-06 MED ORDER — ZEPBOUND 2.5 MG/0.5ML ~~LOC~~ SOAJ: 2.5000 mg | SUBCUTANEOUS | 0 refills | Status: AC

## 2024-09-06 NOTE — Assessment & Plan Note (Addendum)
 Weight today in office 166 lbs. Start Zepbound  2.5mg  weekly injections.

## 2024-09-06 NOTE — Progress Notes (Signed)
 "  Established Patient Office Visit   Subjective:  Patient ID: Rebecca Davies, female    DOB: 09-May-1986  Age: 39 y.o. MRN: [id number]  Chief Complaint  Patient presents with   Medical Management of Chronic Issues    Zepbound     Rebecca Davies presents to the clinic today to discuss weight loss treatment with Zepbound . Has not tried any weight loss medications in the past, her pulmonologist recommended to help reduce or eliminate her sleep apnea. She had a sleep study that showed mild sleep apnea. Denies any personal or family history of thyroid  cancer.  Also, she has been seen by [facility] for MASLD on 07/24/2024. They encouraged weight loss and pharmacologic management of metabolic syndrome to help with disease process.   HPI Review of Systems  Constitutional: Negative.   HENT: Negative.    Eyes: Negative.   Respiratory: Negative.    Cardiovascular: Negative.   Gastrointestinal: Negative.   Genitourinary: Negative.   Musculoskeletal: Negative.   Skin: Negative.   Neurological: Negative.   Psychiatric/Behavioral: Negative.      Objective:    BP 120/84   Pulse 100   Temp 97.8 F (36.6 C) (Oral)   Ht 5' 1 (1.549 m)   Wt 166 lb (75.3 kg)   SpO2 97%   BMI 31.37 kg/m  BP Readings from Last 3 Encounters:  09/06/24 120/84  03/19/24 116/80  01/15/24 (!) 121/95   Wt Readings from Last 3 Encounters:  09/06/24 166 lb (75.3 kg)  07/24/24 165 lb (74.8 kg)  07/08/24 165 lb (74.8 kg)   Physical Exam Constitutional:      Appearance: Normal appearance. She is obese.  Cardiovascular:     Rate and Rhythm: Normal rate and regular rhythm.     Heart sounds: Normal heart sounds.  Pulmonary:     Effort: Pulmonary effort is normal.     Breath sounds: Normal breath sounds.  Skin:    General: Skin is warm and dry.  Neurological:     General: No focal deficit present.     Mental Status: She is alert and oriented to person, place, and time. Mental status is at  baseline.  Psychiatric:        Mood and Affect: Mood normal.        Behavior: Behavior normal.        Thought Content: Thought content normal.        Judgment: Judgment normal.      Assessment & Plan:  Class 1 obesity due to excess calories with body mass index (BMI) of 31.0 to 31.9 in adult, unspecified whether serious comorbidity present Assessment & Plan: Weight today in office 166 lbs. Start Zepbound  2.5mg  weekly injections.  Orders: -     Zepbound ; Inject 2.5 mg into the skin once a week for 4 doses.  Dispense: 2 mL; Refill: 0  Obstructive sleep apnea syndrome -     Zepbound ; Inject 2.5 mg into the skin once a week for 4 doses.  Dispense: 2 mL; Refill: 0  Hepatic steatosis -     Zepbound ; Inject 2.5 mg into the skin once a week for 4 doses.  Dispense: 2 mL; Refill: 0   -Discussed weight loss treatment and management. -Continue to modify lifestyle with healthy diet and exercise. -Start Zepbound  2.5mg  weekly injections. Common side effects and ways to help with these. If doing well on 2.5mg , will increase to 5mg .  -Follow up telehealth in 1 month.  JoAnna Williamson,  NP  "

## 2024-09-06 NOTE — Patient Instructions (Addendum)
 It was nice to see you today! Today we: -Discussed weight loss treatment and management. -Continue to modify lifestyle with healthy diet and exercise. -Start Zepbound  2.5mg  weekly injections. Common side effects and ways to help with these. If doing well on 2.5mg , will increase to 5mg .  -Follow up telehealth in 1 month.

## 2024-09-12 ENCOUNTER — Encounter: Payer: Self-pay | Admitting: Family Medicine

## 2024-10-04 ENCOUNTER — Ambulatory Visit: Admitting: Family Medicine

## 2024-12-02 ENCOUNTER — Encounter: Admitting: Family Medicine
